# Patient Record
Sex: Female | Born: 1955 | Race: White | Hispanic: No | Marital: Married | Smoking: Former smoker
Health system: Southern US, Community
[De-identification: ages and names within clinical notes are randomized; demographics above are authoritative.]

## PROBLEM LIST (undated history)

## (undated) DIAGNOSIS — R51 Headache: Secondary | ICD-10-CM

## (undated) DIAGNOSIS — E785 Hyperlipidemia, unspecified: Secondary | ICD-10-CM

## (undated) DIAGNOSIS — Z8669 Personal history of other diseases of the nervous system and sense organs: Secondary | ICD-10-CM

## (undated) DIAGNOSIS — C801 Malignant (primary) neoplasm, unspecified: Secondary | ICD-10-CM

## (undated) DIAGNOSIS — G5600 Carpal tunnel syndrome, unspecified upper limb: Secondary | ICD-10-CM

## (undated) DIAGNOSIS — M1990 Unspecified osteoarthritis, unspecified site: Secondary | ICD-10-CM

## (undated) DIAGNOSIS — M2550 Pain in unspecified joint: Secondary | ICD-10-CM

## (undated) DIAGNOSIS — G4700 Insomnia, unspecified: Secondary | ICD-10-CM

## (undated) DIAGNOSIS — M2540 Effusion, unspecified joint: Secondary | ICD-10-CM

## (undated) DIAGNOSIS — J302 Other seasonal allergic rhinitis: Secondary | ICD-10-CM

## (undated) DIAGNOSIS — Z9889 Other specified postprocedural states: Secondary | ICD-10-CM

## (undated) HISTORY — PX: COLONOSCOPY: SHX174

## (undated) HISTORY — PX: BREAST EXCISIONAL BIOPSY: SUR124

## (undated) HISTORY — PX: OTHER SURGICAL HISTORY: SHX169

---
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
## 1976-07-03 DIAGNOSIS — C801 Malignant (primary) neoplasm, unspecified: Secondary | ICD-10-CM

## 1976-07-03 HISTORY — PX: ABDOMINAL HYSTERECTOMY: SHX81

## 1976-07-03 HISTORY — DX: Malignant (primary) neoplasm, unspecified: C80.1

## 1989-07-03 HISTORY — PX: OTHER SURGICAL HISTORY: SHX169

## 1997-07-03 HISTORY — PX: OTHER SURGICAL HISTORY: SHX169

## 1998-05-24 ENCOUNTER — Other Ambulatory Visit: Admission: RE | Admit: 1998-05-24 | Discharge: 1998-05-24 | Payer: Self-pay | Admitting: Obstetrics and Gynecology

## 1999-06-13 ENCOUNTER — Encounter: Admission: RE | Admit: 1999-06-13 | Discharge: 1999-06-13 | Payer: Self-pay | Admitting: *Deleted

## 1999-06-13 ENCOUNTER — Encounter: Payer: Self-pay | Admitting: *Deleted

## 1999-06-22 ENCOUNTER — Other Ambulatory Visit: Admission: RE | Admit: 1999-06-22 | Discharge: 1999-06-22 | Payer: Self-pay | Admitting: *Deleted

## 2000-06-18 ENCOUNTER — Encounter: Admission: RE | Admit: 2000-06-18 | Discharge: 2000-06-18 | Payer: Self-pay | Admitting: *Deleted

## 2000-06-18 ENCOUNTER — Encounter: Payer: Self-pay | Admitting: *Deleted

## 2000-07-27 ENCOUNTER — Other Ambulatory Visit: Admission: RE | Admit: 2000-07-27 | Discharge: 2000-07-27 | Payer: Self-pay | Admitting: *Deleted

## 2001-06-20 ENCOUNTER — Encounter: Payer: Self-pay | Admitting: *Deleted

## 2001-06-20 ENCOUNTER — Encounter: Admission: RE | Admit: 2001-06-20 | Discharge: 2001-06-20 | Payer: Self-pay | Admitting: *Deleted

## 2001-08-05 ENCOUNTER — Other Ambulatory Visit: Admission: RE | Admit: 2001-08-05 | Discharge: 2001-08-05 | Payer: Self-pay | Admitting: Obstetrics and Gynecology

## 2002-03-25 ENCOUNTER — Encounter: Admission: RE | Admit: 2002-03-25 | Discharge: 2002-03-25 | Payer: Self-pay | Admitting: *Deleted

## 2002-03-25 ENCOUNTER — Encounter: Payer: Self-pay | Admitting: *Deleted

## 2002-08-11 ENCOUNTER — Other Ambulatory Visit: Admission: RE | Admit: 2002-08-11 | Discharge: 2002-08-11 | Payer: Self-pay | Admitting: Obstetrics and Gynecology

## 2003-03-30 ENCOUNTER — Encounter: Admission: RE | Admit: 2003-03-30 | Discharge: 2003-03-30 | Payer: Self-pay | Admitting: *Deleted

## 2003-03-30 ENCOUNTER — Encounter: Payer: Self-pay | Admitting: *Deleted

## 2003-08-17 ENCOUNTER — Other Ambulatory Visit: Admission: RE | Admit: 2003-08-17 | Discharge: 2003-08-17 | Payer: Self-pay | Admitting: Obstetrics and Gynecology

## 2004-01-07 ENCOUNTER — Ambulatory Visit (HOSPITAL_COMMUNITY): Admission: RE | Admit: 2004-01-07 | Discharge: 2004-01-07 | Payer: Self-pay | Admitting: Family Medicine

## 2004-03-30 ENCOUNTER — Encounter: Admission: RE | Admit: 2004-03-30 | Discharge: 2004-03-30 | Payer: Self-pay | Admitting: *Deleted

## 2005-04-21 ENCOUNTER — Encounter: Admission: RE | Admit: 2005-04-21 | Discharge: 2005-04-21 | Payer: Self-pay | Admitting: Obstetrics and Gynecology

## 2005-05-12 ENCOUNTER — Ambulatory Visit (HOSPITAL_COMMUNITY): Admission: RE | Admit: 2005-05-12 | Discharge: 2005-05-12 | Payer: Self-pay | Admitting: Family Medicine

## 2005-09-07 ENCOUNTER — Ambulatory Visit (HOSPITAL_COMMUNITY): Admission: RE | Admit: 2005-09-07 | Discharge: 2005-09-07 | Payer: Self-pay | Admitting: Family Medicine

## 2005-09-11 ENCOUNTER — Other Ambulatory Visit: Admission: RE | Admit: 2005-09-11 | Discharge: 2005-09-11 | Payer: Self-pay | Admitting: Obstetrics and Gynecology

## 2006-04-24 ENCOUNTER — Encounter: Admission: RE | Admit: 2006-04-24 | Discharge: 2006-04-24 | Payer: Self-pay | Admitting: Obstetrics and Gynecology

## 2006-06-29 ENCOUNTER — Encounter: Admission: RE | Admit: 2006-06-29 | Discharge: 2006-06-29 | Payer: Self-pay | Admitting: Orthopedic Surgery

## 2006-07-03 HISTORY — PX: OTHER SURGICAL HISTORY: SHX169

## 2006-07-03 HISTORY — PX: APPENDECTOMY: SHX54

## 2006-11-05 ENCOUNTER — Encounter: Payer: Self-pay | Admitting: Obstetrics and Gynecology

## 2006-11-05 ENCOUNTER — Observation Stay (HOSPITAL_COMMUNITY): Admission: EM | Admit: 2006-11-05 | Discharge: 2006-11-06 | Payer: Self-pay | Admitting: Emergency Medicine

## 2006-11-05 ENCOUNTER — Encounter (INDEPENDENT_AMBULATORY_CARE_PROVIDER_SITE_OTHER): Payer: Self-pay | Admitting: Specialist

## 2007-04-26 ENCOUNTER — Encounter: Admission: RE | Admit: 2007-04-26 | Discharge: 2007-04-26 | Payer: Self-pay | Admitting: Obstetrics and Gynecology

## 2007-07-04 HISTORY — PX: BREAST SURGERY: SHX581

## 2007-07-04 HISTORY — PX: BREAST EXCISIONAL BIOPSY: SUR124

## 2007-11-04 ENCOUNTER — Encounter: Admission: RE | Admit: 2007-11-04 | Discharge: 2007-11-04 | Payer: Self-pay | Admitting: Otolaryngology

## 2008-04-28 ENCOUNTER — Encounter: Admission: RE | Admit: 2008-04-28 | Discharge: 2008-04-28 | Payer: Self-pay | Admitting: Obstetrics and Gynecology

## 2009-04-29 ENCOUNTER — Encounter: Admission: RE | Admit: 2009-04-29 | Discharge: 2009-04-29 | Payer: Self-pay | Admitting: Obstetrics and Gynecology

## 2009-08-06 ENCOUNTER — Ambulatory Visit (HOSPITAL_COMMUNITY): Admission: RE | Admit: 2009-08-06 | Discharge: 2009-08-06 | Payer: Self-pay | Admitting: Family Medicine

## 2010-05-04 ENCOUNTER — Encounter: Admission: RE | Admit: 2010-05-04 | Discharge: 2010-05-04 | Payer: Self-pay | Admitting: Obstetrics and Gynecology

## 2010-07-03 HISTORY — PX: OTHER SURGICAL HISTORY: SHX169

## 2010-11-01 ENCOUNTER — Ambulatory Visit (HOSPITAL_COMMUNITY)
Admission: RE | Admit: 2010-11-01 | Discharge: 2010-11-01 | Disposition: A | Discharge status: Home / Self Care | Payer: Medicare PPO | Source: Ambulatory Visit | Attending: Orthopedic Surgery | Admitting: Orthopedic Surgery

## 2010-11-01 DIAGNOSIS — M25669 Stiffness of unspecified knee, not elsewhere classified: Secondary | ICD-10-CM | POA: Insufficient documentation

## 2010-11-01 DIAGNOSIS — M6281 Muscle weakness (generalized): Secondary | ICD-10-CM | POA: Insufficient documentation

## 2010-11-01 DIAGNOSIS — R262 Difficulty in walking, not elsewhere classified: Secondary | ICD-10-CM | POA: Insufficient documentation

## 2010-11-01 DIAGNOSIS — M25569 Pain in unspecified knee: Secondary | ICD-10-CM | POA: Insufficient documentation

## 2010-11-01 DIAGNOSIS — M25559 Pain in unspecified hip: Secondary | ICD-10-CM | POA: Insufficient documentation

## 2010-11-01 DIAGNOSIS — IMO0001 Reserved for inherently not codable concepts without codable children: Secondary | ICD-10-CM | POA: Insufficient documentation

## 2010-11-02 ENCOUNTER — Ambulatory Visit (HOSPITAL_COMMUNITY): Payer: Self-pay | Admitting: Physical Therapy

## 2010-11-04 ENCOUNTER — Ambulatory Visit (HOSPITAL_COMMUNITY)
Admission: RE | Admit: 2010-11-04 | Discharge: 2010-11-04 | Disposition: A | Discharge status: Home / Self Care | Payer: Medicare PPO | Source: Ambulatory Visit | Attending: Family Medicine | Admitting: Family Medicine

## 2010-11-08 ENCOUNTER — Ambulatory Visit (HOSPITAL_COMMUNITY)
Admission: RE | Admit: 2010-11-08 | Discharge: 2010-11-08 | Disposition: A | Discharge status: Home / Self Care | Payer: Medicare PPO | Source: Ambulatory Visit | Attending: Family Medicine | Admitting: Family Medicine

## 2010-11-08 ENCOUNTER — Ambulatory Visit (HOSPITAL_COMMUNITY): Payer: Medicare PPO | Admitting: Physical Therapy

## 2010-11-09 ENCOUNTER — Ambulatory Visit (HOSPITAL_COMMUNITY): Payer: Medicare PPO | Admitting: Physical Therapy

## 2010-11-10 ENCOUNTER — Ambulatory Visit (HOSPITAL_COMMUNITY)
Admission: RE | Admit: 2010-11-10 | Discharge: 2010-11-10 | Disposition: A | Discharge status: Home / Self Care | Payer: Medicare PPO | Source: Ambulatory Visit | Attending: Orthopedic Surgery | Admitting: Orthopedic Surgery

## 2010-11-11 ENCOUNTER — Ambulatory Visit (HOSPITAL_COMMUNITY)
Admission: RE | Admit: 2010-11-11 | Discharge: 2010-11-11 | Disposition: A | Discharge status: Home / Self Care | Payer: Medicare PPO | Source: Ambulatory Visit | Attending: Orthopedic Surgery | Admitting: Orthopedic Surgery

## 2010-11-14 ENCOUNTER — Ambulatory Visit (HOSPITAL_COMMUNITY)
Admission: RE | Admit: 2010-11-14 | Discharge: 2010-11-14 | Disposition: A | Discharge status: Home / Self Care | Payer: Medicare PPO | Source: Ambulatory Visit | Attending: Family Medicine | Admitting: Family Medicine

## 2010-11-14 ENCOUNTER — Ambulatory Visit (HOSPITAL_COMMUNITY): Payer: Medicare PPO | Admitting: Physical Therapy

## 2010-11-16 ENCOUNTER — Ambulatory Visit (HOSPITAL_COMMUNITY): Payer: Medicare PPO | Admitting: Physical Therapy

## 2010-11-18 ENCOUNTER — Ambulatory Visit (HOSPITAL_COMMUNITY): Payer: Medicare PPO

## 2010-11-18 ENCOUNTER — Ambulatory Visit (HOSPITAL_COMMUNITY)
Admission: RE | Admit: 2010-11-18 | Discharge: 2010-11-18 | Disposition: A | Discharge status: Home / Self Care | Payer: Medicare PPO | Source: Ambulatory Visit | Attending: Family Medicine | Admitting: Family Medicine

## 2010-11-18 NOTE — Op Note (Signed)
NAMEWILLETTA, [name]              ACCOUNT NO.:  [id number]   MEDICAL RECORD NO.:  [id number]          PATIENT TYPE:  INP   LOCATION:  1341                         FACILITY:  [facility]   PHYSICIAN:  Sandria Bales. Ezzard Standing, M.D.  DATE OF BIRTH:  05-14-1956   DATE OF PROCEDURE:  11/05/2006  DATE OF DISCHARGE:                               OPERATIVE REPORT   PREOPERATIVE DIAGNOSIS:  Questionable appendicitis.   POSTOPERATIVE DIAGNOSIS:  Normal appendix with appendix scarred to right  ovary, the cecum adhered along right lateral abdominal wall, no obvious  abscess or inflammation.   PROCEDURE PERFORMED:  Laparoscopic appendectomy and enterolysis of  adhesions.   SURGEON:  Ovidio Kin, MD.   FIRST ASSISTANT:  None.   ANESTHESIA:  General endotracheal.   ESTIMATED BLOOD LOSS:  Minimal.   INDICATIONS FOR PROCEDURE:  The patient is a 55 year old white female  patient of Dr. Dennie Bible, who developed right lower quadrant  abdominal pain lasting about 3 days. She has had no real GI symptoms.  She had a CT scan today at [facility] and has what looks like a  focal inflammation with air in a pocket in the right lateral pelvic  brim. It is hard to tell on CT whether it is appendicitis or some other  pathology.   I discussed with the patient the options of observation versus surgery.  I think the patient would be best served by undergoing a laparoscopic  examination to make sure there is no obvious abscess or infection. I  discussed with her indication, potential complications. The potential  complications include, but are not limited to: bleeding, infection,  bowel injury and the possibility of open surgery.   PROCEDURE IN DETAIL:  The patient placed in the supine position. Her  left arm was tucked. She had a Foley catheter in place. She was given a  gram of cefoxitin at the initiation of the procedure. Her abdomen was  painted with Betadine solution and sterilely draped.   An  infraumbilical incision was made, with sharp dissection carried down  to the abdominal cavity. A 0-degree, 10-mm laparoscope was inserted  through a 12-mm Hassan trocar and the [facility] trocar secured with 0  Vicryl sutures. I made the 5-mm trocar on the right upper quadrant and a  10-mm trocar in the left lower quadrant and I then accessed the  abdominal cavity.   Her right and left lobes of the liver were unremarkable. Her gallbladder  had some scarring to it. It was otherwise unremarkable. He stomach was  unremarkable. In advancing the scope to the pelvis, she had her distal  right colon and cecum stuck along the right lateral pelvic wall with old  adhesions. There was no infection, inflammation or evidence of acute  inflammation. I then identified her appendix and traced it down to the  tip of the appendix to right on top of the right ovarian complex which  appeared atrophic. I took this off with a harmonic scalpel, then took  the mesentery of the appendix down to the base.   I then divided the appendix using a vascular  load of the Endo GIA 45-mm  Ethicon stapler. I placed the appendix in an Endo catch bag and  delivered it through the umbilicus. I thought the appendix was grossly  normal appearing except for the adhesions.   I then turned my attention to around the right ovary. I was able to  mobilize this complex and flip it back and forth. There was no evidence  of any infection, inflammation, or abscess. I was then able to mobilize  the small bowel, which actually was stuck down towards the vaginal cuff,  I took these adhesions down, was able to mobilize the small bowel up. I  could examine the whole right pelvic sidewall where this questionable  air was seen on the CT scan. I could see no evidence of anything that  looked like an abscess or a perforation around the small bowel, back at  least 4 or 5 feet, trying to make sure there was no evidence of a  Meckel's of the small  bowel. There was no evidence of any Crohn. The  small bowel had a gross normal appearance externally.   I saw no inflammatory cause of her pain. Again, I saw no abscess, no  infection or abnormality seen on the CT scan, I thought I had done a  thorough job of looking both at the cecum, the appendix which was now  gone, the terminal ileum and the right ovary.   I then irrigated with about 500 mL of saline. I removed my trocars in  turn. There was no bleeding at the trocar site. The umbilical port was  closed with a 0 Vicryl suture. The skin site was closed with 5-0  Monocryl suture, painted with tincture of benzoin and Steri-Strips.  Sponge and needle count were correct at the end of the case.   The patient tolerated the procedure well, was transported to the  recovery room in good condition.   Sponge and needle counts were correct at the end of the case.      Sandria Bales. Ezzard Standing, M.D.  Electronically Signed     DHN/MEDQ  D:  11/05/2006  T:  11/06/2006  Job:  [id number]   cc:   Marcelino Duster L. Vincente Poli, M.D.  Fax: [phone]   Patrica Duel, M.D.  Fax: [phone]

## 2010-11-18 NOTE — H&P (Signed)
[name], [name]              ACCOUNT NO.:  [id number]   MEDICAL RECORD NO.:  [id number]           PATIENT TYPE:   LOCATION:                               FACILITY:  [facility]   PHYSICIAN:  Sandria Bales. Ezzard Standing, M.D.  DATE OF BIRTH:  [birth year]   DATE OF ADMISSION:  11/05/2006  DATE OF DISCHARGE:                              HISTORY & PHYSICAL   HISTORY OF PRESENT ILLNESS:  This is a 55 year old white female who is a  patient of Dr. Elpidio Eric in [place].  She sees Dr. Marcelle Overlie in [place] from a GYN standpoint.  She developed right lower  quadrant pain on Friday, Nov 02, 2006.  She kind of tolerated her pain  through the weekend.  She had no nausea, no vomiting.  It hurts a little  bit more standing up than laying down flat.  She has had no fever.   She denies any history of peptic ulcer disease, liver disease,  gallbladder disease, pancreatic disease, or colon disease, and she had a  negative colonoscopy by Dr. Doy Mince in January of 2007.   PAST MEDICAL HISTORY:   ALLERGIES:  She has no allergies.   CURRENT MEDICATIONS:  1. Lyrica 75 mg b.i.d.  2. Trazodone 25 mg q.h.s.  3. Topamax 50 mg b.i.d. for migraine headaches.  4. Simvastatin 25 mg q.h.s. for cholesterol.  5. Enablex 15 mg daily for incontinence of urine.  6. On at least one note, she is on Macrobid.   PAST SURGICAL HISTORY:  1. She had a hysterectomy for cervical cancer in her 21s.  She does      have one child.  She did not get radiation.  She did not have her      ovaries removed.  2. She has had six knee operations on her right knee by Dr. Chaney Malling,      the most recent, I think in 1998.  She does have a cane she walks      with.  3. She has had a benign left breast biopsy by an unknown surgeon in      town in 1998.  4. She had a bladder tack by Dr. Alexis Frock in 2000.  5. On August 22, 2006 she had an operation for right hand/scaphoid      by Karlyne Greenspan of [facility].   REVIEW OF SYSTEMS:  NEUROLOGIC:  She has had migraine headaches for a  couple years, but the Topamax seems to do a really good job of  controlling those symptoms and she does not see a neurologist on any  regular basis.  PULMONARY:  She quit smoking some 15 years ago.  She has no lung  disease.  CARDIAC:  She has no heart disease, chest pain, or hypertension.  GASTROINTESTINAL:  See history of present illness.  UROLOGIC:  She has this bladder tack and still has some trouble with  urinary incontinence, it sounds like.   SOCIAL HISTORY:  She does not work.  Her husband is with her in the  emergency room.   PHYSICAL EXAMINATION:  VITAL SIGNS:  Her temperature is 97.1, blood  pressure 110/64, pulse 72, respirations 18.  GENERAL:  She is a well-nourished white female, alert and cooperative  with physical exam.  HEENT:  Unremarkable.  NECK:  Supple without masses or thyromegaly.  LUNGS:  Clear to auscultation with symmetric breath sounds.  HEART:  Heart has a regular rate and rhythm without murmur or rub.  BREASTS:  I did not examine her breasts.  ABDOMEN:  Soft.  She has active bowel sounds.  She has a well-healed  Pfannenstiel scar from prior hysterectomy.  She has no organomegaly.  No  hernia.  She is tender in her right lower quadrant,  in McBurney's area.  She has no guarding or rebound or peritoneal signs.  RECTAL:  I did not do a rectal exam  EXTREMITIES:  Has good strength all 4 extremities.  NEUROLOGIC:  Grossly intact.   Dr. Vincente Poli did talk to me on the phone about her impressions and  concerns about possible appendix and she was originally seen at [facility],  where she sent her over to [facility].   DIAGNOSES:  1. Right lower quadrant abdominal pain, possible appendicitis.  I      spoke to her and her husband about the options for continued      observation, possibly antibiotics, versus surgery.  I told them      that I thought there was a 60-70% chance this is  appendiciitis, but      there may be as much as a third of a chance that this may be not      appendicitis, but some other diagnosis, such as small bowel      perforation versus an ovarian problem.  The patient's sister died      of lung cancer.  She is extremely concerned about malignancy but I      do not see any evidence of malignancy on the CT scan or my physical      exam.   I told them that the options would be a laparoscope or possibly an open  surgery, probably would be 5-10%.  It depends on how much scar tissue  and adhesions she has from her prior operation.  I would take her  appendix out anyway.   She also understands she may get some bowel resected if I find some  other peculiar injury.   1. Recent hand surgery of her right hand.  2. Migraine headaches, well-controlled on Topamax.  3. Hypercholesterolemia.  4. Urinary incontinence.  5. Chronic right knee problems, followed by Dr. Dewaine Conger.      Sandria Bales. Ezzard Standing, M.D.  Electronically Signed     DHN/MEDQ  D:  11/05/2006  T:  11/05/2006  Job:  [id number]   cc:   Patrica Duel, M.D.  Fax: [phone]   Stann Mainland. Vincente Poli, M.D.  Fax: [phone]   Shirley Friar, MD  Fax: [phone]   Lenard Galloway. Chaney Malling, M.D.  Fax: [phone]

## 2010-11-21 ENCOUNTER — Ambulatory Visit (HOSPITAL_COMMUNITY)
Admission: RE | Admit: 2010-11-21 | Discharge: 2010-11-21 | Disposition: A | Discharge status: Home / Self Care | Payer: Medicare PPO | Source: Ambulatory Visit | Attending: Family Medicine | Admitting: Family Medicine

## 2010-11-23 ENCOUNTER — Ambulatory Visit (HOSPITAL_COMMUNITY)
Admission: RE | Admit: 2010-11-23 | Discharge: 2010-11-23 | Disposition: A | Discharge status: Home / Self Care | Payer: Medicare PPO | Source: Ambulatory Visit | Attending: Family Medicine | Admitting: Family Medicine

## 2010-11-25 ENCOUNTER — Ambulatory Visit (HOSPITAL_COMMUNITY)
Admission: RE | Admit: 2010-11-25 | Discharge: 2010-11-25 | Disposition: A | Discharge status: Home / Self Care | Payer: Medicare PPO | Source: Ambulatory Visit | Attending: Family Medicine | Admitting: Family Medicine

## 2010-11-25 DIAGNOSIS — M25569 Pain in unspecified knee: Secondary | ICD-10-CM | POA: Insufficient documentation

## 2010-11-25 DIAGNOSIS — R262 Difficulty in walking, not elsewhere classified: Secondary | ICD-10-CM | POA: Insufficient documentation

## 2010-11-25 DIAGNOSIS — M25559 Pain in unspecified hip: Secondary | ICD-10-CM | POA: Insufficient documentation

## 2010-11-25 DIAGNOSIS — IMO0001 Reserved for inherently not codable concepts without codable children: Secondary | ICD-10-CM | POA: Insufficient documentation

## 2010-11-25 DIAGNOSIS — M6281 Muscle weakness (generalized): Secondary | ICD-10-CM | POA: Insufficient documentation

## 2010-11-25 DIAGNOSIS — M25669 Stiffness of unspecified knee, not elsewhere classified: Secondary | ICD-10-CM | POA: Insufficient documentation

## 2010-11-29 ENCOUNTER — Ambulatory Visit (HOSPITAL_COMMUNITY)
Admission: RE | Admit: 2010-11-29 | Discharge: 2010-11-29 | Disposition: A | Discharge status: Home / Self Care | Payer: Medicare PPO | Source: Ambulatory Visit | Attending: Family Medicine | Admitting: Family Medicine

## 2010-12-01 ENCOUNTER — Ambulatory Visit (HOSPITAL_COMMUNITY): Payer: Medicare PPO | Admitting: Physical Therapy

## 2010-12-02 ENCOUNTER — Ambulatory Visit (HOSPITAL_COMMUNITY)
Admission: RE | Admit: 2010-12-02 | Discharge: 2010-12-02 | Disposition: A | Discharge status: Home / Self Care | Payer: Medicare PPO | Source: Ambulatory Visit | Attending: Family Medicine | Admitting: Family Medicine

## 2010-12-02 DIAGNOSIS — M6281 Muscle weakness (generalized): Secondary | ICD-10-CM | POA: Insufficient documentation

## 2010-12-02 DIAGNOSIS — M25569 Pain in unspecified knee: Secondary | ICD-10-CM | POA: Insufficient documentation

## 2010-12-02 DIAGNOSIS — M25559 Pain in unspecified hip: Secondary | ICD-10-CM | POA: Insufficient documentation

## 2010-12-02 DIAGNOSIS — R262 Difficulty in walking, not elsewhere classified: Secondary | ICD-10-CM | POA: Insufficient documentation

## 2010-12-02 DIAGNOSIS — M25669 Stiffness of unspecified knee, not elsewhere classified: Secondary | ICD-10-CM | POA: Insufficient documentation

## 2010-12-02 DIAGNOSIS — IMO0001 Reserved for inherently not codable concepts without codable children: Secondary | ICD-10-CM | POA: Insufficient documentation

## 2010-12-05 ENCOUNTER — Ambulatory Visit (HOSPITAL_COMMUNITY)
Admission: RE | Admit: 2010-12-05 | Discharge: 2010-12-05 | Disposition: A | Discharge status: Home / Self Care | Payer: Medicare PPO | Source: Ambulatory Visit | Attending: Family Medicine | Admitting: Family Medicine

## 2010-12-07 ENCOUNTER — Ambulatory Visit (HOSPITAL_COMMUNITY)
Admission: RE | Admit: 2010-12-07 | Discharge: 2010-12-07 | Disposition: A | Discharge status: Home / Self Care | Payer: Medicare PPO | Source: Ambulatory Visit | Attending: Family Medicine | Admitting: Family Medicine

## 2010-12-09 ENCOUNTER — Ambulatory Visit (HOSPITAL_COMMUNITY): Payer: Medicare PPO | Admitting: Physical Therapy

## 2010-12-13 ENCOUNTER — Inpatient Hospital Stay (HOSPITAL_COMMUNITY)
Admission: RE | Admit: 2010-12-13 | Discharge: 2010-12-13 | Disposition: A | Discharge status: Home / Self Care | Payer: Medicare PPO | Source: Ambulatory Visit | Attending: *Deleted | Admitting: *Deleted

## 2010-12-15 ENCOUNTER — Ambulatory Visit (HOSPITAL_COMMUNITY)
Admission: RE | Admit: 2010-12-15 | Discharge: 2010-12-15 | Disposition: A | Discharge status: Home / Self Care | Payer: Medicare PPO | Source: Ambulatory Visit | Attending: Family Medicine | Admitting: Family Medicine

## 2010-12-19 ENCOUNTER — Ambulatory Visit (HOSPITAL_COMMUNITY): Payer: Medicare PPO | Admitting: Physical Therapy

## 2010-12-20 ENCOUNTER — Ambulatory Visit (HOSPITAL_COMMUNITY)
Admission: RE | Admit: 2010-12-20 | Discharge: 2010-12-20 | Disposition: A | Discharge status: Home / Self Care | Payer: Medicare PPO | Source: Ambulatory Visit | Attending: Family Medicine | Admitting: Family Medicine

## 2010-12-21 ENCOUNTER — Ambulatory Visit (HOSPITAL_COMMUNITY): Payer: Medicare PPO | Admitting: Physical Therapy

## 2011-04-13 ENCOUNTER — Other Ambulatory Visit: Payer: Self-pay | Admitting: Obstetrics and Gynecology

## 2011-04-13 DIAGNOSIS — Z1231 Encounter for screening mammogram for malignant neoplasm of breast: Secondary | ICD-10-CM

## 2011-04-26 DIAGNOSIS — M659 Synovitis and tenosynovitis, unspecified: Secondary | ICD-10-CM | POA: Insufficient documentation

## 2011-05-11 ENCOUNTER — Ambulatory Visit
Admission: RE | Admit: 2011-05-11 | Discharge: 2011-05-11 | Disposition: A | Discharge status: Home / Self Care | Payer: Medicare PPO | Source: Ambulatory Visit | Attending: Obstetrics and Gynecology | Admitting: Obstetrics and Gynecology

## 2011-05-11 DIAGNOSIS — Z1231 Encounter for screening mammogram for malignant neoplasm of breast: Secondary | ICD-10-CM

## 2011-05-18 ENCOUNTER — Other Ambulatory Visit: Payer: Self-pay | Admitting: Obstetrics and Gynecology

## 2011-05-18 DIAGNOSIS — R928 Other abnormal and inconclusive findings on diagnostic imaging of breast: Secondary | ICD-10-CM

## 2011-05-24 ENCOUNTER — Ambulatory Visit
Admission: RE | Admit: 2011-05-24 | Discharge: 2011-05-24 | Disposition: A | Discharge status: Home / Self Care | Payer: Medicare PPO | Source: Ambulatory Visit | Attending: Obstetrics and Gynecology | Admitting: Obstetrics and Gynecology

## 2011-05-24 DIAGNOSIS — R928 Other abnormal and inconclusive findings on diagnostic imaging of breast: Secondary | ICD-10-CM

## 2011-06-01 ENCOUNTER — Other Ambulatory Visit: Payer: Medicare PPO

## 2011-11-06 DIAGNOSIS — G894 Chronic pain syndrome: Secondary | ICD-10-CM | POA: Insufficient documentation

## 2012-03-11 ENCOUNTER — Other Ambulatory Visit (HOSPITAL_COMMUNITY): Payer: Self-pay | Admitting: Obstetrics and Gynecology

## 2012-03-11 ENCOUNTER — Other Ambulatory Visit: Payer: Self-pay | Admitting: Obstetrics and Gynecology

## 2012-03-11 ENCOUNTER — Ambulatory Visit (HOSPITAL_COMMUNITY)
Admission: RE | Admit: 2012-03-11 | Discharge: 2012-03-11 | Disposition: A | Discharge status: Home / Self Care | Payer: Medicare Other | Source: Ambulatory Visit | Attending: Obstetrics and Gynecology | Admitting: Obstetrics and Gynecology

## 2012-03-11 DIAGNOSIS — Z87891 Personal history of nicotine dependence: Secondary | ICD-10-CM

## 2012-03-11 DIAGNOSIS — E7800 Pure hypercholesterolemia, unspecified: Secondary | ICD-10-CM | POA: Insufficient documentation

## 2012-03-11 DIAGNOSIS — Z801 Family history of malignant neoplasm of trachea, bronchus and lung: Secondary | ICD-10-CM | POA: Insufficient documentation

## 2012-03-11 DIAGNOSIS — Z0000 Encounter for general adult medical examination without abnormal findings: Secondary | ICD-10-CM | POA: Insufficient documentation

## 2012-04-15 ENCOUNTER — Other Ambulatory Visit: Payer: Self-pay | Admitting: Obstetrics and Gynecology

## 2012-04-15 DIAGNOSIS — Z1231 Encounter for screening mammogram for malignant neoplasm of breast: Secondary | ICD-10-CM

## 2012-06-03 ENCOUNTER — Ambulatory Visit
Admission: RE | Admit: 2012-06-03 | Discharge: 2012-06-03 | Disposition: A | Discharge status: Home / Self Care | Payer: Medicare Other | Source: Ambulatory Visit | Attending: Obstetrics and Gynecology | Admitting: Obstetrics and Gynecology

## 2012-06-03 DIAGNOSIS — Z1231 Encounter for screening mammogram for malignant neoplasm of breast: Secondary | ICD-10-CM

## 2012-06-04 ENCOUNTER — Encounter (HOSPITAL_COMMUNITY): Payer: Self-pay

## 2012-06-06 ENCOUNTER — Encounter (HOSPITAL_COMMUNITY)
Admission: RE | Admit: 2012-06-06 | Discharge: 2012-06-06 | Disposition: A | Discharge status: Home / Self Care | Payer: Medicare Other | Source: Ambulatory Visit | Attending: Orthopedic Surgery | Admitting: Orthopedic Surgery

## 2012-06-06 ENCOUNTER — Encounter (HOSPITAL_COMMUNITY): Payer: Self-pay

## 2012-06-06 HISTORY — DX: Insomnia, unspecified: G47.00

## 2012-06-06 HISTORY — DX: Other seasonal allergic rhinitis: J30.2

## 2012-06-06 HISTORY — DX: Effusion, unspecified joint: M25.40

## 2012-06-06 HISTORY — DX: Hyperlipidemia, unspecified: E78.5

## 2012-06-06 HISTORY — DX: Pain in unspecified joint: M25.50

## 2012-06-06 HISTORY — DX: Unspecified osteoarthritis, unspecified site: M19.90

## 2012-06-06 HISTORY — DX: Personal history of other diseases of the nervous system and sense organs: Z86.69

## 2012-06-06 LAB — TYPE AND SCREEN
ABO/RH(D): O POS
Antibody Screen: NEGATIVE

## 2012-06-06 LAB — COMPREHENSIVE METABOLIC PANEL
ALT: 11 U/L (ref 0–35)
AST: 17 U/L (ref 0–37)
Albumin: 4.1 g/dL (ref 3.5–5.2)
Calcium: 9.6 mg/dL (ref 8.4–10.5)
Sodium: 140 mEq/L (ref 135–145)
Total Protein: 7.4 g/dL (ref 6.0–8.3)

## 2012-06-06 LAB — URINE MICROSCOPIC-ADD ON

## 2012-06-06 LAB — ABO/RH: ABO/RH(D): O POS

## 2012-06-06 LAB — CBC
MCH: 28.7 pg (ref 26.0–34.0)
MCHC: 32.7 g/dL (ref 30.0–36.0)
Platelets: 336 10*3/uL (ref 150–400)

## 2012-06-06 LAB — SURGICAL PCR SCREEN
MRSA, PCR: NEGATIVE
Staphylococcus aureus: POSITIVE — AB

## 2012-06-06 LAB — URINALYSIS, ROUTINE W REFLEX MICROSCOPIC
Hgb urine dipstick: NEGATIVE
Protein, ur: NEGATIVE mg/dL
Urobilinogen, UA: 1 mg/dL (ref 0.0–1.0)

## 2012-06-06 NOTE — H&P (Signed)
  [facility] [address]   [address] , [place] [address] [phone] A Division of [facility]  Loreta Ave, M.D.   Robert A. Thurston Hole, M.D.   Burnell Blanks, M.D.   Eulas Post, M.D.   Lunette Stands, M.D Buford Dresser, M.D.  Charlsie Quest, M.D.   Estell Harpin, M.D.   Melina Fiddler, M.D. Genene Churn. Barry Dienes, PA-C            Kirstin A. 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 Symptoms are unchanged and she wants to proceed with total knee replacement as scheduled. Over the past 2 years she's been using Vicodin for pain and has concerns about pain control post-op. Current medications: Toprimate, Simvastatin, Trazodone, Hydrocodone. No known drug allergies. Past medical/surgical history: right knee arthroscopy hysterectomy bladder surgery left breast cyst excision right wrist surgery appendectomy endometriosis left knee arthroscopy hypercholesterolemia. Family history is positive for hypertension diabetes stroke and cancer. Social history: she's married quit smoking in 1998 denies alcohol use. Review of systems: is otherwise negative.   EXAMINATION: Height 5'6" weight 160 pounds. Temp 98 respirations 18, blood pressure 131/90 pulse is 75. Alert and oriented x3 in no acute distress. No increase in respiratory effort. Gait is antalgic. Alert and oriented x3 in no acute distress. Height 6' weight 250 pounds. Head is normal cephalic atraumatic. PERRLA and EOMI. Neck unremarkable. Lungs CTA bilaterally. No wheezes noted. Heart regular rate and rhythm No murmurs. Abdomen round non-distended. NABS x4. Soft non-tender. Left knee good range of motion small effusion joint line tenderness ligaments stable calf non-tender neurovascularly intact.  Skin warm and dry.  No increase in respiratory effort.   X-RAYS: MRI left knee shows AVN.  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  DISPOSITION: We'll proceed with left total knee replacement as scheduled. Discussed risks benefits and possible complications in detail. All questions answered.  Loreta Ave, M.D.  Electronically verified by Loreta Ave, M.D. DFM(JMO):kh D 06-03-12 T 06-03-12

## 2012-06-06 NOTE — Research (Signed)
Pt doesn't have a cardiologist  Denies ever having echo/heart cath/stress test  Dr.McGough is Medical MD  CXR in epic from 03/11/12 Denies ekg within last yr

## 2012-06-06 NOTE — Pre-Procedure Instructions (Signed)
20 SARAH BAEZ  06/06/2012   Your procedure is scheduled on:  Wed, Dec 11 @ 10:30 AM  Report to [facility] at 7:30 AM.  Call this number if you have problems the morning of surgery: [phone]   Remember:   Do not eat food:After Midnight.    Take these medicines the morning of surgery with A SIP OF WATER: Pain Pill(if needed) and Mucinex(Guaifenesin)   Do not wear jewelry, make-up or nail polish.  Do not wear lotions, powders, or perfumes. You may wear deodorant.  Do not shave 48 hours prior to surgery.  Do not bring valuables to the hospital.  Contacts, dentures or bridgework may not be worn into surgery.  Leave suitcase in the car. After surgery it may be brought to your room.  For patients admitted to the hospital, checkout time is 11:00 AM the day of discharge.   Patients discharged the day of surgery will not be allowed to drive home.    Special Instructions: Shower using CHG 2 nights before surgery and the night before surgery.  If you shower the day of surgery use CHG.  Use special wash - you have one bottle of CHG for all showers.  You should use approximately 1/3 of the bottle for each shower.   Please read over the following fact sheets that you were given: Pain Booklet, Coughing and Deep Breathing, Blood Transfusion Information, MRSA Information and Surgical Site Infection Prevention

## 2012-06-07 LAB — URINE CULTURE: Colony Count: 30000

## 2012-06-11 MED ORDER — CEFAZOLIN SODIUM-DEXTROSE 2-3 GM-% IV SOLR
2.0000 g | INTRAVENOUS | Status: AC
  Administered 2012-06-12: 2 g via INTRAVENOUS
  Filled 2012-06-11: qty 50

## 2012-06-12 ENCOUNTER — Inpatient Hospital Stay (HOSPITAL_COMMUNITY)
Admission: RE | Admit: 2012-06-12 | Discharge: 2012-06-14 | DRG: 470 | Disposition: A | Discharge status: Home Health | Payer: Medicare Other | Source: Ambulatory Visit | Attending: Orthopedic Surgery | Admitting: Orthopedic Surgery

## 2012-06-12 ENCOUNTER — Inpatient Hospital Stay (HOSPITAL_COMMUNITY): Payer: Medicare Other | Admitting: Anesthesiology

## 2012-06-12 ENCOUNTER — Inpatient Hospital Stay (HOSPITAL_COMMUNITY): Payer: Medicare Other

## 2012-06-12 ENCOUNTER — Encounter (HOSPITAL_COMMUNITY): Payer: Self-pay | Admitting: *Deleted

## 2012-06-12 ENCOUNTER — Encounter (HOSPITAL_COMMUNITY)
Admission: RE | Disposition: A | Discharge status: Home Health | Payer: Self-pay | Source: Ambulatory Visit | Attending: Orthopedic Surgery

## 2012-06-12 ENCOUNTER — Encounter (HOSPITAL_COMMUNITY): Payer: Self-pay | Admitting: General Practice

## 2012-06-12 ENCOUNTER — Encounter (HOSPITAL_COMMUNITY): Payer: Self-pay | Admitting: Anesthesiology

## 2012-06-12 DIAGNOSIS — Z01812 Encounter for preprocedural laboratory examination: Secondary | ICD-10-CM

## 2012-06-12 DIAGNOSIS — E7800 Pure hypercholesterolemia, unspecified: Secondary | ICD-10-CM | POA: Diagnosis present

## 2012-06-12 DIAGNOSIS — M1710 Unilateral primary osteoarthritis, unspecified knee: Principal | ICD-10-CM | POA: Diagnosis present

## 2012-06-12 DIAGNOSIS — Z87891 Personal history of nicotine dependence: Secondary | ICD-10-CM

## 2012-06-12 DIAGNOSIS — Z01810 Encounter for preprocedural cardiovascular examination: Secondary | ICD-10-CM

## 2012-06-12 DIAGNOSIS — M87059 Idiopathic aseptic necrosis of unspecified femur: Secondary | ICD-10-CM | POA: Diagnosis present

## 2012-06-12 DIAGNOSIS — Z79899 Other long term (current) drug therapy: Secondary | ICD-10-CM

## 2012-06-12 DIAGNOSIS — Z96659 Presence of unspecified artificial knee joint: Secondary | ICD-10-CM

## 2012-06-12 HISTORY — PX: TOTAL KNEE ARTHROPLASTY: SHX125

## 2012-06-12 HISTORY — DX: Headache: R51

## 2012-06-12 SURGERY — ARTHROPLASTY, KNEE, TOTAL
Anesthesia: General | Site: Knee | Laterality: Left | Wound class: Clean

## 2012-06-12 MED ORDER — METOCLOPRAMIDE HCL 10 MG PO TABS: 5.0000 mg | ORAL_TABLET | Freq: Three times a day (TID) | ORAL | Status: DC | PRN

## 2012-06-12 MED ORDER — SENNOSIDES-DOCUSATE SODIUM 8.6-50 MG PO TABS: 1.0000 | ORAL_TABLET | Freq: Every evening | ORAL | Status: DC | PRN

## 2012-06-12 MED ORDER — MORPHINE SULFATE 4 MG/ML IJ SOLN: INTRAMUSCULAR | Status: DC | PRN

## 2012-06-12 MED ORDER — HYDROMORPHONE HCL PF 1 MG/ML IJ SOLN
1.0000 mg | INTRAMUSCULAR | Status: DC | PRN
  Administered 2012-06-12 – 2012-06-13 (×7): 1 mg via INTRAVENOUS
  Filled 2012-06-12 (×7): qty 1

## 2012-06-12 MED ORDER — MIDAZOLAM HCL 2 MG/2ML IJ SOLN
4.0000 mg | Freq: Once | INTRAMUSCULAR | Status: AC
  Administered 2012-06-12: 4 mg via INTRAVENOUS

## 2012-06-12 MED ORDER — DOCUSATE SODIUM 100 MG PO CAPS
100.0000 mg | ORAL_CAPSULE | Freq: Two times a day (BID) | ORAL | Status: DC
  Filled 2012-06-12 (×4): qty 1

## 2012-06-12 MED ORDER — OXYCODONE HCL 5 MG PO TABS
5.0000 mg | ORAL_TABLET | Freq: Once | ORAL | Status: AC | PRN
  Administered 2012-06-12: 5 mg via ORAL

## 2012-06-12 MED ORDER — METOCLOPRAMIDE HCL 5 MG/ML IJ SOLN: 10.0000 mg | Freq: Once | INTRAMUSCULAR | Status: DC | PRN

## 2012-06-12 MED ORDER — METHOCARBAMOL 500 MG PO TABS
500.0000 mg | ORAL_TABLET | Freq: Four times a day (QID) | ORAL | Status: DC | PRN
  Administered 2012-06-12 – 2012-06-14 (×7): 500 mg via ORAL
  Filled 2012-06-12 (×8): qty 1

## 2012-06-12 MED ORDER — FENTANYL CITRATE 0.05 MG/ML IJ SOLN
100.0000 ug | Freq: Once | INTRAMUSCULAR | Status: AC
  Administered 2012-06-12: 100 ug via INTRAVENOUS

## 2012-06-12 MED ORDER — MORPHINE SULFATE 4 MG/ML IJ SOLN
INTRAMUSCULAR | Status: AC
  Filled 2012-06-12: qty 1

## 2012-06-12 MED ORDER — HYDROMORPHONE HCL PF 1 MG/ML IJ SOLN: 0.2500 mg | INTRAMUSCULAR | Status: DC | PRN

## 2012-06-12 MED ORDER — ARTIFICIAL TEARS OP OINT
TOPICAL_OINTMENT | OPHTHALMIC | Status: DC | PRN
  Administered 2012-06-12: 1 via OPHTHALMIC

## 2012-06-12 MED ORDER — PHENOL 1.4 % MT LIQD: 1.0000 | OROMUCOSAL | Status: DC | PRN

## 2012-06-12 MED ORDER — DEXAMETHASONE SODIUM PHOSPHATE 4 MG/ML IJ SOLN
INTRAMUSCULAR | Status: DC | PRN
  Administered 2012-06-12: 8 mg via INTRAVENOUS

## 2012-06-12 MED ORDER — SUMATRIPTAN SUCCINATE 100 MG PO TABS
100.0000 mg | ORAL_TABLET | ORAL | Status: DC | PRN
  Filled 2012-06-12: qty 1

## 2012-06-12 MED ORDER — OXYCODONE HCL 5 MG PO TABS
ORAL_TABLET | ORAL | Status: AC
  Filled 2012-06-12: qty 1

## 2012-06-12 MED ORDER — ROCURONIUM BROMIDE 100 MG/10ML IV SOLN
INTRAVENOUS | Status: DC | PRN
  Administered 2012-06-12: 40 mg via INTRAVENOUS

## 2012-06-12 MED ORDER — HYDROMORPHONE HCL PF 1 MG/ML IJ SOLN
INTRAMUSCULAR | Status: AC
  Filled 2012-06-12: qty 1

## 2012-06-12 MED ORDER — BUPIVACAINE HCL (PF) 0.25 % IJ SOLN: INTRAMUSCULAR | Status: DC | PRN

## 2012-06-12 MED ORDER — TOPIRAMATE 25 MG PO TABS
50.0000 mg | ORAL_TABLET | Freq: Two times a day (BID) | ORAL | Status: DC
  Filled 2012-06-12 (×5): qty 2

## 2012-06-12 MED ORDER — ACETAMINOPHEN 650 MG RE SUPP: 650.0000 mg | Freq: Four times a day (QID) | RECTAL | Status: DC | PRN

## 2012-06-12 MED ORDER — WARFARIN - PHARMACIST DOSING INPATIENT
Freq: Every day | Status: DC
  Administered 2012-06-12 – 2012-06-13 (×2)

## 2012-06-12 MED ORDER — LACTATED RINGERS IV SOLN
INTRAVENOUS | Status: DC
  Administered 2012-06-12: 10:00:00 via INTRAVENOUS

## 2012-06-12 MED ORDER — ONDANSETRON HCL 4 MG/2ML IJ SOLN
4.0000 mg | Freq: Four times a day (QID) | INTRAMUSCULAR | Status: DC | PRN
  Administered 2012-06-12: 4 mg via INTRAVENOUS
  Filled 2012-06-12 (×2): qty 2

## 2012-06-12 MED ORDER — ONDANSETRON HCL 4 MG/2ML IJ SOLN
INTRAMUSCULAR | Status: DC | PRN
  Administered 2012-06-12: 4 mg via INTRAVENOUS

## 2012-06-12 MED ORDER — FENTANYL CITRATE 0.05 MG/ML IJ SOLN
INTRAMUSCULAR | Status: AC
  Filled 2012-06-12: qty 4

## 2012-06-12 MED ORDER — OXYCODONE HCL 5 MG/5ML PO SOLN: 5.0000 mg | Freq: Once | ORAL | Status: AC | PRN

## 2012-06-12 MED ORDER — POTASSIUM CHLORIDE IN NACL 20-0.9 MEQ/L-% IV SOLN
INTRAVENOUS | Status: DC
  Filled 2012-06-12 (×6): qty 1000

## 2012-06-12 MED ORDER — BUPIVACAINE HCL (PF) 0.25 % IJ SOLN
INTRAMUSCULAR | Status: AC
  Filled 2012-06-12: qty 30

## 2012-06-12 MED ORDER — LIDOCAINE HCL (CARDIAC) 20 MG/ML IV SOLN
INTRAVENOUS | Status: DC | PRN
  Administered 2012-06-12: 100 mg via INTRAVENOUS

## 2012-06-12 MED ORDER — TOPIRAMATE 25 MG PO TABS: 50.0000 mg | ORAL_TABLET | Freq: Two times a day (BID) | ORAL | Status: DC

## 2012-06-12 MED ORDER — PROPOFOL 10 MG/ML IV BOLUS
INTRAVENOUS | Status: DC | PRN
  Administered 2012-06-12: 200 mg via INTRAVENOUS

## 2012-06-12 MED ORDER — HYDROCODONE-ACETAMINOPHEN 10-325 MG PO TABS
1.0000 | ORAL_TABLET | ORAL | Status: DC | PRN
  Administered 2012-06-12 – 2012-06-13 (×5): 2 via ORAL
  Filled 2012-06-12 (×6): qty 2

## 2012-06-12 MED ORDER — BISACODYL 10 MG RE SUPP: 10.0000 mg | Freq: Every day | RECTAL | Status: DC | PRN

## 2012-06-12 MED ORDER — METHOCARBAMOL 100 MG/ML IJ SOLN
500.0000 mg | Freq: Four times a day (QID) | INTRAVENOUS | Status: DC | PRN
  Administered 2012-06-12: 500 mg via INTRAVENOUS
  Filled 2012-06-12 (×2): qty 5

## 2012-06-12 MED ORDER — SIMVASTATIN 20 MG PO TABS
20.0000 mg | ORAL_TABLET | Freq: Every evening | ORAL | Status: DC
  Filled 2012-06-12 (×3): qty 1

## 2012-06-12 MED ORDER — SODIUM CHLORIDE 0.9 % IR SOLN
Status: DC | PRN
  Administered 2012-06-12: 3000 mL

## 2012-06-12 MED ORDER — BUPIVACAINE-EPINEPHRINE PF 0.5-1:200000 % IJ SOLN
INTRAMUSCULAR | Status: DC | PRN
  Administered 2012-06-12: 30 mL

## 2012-06-12 MED ORDER — MIDAZOLAM HCL 2 MG/2ML IJ SOLN
INTRAMUSCULAR | Status: AC
  Filled 2012-06-12: qty 4

## 2012-06-12 MED ORDER — HYDROMORPHONE HCL PF 1 MG/ML IJ SOLN
INTRAMUSCULAR | Status: AC
  Administered 2012-06-12: 1 mg via INTRAVENOUS
  Filled 2012-06-12: qty 1

## 2012-06-12 MED ORDER — TRAZODONE HCL 100 MG PO TABS
100.0000 mg | ORAL_TABLET | Freq: Every day | ORAL | Status: DC
  Filled 2012-06-12 (×3): qty 1

## 2012-06-12 MED ORDER — DIAZEPAM 5 MG PO TABS: 5.0000 mg | ORAL_TABLET | Freq: Two times a day (BID) | ORAL | Status: DC | PRN

## 2012-06-12 MED ORDER — ALUM & MAG HYDROXIDE-SIMETH 200-200-20 MG/5ML PO SUSP: 30.0000 mL | ORAL | Status: DC | PRN

## 2012-06-12 MED ORDER — ENOXAPARIN SODIUM 30 MG/0.3ML ~~LOC~~ SOLN
30.0000 mg | Freq: Two times a day (BID) | SUBCUTANEOUS | Status: DC
  Administered 2012-06-13 – 2012-06-14 (×3): 30 mg via SUBCUTANEOUS
  Filled 2012-06-12 (×5): qty 0.3

## 2012-06-12 MED ORDER — WARFARIN SODIUM 5 MG PO TABS
5.0000 mg | ORAL_TABLET | Freq: Once | ORAL | Status: AC
  Administered 2012-06-12: 5 mg via ORAL
  Filled 2012-06-12: qty 1

## 2012-06-12 MED ORDER — LIDOCAINE HCL 4 % MT SOLN
OROMUCOSAL | Status: DC | PRN
  Administered 2012-06-12: 4 mL via TOPICAL

## 2012-06-12 MED ORDER — NEOSTIGMINE METHYLSULFATE 1 MG/ML IJ SOLN
INTRAMUSCULAR | Status: DC | PRN
  Administered 2012-06-12: 3 mg via INTRAVENOUS

## 2012-06-12 MED ORDER — ONDANSETRON HCL 4 MG PO TABS
4.0000 mg | ORAL_TABLET | Freq: Four times a day (QID) | ORAL | Status: DC | PRN
  Administered 2012-06-13: 4 mg via ORAL
  Filled 2012-06-12: qty 1

## 2012-06-12 MED ORDER — COUMADIN BOOK
Freq: Once | Status: AC
  Administered 2012-06-12: 15:00:00
  Filled 2012-06-12: qty 1

## 2012-06-12 MED ORDER — GUAIFENESIN ER 600 MG PO TB12
600.0000 mg | ORAL_TABLET | Freq: Two times a day (BID) | ORAL | Status: DC
  Filled 2012-06-12 (×5): qty 1

## 2012-06-12 MED ORDER — WARFARIN VIDEO: Freq: Once | Status: DC

## 2012-06-12 MED ORDER — HYDROMORPHONE HCL PF 1 MG/ML IJ SOLN
INTRAMUSCULAR | Status: AC
  Administered 2012-06-12: 0.5 mg via INTRAVENOUS
  Filled 2012-06-12: qty 1

## 2012-06-12 MED ORDER — LACTATED RINGERS IV SOLN
INTRAVENOUS | Status: DC | PRN
  Administered 2012-06-12 (×3): via INTRAVENOUS

## 2012-06-12 MED ORDER — METOCLOPRAMIDE HCL 5 MG/ML IJ SOLN: 5.0000 mg | Freq: Three times a day (TID) | INTRAMUSCULAR | Status: DC | PRN

## 2012-06-12 MED ORDER — HYDROMORPHONE HCL PF 1 MG/ML IJ SOLN
0.2500 mg | INTRAMUSCULAR | Status: DC | PRN
  Administered 2012-06-12 (×6): 0.5 mg via INTRAVENOUS

## 2012-06-12 MED ORDER — BUPIVACAINE LIPOSOME 1.3 % IJ SUSP
INTRAMUSCULAR | Status: DC | PRN
  Administered 2012-06-12: 35 mL

## 2012-06-12 MED ORDER — FENTANYL CITRATE 0.05 MG/ML IJ SOLN
INTRAMUSCULAR | Status: DC | PRN
  Administered 2012-06-12 (×3): 50 ug via INTRAVENOUS
  Administered 2012-06-12: 150 ug via INTRAVENOUS
  Administered 2012-06-12: 50 ug via INTRAVENOUS

## 2012-06-12 MED ORDER — ACETAMINOPHEN 325 MG PO TABS: 650.0000 mg | ORAL_TABLET | Freq: Four times a day (QID) | ORAL | Status: DC | PRN

## 2012-06-12 MED ORDER — GLYCOPYRROLATE 0.2 MG/ML IJ SOLN
INTRAMUSCULAR | Status: DC | PRN
  Administered 2012-06-12: 0.4 mg via INTRAVENOUS

## 2012-06-12 MED ORDER — MENTHOL 3 MG MT LOZG: 1.0000 | LOZENGE | OROMUCOSAL | Status: DC | PRN

## 2012-06-12 SURGICAL SUPPLY — 56 items
BANDAGE ESMARK 6X9 LF (GAUZE/BANDAGES/DRESSINGS) ×1 IMPLANT
BLADE SAG 18X100X1.27 (BLADE) ×4 IMPLANT
BNDG CMPR 9X6 STRL LF SNTH (GAUZE/BANDAGES/DRESSINGS) ×1
BNDG ESMARK 6X9 LF (GAUZE/BANDAGES/DRESSINGS) ×2
BOOTCOVER CLEANROOM LRG (PROTECTIVE WEAR) ×4 IMPLANT
BOWL SMART MIX CTS (DISPOSABLE) ×2 IMPLANT
CEMENT BONE SIMPLEX SPEEDSET (Cement) ×4 IMPLANT
CLOTH BEACON ORANGE TIMEOUT ST (SAFETY) ×2 IMPLANT
COVER BACK TABLE 24X17X13 BIG (DRAPES) ×2 IMPLANT
COVER SURGICAL LIGHT HANDLE (MISCELLANEOUS) ×2 IMPLANT
CUFF TOURNIQUET SINGLE 34IN LL (TOURNIQUET CUFF) ×2 IMPLANT
DRAPE EXTREMITY T 121X128X90 (DRAPE) ×2 IMPLANT
DRAPE PROXIMA HALF (DRAPES) ×2 IMPLANT
DRAPE U-SHAPE 47X51 STRL (DRAPES) ×2 IMPLANT
DRSG PAD ABDOMINAL 8X10 ST (GAUZE/BANDAGES/DRESSINGS) ×2 IMPLANT
DURAPREP 26ML APPLICATOR (WOUND CARE) ×2 IMPLANT
ELECT CAUTERY BLADE 6.4 (BLADE) ×2 IMPLANT
ELECT REM PT RETURN 9FT ADLT (ELECTROSURGICAL) ×2
ELECTRODE REM PT RTRN 9FT ADLT (ELECTROSURGICAL) ×1 IMPLANT
EVACUATOR 1/8 PVC DRAIN (DRAIN) ×2 IMPLANT
FACESHIELD LNG OPTICON STERILE (SAFETY) ×2 IMPLANT
GAUZE XEROFORM 5X9 LF (GAUZE/BANDAGES/DRESSINGS) ×2 IMPLANT
GLOVE BIOGEL PI IND STRL 8 (GLOVE) ×1 IMPLANT
GLOVE BIOGEL PI INDICATOR 8 (GLOVE) ×1
GLOVE ORTHO TXT STRL SZ7.5 (GLOVE) ×2 IMPLANT
GOWN PREVENTION PLUS XLARGE (GOWN DISPOSABLE) ×4 IMPLANT
GOWN STRL NON-REIN LRG LVL3 (GOWN DISPOSABLE) ×4 IMPLANT
GOWN STRL REIN 2XL XLG LVL4 (GOWN DISPOSABLE) ×2 IMPLANT
HANDPIECE INTERPULSE COAX TIP (DISPOSABLE) ×2
IMMOBILIZER KNEE 22 UNIV (SOFTGOODS) ×2 IMPLANT
IMMOBILIZER KNEE 24 THIGH 36 (MISCELLANEOUS) IMPLANT
IMMOBILIZER KNEE 24 UNIV (MISCELLANEOUS)
KIT BASIN OR (CUSTOM PROCEDURE TRAY) ×2 IMPLANT
KIT ROOM TURNOVER OR (KITS) ×2 IMPLANT
MANIFOLD NEPTUNE II (INSTRUMENTS) ×2 IMPLANT
NS IRRIG 1000ML POUR BTL (IV SOLUTION) ×2 IMPLANT
PACK TOTAL JOINT (CUSTOM PROCEDURE TRAY) ×2 IMPLANT
PAD ARMBOARD 7.5X6 YLW CONV (MISCELLANEOUS) ×4 IMPLANT
PAD CAST 4YDX4 CTTN HI CHSV (CAST SUPPLIES) ×1 IMPLANT
PADDING CAST COTTON 4X4 STRL (CAST SUPPLIES) ×2
PADDING CAST COTTON 6X4 STRL (CAST SUPPLIES) ×2 IMPLANT
RUBBERBAND STERILE (MISCELLANEOUS) ×2 IMPLANT
SET HNDPC FAN SPRY TIP SCT (DISPOSABLE) ×1 IMPLANT
SPONGE GAUZE 4X4 12PLY (GAUZE/BANDAGES/DRESSINGS) ×2 IMPLANT
STAPLER VISISTAT 35W (STAPLE) ×2 IMPLANT
SUCTION FRAZIER TIP 10 FR DISP (SUCTIONS) ×2 IMPLANT
SUT VIC AB 1 CTX 36 (SUTURE) ×4
SUT VIC AB 1 CTX36XBRD ANBCTR (SUTURE) ×2 IMPLANT
SUT VIC AB 2-0 CT1 27 (SUTURE) ×4
SUT VIC AB 2-0 CT1 TAPERPNT 27 (SUTURE) ×2 IMPLANT
SYR 30ML LL (SYRINGE) ×2 IMPLANT
SYR 30ML SLIP (SYRINGE) ×2 IMPLANT
TOWEL OR 17X24 6PK STRL BLUE (TOWEL DISPOSABLE) ×2 IMPLANT
TOWEL OR 17X26 10 PK STRL BLUE (TOWEL DISPOSABLE) ×2 IMPLANT
TRAY FOLEY CATH 14FR (SET/KITS/TRAYS/PACK) ×2 IMPLANT
WATER STERILE IRR 1000ML POUR (IV SOLUTION) ×6 IMPLANT

## 2012-06-12 NOTE — Anesthesia Procedure Notes (Addendum)
Anesthesia Regional Block:  Femoral nerve block  Pre-Anesthetic Checklist: ,, timeout performed, Correct Patient, Correct Site, Correct Laterality, Correct Procedure,, site marked, risks and benefits discussed, Surgical consent,  Pre-op evaluation,  At surgeon's request and post-op pain management  Laterality: Left  Prep: chloraprep       Needles:  Injection technique: Single-shot  Needle Type: Echogenic Stimulator Needle     Needle Length: 9cm  Needle Gauge: 21    Additional Needles:  Procedures: nerve stimulator Femoral nerve block  Nerve Stimulator or Paresthesia:  Response: Quadriceps muscle contraction, 0.45 mA,   Additional Responses:   Narrative:  Start time: 06/12/2012 9:13 AM End time: 06/12/2012 9:29 AM Injection made incrementally with aspirations every 5 mL.  Performed by: Personally  Anesthesiologist: Dr Chaney Malling  Additional Notes: Functioning IV was confirmed and monitors were applied.  A 90mm 21ga Arrow echogenic stimulator needle was used. Sterile prep and drape,hand hygiene and sterile gloves were used.  Negative aspiration and negative test dose prior to incremental administration of local anesthetic. The patient tolerated the procedure well.    Femoral nerve block Procedure Name: Intubation Date/Time: 06/12/2012 9:58 AM Performed by: Romie Minus K Pre-anesthesia Checklist: Patient identified, Emergency Drugs available, Suction available, Patient being monitored and Timeout performed Patient Re-evaluated:Patient Re-evaluated prior to inductionOxygen Delivery Method: Circle system utilized Preoxygenation: Pre-oxygenation with 100% oxygen Intubation Type: IV induction Ventilation: Mask ventilation without difficulty Laryngoscope Size: Mac, 3, Miller and 2 Grade View: Grade I Tube type: Oral Tube size: 7.0 mm Number of attempts: 1 Airway Equipment and Method: Stylet and LTA kit utilized Placement Confirmation: ETT inserted through vocal cords  under direct vision,  positive ETCO2 and breath sounds checked- equal and bilateral Secured at: 21 cm Tube secured with: Tape Dental Injury: Teeth and Oropharynx as per pre-operative assessment  Comments: DL x 1 MAC 3 by Theodoro Grist, paramedic student. Place LTA. Unable to pass ETT. DL x 1 by [facility] CRNA. Grqade 1 view. Atraumatic oral intubation. +ETCO2. bbse.

## 2012-06-12 NOTE — Preoperative (Signed)
Beta Blockers   Reason not to administer Beta Blockers:Not Applicable 

## 2012-06-12 NOTE — Progress Notes (Signed)
P.A. Aware that pt is concerned about pain control. He has spoken with her in PACU, and we will continue to assess pain and notify MD of any problems

## 2012-06-12 NOTE — Anesthesia Postprocedure Evaluation (Signed)
Anesthesia Post Note  Patient: [name]  Procedure(s) Performed: Procedure(s) (LRB): TOTAL KNEE ARTHROPLASTY (Left)  Anesthesia type: general  Patient location: PACU  Post pain: Pain level controlled  Post assessment: Patient's Cardiovascular Status Stable  Last Vitals:  Filed Vitals:   06/12/12 1300  BP: 107/57  Pulse: 85  Temp:   Resp: 16    Post vital signs: Reviewed and stable  Level of consciousness: sedated  Complications: No apparent anesthesia complications

## 2012-06-12 NOTE — Progress Notes (Signed)
ANTICOAGULATION CONSULT NOTE - Initial Consult  Pharmacy Consult for Coumadin Indication: VTE prophylaxis  Allergies  Allergen Reactions  . Mupirocin     Bumps on tongue and thickness to tongue    Vital Signs: Temp: 97.4 F (36.3 C) (12/11 1353) Temp src: Oral (12/11 0755) BP: 113/65 mmHg (12/11 1353) Pulse Rate: 67  (12/11 1353)  Medical History: Past Medical History  Diagnosis Date  . Hyperlipidemia     takes Simvastatin nightly  . Seasonal allergies     in spring  . History of migraine     last one was on 06/02/12;takes Topamax bid and Imitrex prn  . Arthritis   . Joint pain   . Joint swelling   . Insomnia     takes Trazodone nightly    Assessment: 56yo female being started on coumadin for VTE prophylaxis s/p TKA. Pre-op Hgb 13.7 & baseline INR 0.9 on 06/06/12. Will also be started on prophylactic dosing on Lovenox tomorrow morning until INR > 1.8.   Goal of Therapy:  INR 2-3 Monitor platelets by anticoagulation protocol: Yes   Plan:  1) Coumadin 5mg  PO x 1  2) Follow-up INR daily  3) Education - book & video   Benjaman Pott, PharmD    06/12/2012   3:02 PM

## 2012-06-12 NOTE — Brief Op Note (Signed)
06/12/2012  1:25 PM  PATIENT:  [name]  56 y.o. female  PRE-OPERATIVE DIAGNOSIS:  osteoarthritis left knee  POST-OPERATIVE DIAGNOSIS:  osteoarthritis left knee  PROCEDURE:  Procedure(s) (LRB) with comments: TOTAL KNEE ARTHROPLASTY (Left)  SURGEON:  Surgeon(s) and Role:    * Loreta Ave, MD - Primary  PHYSICIAN ASSISTANT: Zonia Kief M    ANESTHESIA:   regional and general  EBL:  Total I/O In: 2000 [I.V.:2000] Out: 175 [Urine:100; Blood:75]   SPECIMEN:  No Specimen  DISPOSITION OF SPECIMEN:  N/A  COUNTS:  YES  TOURNIQUET:  * Missing tourniquet times found for documented tourniquets in log:  [id number] *  PATIENT DISPOSITION:  PACU - hemodynamically stable.

## 2012-06-12 NOTE — Evaluation (Signed)
Physical Therapy Evaluation Patient Details Name: [name] MRN: [id number] DOB: Aug 01, 1955 Today's Date: 06/12/2012 Time: [id number] PT Time Calculation (min): 32 min  PT Assessment / Plan / Recommendation Clinical Impression  56 y.o. 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  She presents today (op day) with decreased ROM, strength, balance, mobility and decreased ability to walk.  She will benefit from acute PT during her hospital stay and HHPT at f/u.    PT Assessment  Patient needs continued PT services    Follow Up Recommendations  Home health PT;Supervision - Intermittent    Does the patient have the potential to tolerate intense rehabilitation    NA  Barriers to Discharge None none    Equipment Recommendations  None recommended by PT    Recommendations for Other Services   none  Frequency 7X/week    Precautions / Restrictions Precautions Precautions: Knee Required Braces or Orthoses: Knee Immobilizer - Left Knee Immobilizer - Left: On except when in CPM Restrictions Weight Bearing Restrictions: Yes LLE Weight Bearing: Weight bearing as tolerated   Pertinent Vitals/Pain 5/10 pain left knee while in CPM prior to therapy, increased knee pain with gait, RN informed.        Mobility  Bed Mobility Bed Mobility: Supine to Sit;Sitting - Scoot to Edge of Bed Supine to Sit: 6: Modified independent (Device/Increase time);HOB elevated;With rails Sitting - Scoot to Edge of Bed: 6: Modified independent (Device/Increase time);With rail Details for Bed Mobility Assistance: heavy reliance on rails, but pt able to move left leg to EOB by herself.   Transfers Transfers: Sit to Stand;Stand to Sit Sit to Stand: 4: Min assist;From elevated surface;With upper extremity assist Stand to Sit: 4: Min assist;With upper extremity assist;To elevated surface;To chair/3-in-1 Details for Transfer Assistance: min assist to  support trunk during transfers, min verbal cues for hand placment  Ambulation/Gait Ambulation/Gait Assistance: 3: Mod assist Ambulation Distance (Feet): 10 Feet Assistive device: Rolling walker Ambulation/Gait Assistance Details: mod assist for the first few steps then min assist due to pt needing support when WB on left leg during gait.  Verbal cues for leg seqeuncing.   Gait Pattern: Step-to pattern;Antalgic       Exercises  encouraged ankle pumps for antiemb   PT Diagnosis: Difficulty walking;Abnormality of gait;Generalized weakness;Acute pain  PT Problem List: Decreased strength;Decreased range of motion;Decreased activity tolerance;Decreased balance;Decreased mobility;Decreased knowledge of use of DME;Decreased knowledge of precautions;Pain PT Treatment Interventions: DME instruction;Gait training;Stair training;Functional mobility training;Therapeutic activities;Therapeutic exercise;Balance training;Neuromuscular re-education;Patient/family education;Modalities   PT Goals Acute Rehab PT Goals PT Goal Formulation: With patient Time For Goal Achievement: 06/19/12 Potential to Achieve Goals: Good Pt will go Supine/Side to Sit: Independently;with HOB 0 degrees PT Goal: Supine/Side to Sit - Progress: Goal set today Pt will go Sit to Supine/Side: Independently;with HOB 0 degrees PT Goal: Sit to Supine/Side - Progress: Goal set today Pt will go Sit to Stand: with supervision;with upper extremity assist PT Goal: Sit to Stand - Progress: Goal set today Pt will go Stand to Sit: with supervision;with upper extremity assist PT Goal: Stand to Sit - Progress: Goal set today Pt will Transfer Bed to Chair/Chair to Bed: with supervision PT Transfer Goal: Bed to Chair/Chair to Bed - Progress: Goal set today Pt will Ambulate: >150 feet;with supervision;with rolling walker PT Goal: Ambulate - Progress: Goal set today Pt will Go Up / Down Stairs: 6-9 stairs;with min assist;with rail(s) PT Goal:  Up/Down  Stairs - Progress: Goal set today  Visit Information  Last PT Received On: 06/12/12 Assistance Needed: +1    Subjective Data  Subjective: Pt reports that her right knee was worse than her left.   Patient Stated Goal: to go home, manage pain well.   Prior Functioning  Home Living Lives With: Spouse Available Help at Discharge: Family;Available 24 hours/day Type of Home: House Home Access: Stairs to enter [employer] of Steps: 9 Entrance Stairs-Rails: Right;Left;Can reach both Home Layout: One level Bathroom Shower/Tub: Walk-in Contractor: Standard Bathroom Accessibility: Yes How Accessible: Accessible via walker Home Adaptive Equipment: Walker - rolling;Bedside commode/3-in-1;Grab bars in shower;Built-in shower seat;Hand-held shower hose;Straight cane Additional Comments: h/o R partial TKA Right leg (>10 years ago) Prior Function Level of Independence: Needs assistance Needs Assistance: Light Housekeeping Light Housekeeping: Other (comment) (has someone come in once a week) Able to Take Stairs?: Yes Driving: Yes Vocation: On disability Communication Communication: No difficulties Dominant Hand: Right    Cognition  Overall Cognitive Status: Appears within functional limits for tasks assessed/performed Arousal/Alertness: Awake/alert    Extremity/Trunk Assessment Right Lower Extremity Assessment RLE ROM/Strength/Tone: Deficits RLE ROM/Strength/Tone Deficits: decreased right knee flexion, strength seems WNL for tasks assessed Left Lower Extremity Assessment LLE ROM/Strength/Tone: Deficits LLE ROM/Strength/Tone Deficits: limited by pain and post anesthesia status.  Ankle 4/5, knee NT, hip 3-/5 flexion   Balance Static Sitting Balance Static Sitting - Balance Support: Bilateral upper extremity supported;Feet supported Static Sitting - Level of Assistance: 5: Stand by assistance  End of Session PT - End of Session Equipment Utilized  During Treatment: Left knee immobilizer Activity Tolerance: Patient limited by pain Patient left: in chair;with call bell/phone within reach CPM Left Knee CPM Left Knee: Off Left Knee Flexion (Degrees): 60  Left Knee Extension (Degrees): 0  Additional Comments: off at 16:30       Kadden Osterhout B. Deon Duer, PT, DPT [phone]   06/12/2012, 5:26 PM

## 2012-06-12 NOTE — Interval H&P Note (Signed)
History and Physical Interval Note:  06/12/2012 8:03 AM  [name]  has presented today for surgery, with the diagnosis of osteoarthritis left knee  The various methods of treatment have been discussed with the patient and family. After consideration of risks, benefits and other options for treatment, the patient has consented to  Procedure(s) (LRB) with comments: TOTAL KNEE ARTHROPLASTY (Left) as a surgical intervention .  The patient's history has been reviewed, patient examined, no change in status, stable for surgery.  I have reviewed the patient's chart and labs.  Questions were answered to the patient's satisfaction.     Auguste Tebbetts F

## 2012-06-12 NOTE — Transfer of Care (Signed)
Immediate Anesthesia Transfer of Care Note  Patient: [name]  Procedure(s) Performed: Procedure(s) (LRB) with comments: TOTAL KNEE ARTHROPLASTY (Left)  Patient Location: PACU  Anesthesia Type:General  Level of Consciousness: awake, alert  and oriented  Airway & Oxygen Therapy: Patient Spontanous Breathing and Patient connected to nasal cannula oxygen  Post-op Assessment: Report given to PACU RN and Post -op Vital signs reviewed and stable  Post vital signs: Reviewed and stable  Complications: No apparent anesthesia complications

## 2012-06-12 NOTE — Anesthesia Preprocedure Evaluation (Addendum)
Anesthesia Evaluation  Patient identified by MRN, date of birth, ID band Patient awake    Reviewed: Allergy & Precautions, H&P , NPO status , Patient's Chart, lab work & pertinent test results  History of Anesthesia Complications Negative for: history of anesthetic complications  Airway Mallampati: II TM Distance: >3 FB Neck ROM: Full    Dental  (+) Teeth Intact and Dental Advisory Given   Pulmonary neg pulmonary ROS,  breath sounds clear to auscultation        Cardiovascular negative cardio ROS  Rhythm:Regular Rate:Normal     Neuro/Psych  Headaches, Anxiety    GI/Hepatic negative GI ROS, Neg liver ROS,   Endo/Other  negative endocrine ROS  Renal/GU      Musculoskeletal   Abdominal   Peds  Hematology   Anesthesia Other Findings   Reproductive/Obstetrics                          Anesthesia Physical Anesthesia Plan  ASA: II  Anesthesia Plan: General   Post-op Pain Management:    Induction: Intravenous  Airway Management Planned: Oral ETT  Additional Equipment:   Intra-op Plan:   Post-operative Plan: Extubation in OR  Informed Consent: I have reviewed the patients History and Physical, chart, labs and discussed the procedure including the risks, benefits and alternatives for the proposed anesthesia with the patient or authorized representative who has indicated his/her understanding and acceptance.     Plan Discussed with: CRNA  Anesthesia Plan Comments:         Anesthesia Quick Evaluation

## 2012-06-12 NOTE — Progress Notes (Signed)
Orthopedic Tech Progress Note Patient Details:  [name] 1956/02/02 [id number]  CPM Left Knee CPM Left Knee: On Left Knee Flexion (Degrees): 60  Left Knee Extension (Degrees): 0  Additional Comments: TRAPEZE BAR   Shawnie Pons 06/12/2012, 1:30 PM

## 2012-06-13 LAB — CBC
HCT: 34 % — ABNORMAL LOW (ref 36.0–46.0)
MCH: 28.5 pg (ref 26.0–34.0)
MCHC: 32.1 g/dL (ref 30.0–36.0)
MCV: 88.8 fL (ref 78.0–100.0)
Platelets: 302 10*3/uL (ref 150–400)
RDW: 13.2 % (ref 11.5–15.5)
WBC: 8.8 10*3/uL (ref 4.0–10.5)

## 2012-06-13 LAB — BASIC METABOLIC PANEL
BUN: 12 mg/dL (ref 6–23)
Calcium: 8.9 mg/dL (ref 8.4–10.5)
Chloride: 104 mEq/L (ref 96–112)
Creatinine, Ser: 0.66 mg/dL (ref 0.50–1.10)
GFR calc Af Amer: >90 mL/min (ref >90)

## 2012-06-13 MED ORDER — WARFARIN SODIUM 7.5 MG PO TABS
7.5000 mg | ORAL_TABLET | Freq: Once | ORAL | Status: AC
  Administered 2012-06-13: 7.5 mg via ORAL
  Filled 2012-06-13: qty 1

## 2012-06-13 MED ORDER — OXYCODONE-ACETAMINOPHEN 5-325 MG PO TABS
1.0000 | ORAL_TABLET | Freq: Four times a day (QID) | ORAL | Status: DC | PRN
  Administered 2012-06-13 – 2012-06-14 (×4): 2 via ORAL
  Filled 2012-06-13 (×4): qty 2

## 2012-06-13 NOTE — Evaluation (Signed)
Occupational Therapy Evaluation Patient Details Name: [name] MRN: [id number] DOB: 1956-05-02 Today's Date: 06/13/2012 Time: [phone] (first attempt 1102-1108) OT Time Calculation (min): 34 min  OT Assessment / Plan / Recommendation Clinical Impression  56 yo female s/p Lt tka that does not required skilled OT acutely. Pt has assistance of husband at d/c. Pt progressing well and seen directly after pain medication given by RN [name]    OT Assessment  Patient does not need any further OT services    Follow Up Recommendations  No OT follow up    Barriers to Discharge      Equipment Recommendations  None recommended by OT    Recommendations for Other Services    Frequency       Precautions / Restrictions Precautions Precautions: Knee Required Braces or Orthoses: Knee Immobilizer - Left Knee Immobilizer - Left: On except when in CPM Restrictions LLE Weight Bearing: Weight bearing as tolerated   Pertinent Vitals/Pain Reports 10 out 10 pain during session Pt reports 5 out 10 pain sitting Pt reports goal is to have zero pain at all during this hospital stay    ADL  Eating/Feeding: Independent Where Assessed - Eating/Feeding: Chair Grooming: Wash/dry face;Wash/dry hands;Min guard Where Assessed - Grooming: Unsupported standing Lower Body Dressing:  (pt states "i have that I am good") Toilet Transfer: Radiographer, therapeutic Method: Sit to Barista: Raised toilet seat with arms (or 3-in-1 over toilet) Equipment Used: Rolling walker;Gait belt;Knee Immobilizer Transfers/Ambulation Related to ADLs: Pt ambulating at supervision level. Pt with good sequence with RW. ADL Comments: Pt reports no deficits with bed mobility (PTA [name] confirms), no deficits with LB dressing, and will have assistance of husband at d/c. Pt with all education completed. PT reports needing pain medication because her goal is to have zero pain at this time. Pt  educated that some pain is normal and expected. Pt states "no you don't understand when I stand I am a 10" OT verbalizes recognizing that pain is present but acutely directly after surgery that some pain below a 5 pain level is normal recovery level pain. And patient was encourage to ice for pain management and edema management. In addition the patient was encouraged to speak with MD regarding amount of pain medication. Pt educated that OT is not allowed to pass medications or prescribe we are only able to advocate for patients request. pt without signs of pain during evaluation but verbalized frequently    OT Diagnosis:    OT Problem List:   OT Treatment Interventions:     OT Goals    Visit Information  Last OT Received On: 06/13/12 Assistance Needed: +1 Reason Eval/Treat Not Completed: Pain limiting ability to participate    Subjective Data  Subjective: "[name] are going to get me to hurting"- pt response to therapy arrival Patient Stated Goal: to have zero pain at this time. Pt clearly stated several times "i have a high tolerance to the medication"   Prior Functioning     Home Living Lives With: Spouse Available Help at Discharge: Family;Available 24 hours/day Type of Home: House Home Access: Stairs to enter [employer] of Steps: 9 (15 stairs from the baseline) Entrance Stairs-Rails: Right;Left;Can reach both Home Layout: One level Bathroom Shower/Tub: Walk-in Contractor: Standard Bathroom Accessibility: Yes How Accessible: Accessible via walker Home Adaptive Equipment: Walker - rolling;Bedside commode/3-in-1;Grab bars in shower;Built-in shower seat;Hand-held shower hose;Straight cane Additional Comments: h/o R partial TKA Right leg (>10 years ago) Prior  Function Level of Independence: Needs assistance Needs Assistance: Light Housekeeping Light Housekeeping: Other (comment) (has someone come in once a week) Able to Take Stairs?: Yes Driving:  Yes Vocation: On disability Communication Communication: No difficulties Dominant Hand: Right         Vision/Perception     Cognition  Overall Cognitive Status: Appears within functional limits for tasks assessed/performed Arousal/Alertness: Awake/alert Orientation Level: Appears intact for tasks assessed Behavior During Session: [facility] for tasks performed Cognition - Other Comments: patient perseverates on pain meds and not wanting to have any pain at all    Extremity/Trunk Assessment       Mobility Bed Mobility Bed Mobility: Not assessed Transfers Sit to Stand: 5: Supervision;With upper extremity assist;From chair/3-in-1 Stand to Sit: 5: Supervision;With upper extremity assist;To chair/3-in-1                    End of Session OT - End of Session Activity Tolerance: Patient tolerated treatment well Patient left: in chair;with call bell/phone within reach;with family/visitor present Nurse Communication: Mobility status;Precautions  GO     [name] 06/13/2012, 2:23 PM Pager: [phone]

## 2012-06-13 NOTE — Progress Notes (Signed)
Subjective: Pain controlled.  C/o some anxiety. Patient wondering why we aren't using stronger pain medication since she has been taking norco 5/325 for longterm pain management.     Objective: Vital signs in last 24 hours: Temp:  [97.3 F (36.3 C)-97.9 F (36.6 C)] 97.6 F (36.4 C) (12/12 0616) Pulse Rate:  [67-98] 98  (12/12 0616) Resp:  [9-18] 18  (12/12 0756) BP: (92-132)/(43-91) 95/43 mmHg (12/12 0616) SpO2:  [99 %-100 %] 99 % (12/12 0616)  Intake/Output from previous day: 12/11 0701 - 12/12 0700 In: 2150 [I.V.:2000] Out: 2725 [Urine:2250; Drains:400; Blood:75] Intake/Output this shift: Total I/O In: 360 [P.O.:360] Out: -    Basename 06/13/12 0710  HGB 10.9*    Basename 06/13/12 0710  WBC 8.8  RBC 3.83*  HCT 34.0*  PLT 302    Basename 06/13/12 0710  NA 138  K 3.9  CL 104  CO2 26  BUN 12  CREATININE 0.66  GLUCOSE 103*  CALCIUM 8.9    Basename 06/13/12 0710  LABPT --  INR 0.99    Exam:  Patient appears very comfortable.  Alert. Some bleeding through dressing.  Calf nt, nvi.    Assessment/Plan: Advised patient that since pain is controlled with norco 10/325 that we do not need to increase this.  We don't increase narcotic for anxiety.  Has valium ordered.  D/c dilaudid.  Anticipate discharge home Friday or Saturday.   [name] M 06/13/2012, 9:54 AM

## 2012-06-13 NOTE — Progress Notes (Addendum)
ANTICOAGULATION CONSULT NOTE - Follow Up Consult  Pharmacy Consult for Coumadin Indication: VTE prophylaxis  Allergies  Allergen Reactions  . Mupirocin     Bumps on tongue and thickness to tongue    Vital Signs: Temp: 97.6 F (36.4 C) (12/12 0616) BP: 95/43 mmHg (12/12 0616) Pulse Rate: 98  (12/12 0616)  Medical History: Past Medical History  Diagnosis Date  . Hyperlipidemia     takes Simvastatin nightly  . Seasonal allergies     in spring  . History of migraine     last one was on 06/02/12;takes Topamax bid and Imitrex prn  . Arthritis   . Joint pain   . Joint swelling   . Insomnia     takes Trazodone nightly  . Headache     Assessment: 56yo female started on coumadin and Lovenox 30mg  q12 until INR > 1.8 for VTE prophylaxis s/p L TKA.  INR 0.9> 0.99 after 1 dose Coumadin 5mg .  CBC stable.  Will increase Coumadin dose.  Goal of Therapy:  INR 2-3 Monitor platelets by anticoagulation protocol: Yes   Plan:  1) Coumadin 7.5mg  PO x 1  2) Follow-up INR daily    Leota Sauers Pharm.D. CPP, BCPS Clinical Pharmacist [phone] 06/13/2012 10:03 AM

## 2012-06-13 NOTE — Progress Notes (Signed)
Physical Therapy Treatment Patient Details Name: [name] MRN: [id number] DOB: 1956-01-13 Today's Date: 06/13/2012 Time: 4098-1191 PT Time Calculation (min): 39 min  PT Assessment / Plan / Recommendation Comments on Treatment Session  Patient progressing well with mobility. She appaers anxious throughout and concerned with increasing pain. Will increase ambulation tolerance this afternoon and possibly attempt stair training    Follow Up Recommendations  Home health PT;Supervision - Intermittent     Does the patient have the potential to tolerate intense rehabilitation     Barriers to Discharge        Equipment Recommendations  None recommended by PT    Recommendations for Other Services    Frequency 7X/week   Plan Discharge plan remains appropriate;Frequency remains appropriate    Precautions / Restrictions Precautions Required Braces or Orthoses: Knee Immobilizer - Left Knee Immobilizer - Left: On except when in CPM Restrictions LLE Weight Bearing: Weight bearing as tolerated   Pertinent Vitals/Pain     Mobility  Bed Mobility Supine to Sit: 4: Min assist Sitting - Scoot to Edge of Bed: 5: Supervision Details for Bed Mobility Assistance: A with L LE, no rails needed this session Transfers Sit to Stand: 4: Min guard;With upper extremity assist;From chair/3-in-1;From bed Stand to Sit: To chair/3-in-1;4: Min guard;With upper extremity assist Details for Transfer Assistance: Cues for safe hand placement and patient initially with heavy forward lean with standing but able to correct with practice Ambulation/Gait Ambulation/Gait Assistance: 4: Min guard Ambulation Distance (Feet): 120 Feet Assistive device: Rolling walker Ambulation/Gait Assistance Details: Cues for RW management and posture.  Gait Pattern: Step-to pattern;Trunk flexed    Exercises Total Joint Exercises Quad Sets: AROM;Left;15 reps Short Arc QuadBarbaraann Myers;Left;10 reps Heel Slides: AAROM;Left;10  reps Hip ABduction/ADduction: AAROM;Left;10 reps Straight Leg Raises: AAROM;Left;10 reps   PT Diagnosis:    PT Problem List:   PT Treatment Interventions:     PT Goals Acute Rehab PT Goals PT Goal: Supine/Side to Sit - Progress: Progressing toward goal PT Goal: Sit to Stand - Progress: Progressing toward goal PT Goal: Stand to Sit - Progress: Progressing toward goal PT Transfer Goal: Bed to Chair/Chair to Bed - Progress: Progressing toward goal PT Goal: Ambulate - Progress: Progressing toward goal  Visit Information  Last PT Received On: 06/13/12 Assistance Needed: +1    Subjective Data      Cognition  Overall Cognitive Status: Appears within functional limits for tasks assessed/performed Arousal/Alertness: Awake/alert Orientation Level: Appears intact for tasks assessed Behavior During Session: [facility] for tasks performed    Balance     End of Session PT - End of Session Equipment Utilized During Treatment: Gait belt;Left knee immobilizer Activity Tolerance: Patient tolerated treatment well Patient left: in chair;with call bell/phone within reach Nurse Communication: Mobility status   GP     Fredrich Birks 06/13/2012, 9:19 AM  06/13/2012 Fredrich Birks PTA [phone] pager [phone] office

## 2012-06-13 NOTE — Progress Notes (Signed)
Physical Therapy Treatment Patient Details Name: [name] MRN: [id number] DOB: [birth year] Today's Date: 06/13/2012 Time: 7829-5621 PT Time Calculation (min): 34 min  PT Assessment / Plan / Recommendation Comments on Treatment Session  Patient progressing with ambulation and stairs. Continues to worry about pain meds and management throughout session    Follow Up Recommendations  Home health PT;Supervision - Intermittent     Does the patient have the potential to tolerate intense rehabilitation     Barriers to Discharge        Equipment Recommendations  None recommended by PT    Recommendations for Other Services    Frequency 7X/week   Plan Discharge plan remains appropriate;Frequency remains appropriate    Precautions / Restrictions Precautions Precautions: Knee Required Braces or Orthoses: Knee Immobilizer - Left Knee Immobilizer - Left: On except when in CPM Restrictions LLE Weight Bearing: Weight bearing as tolerated   Pertinent Vitals/Pain     Mobility  Bed Mobility Bed Mobility: Not assessed Transfers Sit to Stand: 5: Supervision;With upper extremity assist;From chair/3-in-1 Stand to Sit: 5: Supervision;With upper extremity assist;To chair/3-in-1 Ambulation/Gait Ambulation/Gait Assistance: 5: Supervision Ambulation Distance (Feet): 250 Feet Assistive device: Rolling walker Gait Pattern: Step-through pattern;Decreased stride length;Decreased step length - right;Decreased step length - left Stairs: Yes Stairs Assistance: 4: Min guard Stair Management Technique: Step to pattern;Backwards;Forwards;Two rails Number of Stairs: 4     Exercises     PT Diagnosis:    PT Problem List:   PT Treatment Interventions:     PT Goals Acute Rehab PT Goals PT Goal: Sit to Stand - Progress: Progressing toward goal PT Goal: Stand to Sit - Progress: Progressing toward goal PT Transfer Goal: Bed to Chair/Chair to Bed - Progress: Progressing toward goal PT Goal:  Ambulate - Progress: Progressing toward goal PT Goal: Up/Down Stairs - Progress: Progressing toward goal  Visit Information  Last PT Received On: 06/13/12 Assistance Needed: +1    Subjective Data      Cognition  Overall Cognitive Status: Appears within functional limits for tasks assessed/performed Arousal/Alertness: Awake/alert Orientation Level: Appears intact for tasks assessed Behavior During Session: [facility] for tasks performed Cognition - Other Comments: patient perseverates on pain meds and not wanting to have any pain at all    Balance     End of Session PT - End of Session Equipment Utilized During Treatment: Gait belt;Left knee immobilizer Activity Tolerance: Patient tolerated treatment well Patient left: in chair;with call bell/phone within reach Nurse Communication: Mobility status   GP     Fredrich Birks 06/13/2012, 2:07 PM 06/13/2012 Fredrich Birks PTA [phone] pager [phone] office

## 2012-06-13 NOTE — Op Note (Signed)
[name], [name]              ACCOUNT NO.:  [id number]  MEDICAL RECORD NO.:  [id number]  LOCATION:  [id number]                        FACILITY:  [facility]  PHYSICIAN:  Loreta Ave, M.D. DATE OF BIRTH:  [birth year]  DATE OF PROCEDURE:  06/12/2012 DATE OF DISCHARGE:                              OPERATIVE REPORT   PREOPERATIVE DIAGNOSES:  Tricompartmental degenerative arthritis, left knee.  Avascular necrosis, medial and lateral condyles.  Mild subchondral collapse.  POSTOPERATIVE DIAGNOSES:  Tricompartmental degenerative arthritis, left knee.  Avascular necrosis, medial and lateral condyles.  Mild subchondral collapse with relatively extensive changes throughout the lateral femoral condyle, distal and posterior aspect.  At least grade 3 changes all compartments.  PROCEDURE:  Left knee modified minimally invasive total knee replacement with Stryker triathlon prosthesis.  Soft tissue balancing.  Curettage of lateral femoral condyle of all dead abnormal bone.  Cancellous bone grafting from other cuts.  Cemented pegged posterior stabilized #4 femoral component.  Cemented #4 tibial component, 9 mm polyethylene insert.  Cemented resurfacing 32-mm patellar component.  SURGEON:  Loreta Ave, M.D.  ASSISTANT:  Genene Churn. Barry Dienes, [place], present throughout the entire case and necessary for timely completion of procedure.  ANESTHESIA:  General.  ESTIMATED BLOOD LOSS:  Minimal.  SPECIMENS:  None.  CULTURES:  None.  COMPLICATION:  None.  DRESSINGS:  Soft compressive knee immobilizer.  DRAINS:  Hemovac x1.  TOURNIQUET TIME:  45 minutes.  PROCEDURE IN DETAIL:  The patient was brought to the operating room, placed on the operating table in supine position.  After adequate anesthesia had been obtained, tourniquet was applied.  Prepped and draped in the usual sterile fashion.  Exsanguinated with elevation of Esmarch.  Tourniquet inflated to 350 mmHg.  Relatively good alignment with  full extension, flexion better than 100 degrees.  Stable ligaments. Anterior incision above the patella down to tibial tubercle.  Medial arthrotomy, vastus splitting.  Knee was exposed.  At least grade 3 change in all compartments.  Evidence of some early collapse of the lateral femoral condyle.  Remnants of menisci, cruciate ligaments, loose bodies were removed.  Distal femur exposed.  Intramedullary guide placed.  An 8-mm resection 5 degrees of valgus.  The degree of changes especially lateral side were relatively marked.  Using epicondylar axis, the femur was sized, cut, and fitted for a posterior stabilized PEG #4 component.  I curetted out all the dead bone in the lateral femoral condyle.  Most of the dead bone removed from the medial femoral condyle with the cuts.  This was secured back to good tissue.  Prior to placing the final component, it was packed with cancellous bone from the other cuttings.  Proximal tibia exposed.  Extramedullary guide.  A 3-degree posterior slope cut.  Knee was nicely balanced in flexion, extension. Debris cleared throughout.  Patella exposed.  Posterior 10 mm removed. Drilled, sized, and fitted for a 32 mm component.  Trials put in place. A #4 above and below, 9-mm insert and 32 patella.  With this construct, excellent alignment, nice biomechanical axis.  Nicely balanced in flexion and extension with good patellofemoral tracking.  Tibia was marked for rotation and reamed.  All trials removed.  Copious irrigation with a pulse irrigating device.  Cancellous bone packed in the defect of lateral femoral condyle, which was very contained.  Cement was prepared. Placed on all components.  Femoral component well seated.  The tibial component was cemented, 9 mm insert attached.  Patellar component cemented and held with a clamp.  Once the cement hardened, the knee was reexamined.  Again, nicely balanced in flexion, extension.  Good by mechanical axis, good  patellofemoral tracking, and good stability. Hemovac was placed, brought out through a separate stab wound. Arthrotomy closed with #1 Vicryl.  Skin and subcu tissue with Vicryl and staples.  Sterile compressive dressing applied.  Tourniquet deflated removed.  Knee immobilizer applied.  Anesthesia reversed.  Brought to recovery room.  Tolerated surgery well.  No complications.     Loreta Ave, M.D.     DFM/MEDQ  D:  06/12/2012  T:  06/13/2012  Job:  [id number]

## 2012-06-13 NOTE — Progress Notes (Signed)
OT Cancellation Note  Patient Details Name: [name] MRN: [id number] DOB: 09-23-1955   Cancelled Treatment:    Reason Eval/Treat Not Completed: Pain limiting ability to participate. Pt in tears reporting 10 out 10 pain and refusing OT evaluation at this time. Ot applied ICE to knee and plans to reattempt in the PM hours when pain medication available again. OT evaluation held at this time  [name] Pager: [phone]  06/13/2012, 11:10 AM

## 2012-06-14 ENCOUNTER — Encounter (HOSPITAL_COMMUNITY): Payer: Self-pay | Admitting: Orthopedic Surgery

## 2012-06-14 LAB — BASIC METABOLIC PANEL
CO2: 24 mEq/L (ref 19–32)
Calcium: 8.9 mg/dL (ref 8.4–10.5)
Creatinine, Ser: 0.65 mg/dL (ref 0.50–1.10)
GFR calc non Af Amer: >90 mL/min (ref >90)
Glucose, Bld: 96 mg/dL (ref 70–99)

## 2012-06-14 LAB — CBC
MCH: 28.5 pg (ref 26.0–34.0)
MCHC: 32.9 g/dL (ref 30.0–36.0)
MCV: 86.6 fL (ref 78.0–100.0)
Platelets: 284 10*3/uL (ref 150–400)
RBC: 3.65 MIL/uL — ABNORMAL LOW (ref 3.87–5.11)

## 2012-06-14 MED ORDER — METHOCARBAMOL 500 MG PO TABS: 500.0000 mg | ORAL_TABLET | Freq: Four times a day (QID) | ORAL | Status: DC | PRN

## 2012-06-14 MED ORDER — PROMETHAZINE HCL 12.5 MG PO TABS: 12.5000 mg | ORAL_TABLET | Freq: Four times a day (QID) | ORAL | Status: DC | PRN

## 2012-06-14 MED ORDER — OXYCODONE-ACETAMINOPHEN 5-325 MG PO TABS: 1.0000 | ORAL_TABLET | ORAL | Status: DC | PRN

## 2012-06-14 MED ORDER — ENOXAPARIN SODIUM 30 MG/0.3ML ~~LOC~~ SOLN: 30.0000 mg | Freq: Two times a day (BID) | SUBCUTANEOUS | Status: DC

## 2012-06-14 MED ORDER — WARFARIN SODIUM 7.5 MG PO TABS
7.5000 mg | ORAL_TABLET | Freq: Once | ORAL | Status: DC
  Filled 2012-06-14: qty 1

## 2012-06-14 MED ORDER — WARFARIN SODIUM 5 MG PO TABS: 5.0000 mg | ORAL_TABLET | Freq: Every day | ORAL | Status: DC

## 2012-06-14 NOTE — Progress Notes (Signed)
SubjectivE Doing well.  Pain controlled.  Wants to go home.   Objective: Vital signs in last 24 hours: Temp:  [98.9 F (37.2 C)-99 F (37.2 C)] 98.9 F (37.2 C) (12/13 0629) Pulse Rate:  [101-105] 101  (12/13 0629) Resp:  [18] 18  (12/13 0800) BP: (124-141)/(57-68) 124/57 mmHg (12/13 0629) SpO2:  [95 %-100 %] 95 % (12/13 0629) Weight:  [72.122 kg (159 lb)] 72.122 kg (159 lb) (12/13 1100)  Intake/Output from previous day: 12/12 0701 - 12/13 0700 In: 417 [P.O.:360; IV Piggyback:57] Out: 185 [Drains:185] Intake/Output this shift:     Basename 06/14/12 0425 06/13/12 0710  HGB 10.4* 10.9*    Basename 06/14/12 0425 06/13/12 0710  WBC 7.4 8.8  RBC 3.65* 3.83*  HCT 31.6* 34.0*  PLT 284 302    Basename 06/14/12 0425 06/13/12 0710  NA 140 138  K 3.4* 3.9  CL 105 104  CO2 24 26  BUN 6 12  CREATININE 0.65 0.66  GLUCOSE 96 103*  CALCIUM 8.9 8.9    Basename 06/14/12 0425 06/13/12 0710  LABPT -- --  INR 1.33 0.99    Exam:  Wound looks good. Staples intact.  No drainage or signs of infection.  Drain removed.  Calf nt, nvi.    Assessment/Plan: D/c home today.  Script for percocet given.  Advised she must stop norco and valium when she gets home and take percocet as prescribed.  F/u as scheduled.     Monzerrath Mcburney M 06/14/2012, 1:40 PM

## 2012-06-14 NOTE — Progress Notes (Signed)
CARE MANAGEMENT NOTE 06/14/2012  Patient:  KHLOE, HUNKELE   Account Number:  [id number]  Date Initiated:  06/14/2012  Documentation initiated by:  Vance Peper  Subjective/Objective Assessment:   56 yr old female s/p left total knee arthroplasty.     Action/Plan:   CM spoke with patient concerning home health and DME needs at discharge. Choice offered. Patient has rolling walker, 3in1, CPM has been delivered to her home.   Anticipated DC Date:  06/14/2012   Anticipated DC Plan:  HOME W HOME HEALTH SERVICES      DC Planning Services  CM consult      [facility] Choice  HOME HEALTH   Choice offered to / List presented to:  C-1 Patient        HH arranged  HH-1 RN  HH-2 PT      [facility] agency  [facility]   Status of service:  Completed, signed off Medicare Important Message given?   (If response is "NO", the following Medicare IM given date fields will be blank) Date Medicare IM given:   Date Additional Medicare IM given:    Discharge Disposition:  HOME W HOME HEALTH SERVICES  Per UR Regulation:    If discussed at Long Length of Stay Meetings, dates discussed:    Comments:

## 2012-06-14 NOTE — Progress Notes (Signed)
Physical Therapy Treatment Patient Details Name: [name] MRN: [id number] DOB: August 12, 1955 Today's Date: 06/14/2012 Time: 4098-1191 PT Time Calculation (min): 20 min  PT Assessment / Plan / Recommendation Comments on Treatment Session  Patient continues to worry about pain control throughout session. Will review HEP and ambulation next session prior to DC home.     Follow Up Recommendations  Home health PT;Supervision - Intermittent     Does the patient have the potential to tolerate intense rehabilitation     Barriers to Discharge        Equipment Recommendations  None recommended by PT    Recommendations for Other Services    Frequency 7X/week   Plan Discharge plan remains appropriate;Frequency remains appropriate    Precautions / Restrictions Precautions Precautions: Knee Required Braces or Orthoses: Knee Immobilizer - Left Knee Immobilizer - Left: On except when in CPM Restrictions Weight Bearing Restrictions: Yes LLE Weight Bearing: Weight bearing as tolerated   Pertinent Vitals/Pain     Mobility  Bed Mobility Supine to Sit: 6: Modified independent (Device/Increase time) Sitting - Scoot to Edge of Bed: 6: Modified independent (Device/Increase time) Transfers Sit to Stand: 6: Modified independent (Device/Increase time) Stand to Sit: 6: Modified independent (Device/Increase time) Ambulation/Gait Ambulation/Gait Assistance: 5: Supervision Ambulation Distance (Feet): 200 Feet Assistive device: Rolling walker Ambulation/Gait Assistance Details: Did try ambulation without KI and patient did very well with no buckling noted.  Gait Pattern: Step-through pattern;Decreased stride length Stairs Assistance: 4: Min guard Stair Management Technique: Two rails;Backwards;Step to pattern Number of Stairs: 4     Exercises Total Joint Exercises Quad Sets: AROM;Right;15 reps Short Arc QuadBarbaraann Boys;Right;10 reps Heel Slides: Right;10 reps;AROM Hip ABduction/ADduction:  AROM;Right;10 reps Straight Leg Raises: AROM;Right;10 reps   PT Diagnosis:    PT Problem List:   PT Treatment Interventions:     PT Goals Acute Rehab PT Goals PT Goal: Supine/Side to Sit - Progress: Met PT Goal: Sit to Supine/Side - Progress: Met PT Goal: Sit to Stand - Progress: Met PT Goal: Stand to Sit - Progress: Met PT Transfer Goal: Bed to Chair/Chair to Bed - Progress: Met PT Goal: Ambulate - Progress: Met PT Goal: Up/Down Stairs - Progress: Met  Visit Information  Last PT Received On: 06/14/12 Assistance Needed: +1    Subjective Data      Cognition  Overall Cognitive Status: Appears within functional limits for tasks assessed/performed Arousal/Alertness: Awake/alert Orientation Level: Appears intact for tasks assessed Behavior During Session: [facility] for tasks performed    Balance     End of Session PT - End of Session Equipment Utilized During Treatment: Gait belt;Left knee immobilizer Activity Tolerance: Patient tolerated treatment well;Patient limited by pain Patient left: in bed;with call bell/phone within reach   GP     Fredrich Birks 06/14/2012, 11:45 AM 06/14/2012 Fredrich Birks PTA [phone] pager [phone] office

## 2012-06-14 NOTE — Progress Notes (Signed)
ANTICOAGULATION CONSULT NOTE - Follow Up Consult  Pharmacy Consult for Coumadin Indication: VTE prophylaxis  Allergies  Allergen Reactions  . Mupirocin     Bumps on tongue and thickness to tongue    Vital Signs: Temp: 98.9 F (37.2 C) (12/13 0629) Temp src: Oral (12/13 0629) BP: 124/57 mmHg (12/13 0629) Pulse Rate: 101  (12/13 0629)  Medical History: Past Medical History  Diagnosis Date  . Hyperlipidemia     takes Simvastatin nightly  . Seasonal allergies     in spring  . History of migraine     last one was on 06/02/12;takes Topamax bid and Imitrex prn  . Arthritis   . Joint pain   . Joint swelling   . Insomnia     takes Trazodone nightly  . Headache     Assessment: 56yo female started on coumadin and Lovenox 30mg  q12 until INR > 1.8 for VTE prophylaxis s/p L TKA.  INR 1.33.  CBC stable.    Goal of Therapy:  INR 2-3 Monitor platelets by anticoagulation protocol: Yes   Plan:  1) Coumadin 7.5mg  PO x 1  2) Follow-up INR daily

## 2012-06-14 NOTE — Progress Notes (Signed)
Physical Therapy Treatment Patient Details Name: [name] MRN: [id number] DOB: July 05, 1955 Today's Date: 06/14/2012 Time: 0830-0902 PT Time Calculation (min): 32 min  PT Assessment / Plan / Recommendation Comments on Treatment Session  Patient continues to worry about pain control throughout session. Will review HEP and ambulation next session prior to DC home.     Follow Up Recommendations  Home health PT;Supervision - Intermittent     Does the patient have the potential to tolerate intense rehabilitation     Barriers to Discharge        Equipment Recommendations  None recommended by PT    Recommendations for Other Services    Frequency 7X/week   Plan Discharge plan remains appropriate;Frequency remains appropriate    Precautions / Restrictions Precautions Precautions: Knee Required Braces or Orthoses: Knee Immobilizer - Left Knee Immobilizer - Left: On except when in CPM   Pertinent Vitals/Pain     Mobility  Bed Mobility Supine to Sit: 6: Modified independent (Device/Increase time) Sitting - Scoot to Edge of Bed: 6: Modified independent (Device/Increase time) Transfers Sit to Stand: 6: Modified independent (Device/Increase time) Stand to Sit: 6: Modified independent (Device/Increase time) Ambulation/Gait Ambulation/Gait Assistance: 5: Supervision Ambulation Distance (Feet): 250 Feet Assistive device: Rolling walker Gait Pattern: Step-through pattern Stairs Assistance: 4: Min guard Stair Management Technique: Two rails;Backwards;Step to pattern Number of Stairs: 4     Exercises     PT Diagnosis:    PT Problem List:   PT Treatment Interventions:     PT Goals Acute Rehab PT Goals PT Goal: Supine/Side to Sit - Progress: Met PT Goal: Sit to Stand - Progress: Met PT Goal: Stand to Sit - Progress: Met PT Transfer Goal: Bed to Chair/Chair to Bed - Progress: Met PT Goal: Ambulate - Progress: Met PT Goal: Up/Down Stairs - Progress: Met  Visit  Information  Last PT Received On: 06/14/12 Assistance Needed: +1    Subjective Data      Cognition  Overall Cognitive Status: Appears within functional limits for tasks assessed/performed Arousal/Alertness: Awake/alert Orientation Level: Appears intact for tasks assessed Behavior During Session: [facility] for tasks performed    Balance     End of Session PT - End of Session Equipment Utilized During Treatment: Gait belt;Left knee immobilizer Activity Tolerance: Patient tolerated treatment well;Patient limited by pain Patient left: in chair;with call bell/phone within reach   GP     Fredrich Birks 06/14/2012, 10:32 AM  06/14/2012 Fredrich Birks PTA [phone] pager [phone] office

## 2012-07-01 NOTE — Discharge Summary (Signed)
  ABBREVIATED DISCHARGE SUMMARY      DATE OF HOSPITALIZATION:  12 Jun 2012  REASON FOR HOSPITALIZATION:  56 yo wf with hx of left avn/djd and pain.  Failed conservative treatment.      SIGNIFICANT FINDINGS: avn/ DJD  OPERATION:  Left total knee replacement  FINAL DIAGNOSIS:  Left total knee replacement  SECONDARY DIAGNOSIS: none  CONSULTANTS:  none  DISCHARGE CONDITION:  STABLE  DISCHARGED TO:  HOME

## 2012-07-02 ENCOUNTER — Ambulatory Visit (HOSPITAL_COMMUNITY)
Admission: RE | Admit: 2012-07-02 | Discharge: 2012-07-02 | Disposition: A | Discharge status: Home / Self Care | Payer: Medicare Other | Source: Ambulatory Visit | Attending: Orthopedic Surgery | Admitting: Orthopedic Surgery

## 2012-07-02 DIAGNOSIS — M25562 Pain in left knee: Secondary | ICD-10-CM | POA: Insufficient documentation

## 2012-07-02 DIAGNOSIS — Z96659 Presence of unspecified artificial knee joint: Secondary | ICD-10-CM | POA: Insufficient documentation

## 2012-07-02 DIAGNOSIS — M6281 Muscle weakness (generalized): Secondary | ICD-10-CM | POA: Insufficient documentation

## 2012-07-02 DIAGNOSIS — M25569 Pain in unspecified knee: Secondary | ICD-10-CM | POA: Insufficient documentation

## 2012-07-02 DIAGNOSIS — M25662 Stiffness of left knee, not elsewhere classified: Secondary | ICD-10-CM | POA: Insufficient documentation

## 2012-07-02 DIAGNOSIS — R262 Difficulty in walking, not elsewhere classified: Secondary | ICD-10-CM | POA: Insufficient documentation

## 2012-07-02 DIAGNOSIS — IMO0001 Reserved for inherently not codable concepts without codable children: Secondary | ICD-10-CM | POA: Insufficient documentation

## 2012-07-02 DIAGNOSIS — M25669 Stiffness of unspecified knee, not elsewhere classified: Secondary | ICD-10-CM | POA: Insufficient documentation

## 2012-07-02 NOTE — Evaluation (Addendum)
Physical Therapy Evaluation  Patient Details  Name: [name] MRN: [id number] Date of Birth: Feb 09, 1956  Today's Date: 07/02/2012 Time: 1320-1415 PT Time Calculation (min): 55 min Charges: 1 eval, 15' TE Visit#: 1  of 12   Re-eval: 08/01/12 Assessment Diagnosis: L TKR Surgical Date: 06/12/12 Next MD Visit: Dr. Eulah Pont - 07/05/12  Past Medical History:  Past Medical History  Diagnosis Date  . Hyperlipidemia     takes Simvastatin nightly  . Seasonal allergies     in spring  . History of migraine     last one was on 06/02/12;takes Topamax bid and Imitrex prn  . Arthritis   . Joint pain   . Joint swelling   . Insomnia     takes Trazodone nightly  . Headache    Past Surgical History:  Past Surgical History  Procedure Date  . Cyst removed from right knee  1972  . Left knee surgery 1973  . Abdominal hysterectomy 1978  . Right knee arthroscopy 90's     x 3  . Right knee partial replacement 1991  . Bladder tacked 1999  . Breast surgery 2009    cyst removed from left breast  . Right wrist sugery 2008  . Scar tissue removed 2008  . Appendectomy 2008  . Right wrist surgery   . Left knee surgery 2012  . Left knee surgery 2012  . Right carpal tunnel   . Colonoscopy   . Artroplasty   . Total knee arthroplasty 06/12/2012    LEFT KNEE  . Total knee arthroplasty 06/12/2012    Procedure: TOTAL KNEE ARTHROPLASTY;  Surgeon: Loreta Ave, MD;  [facility];  Service: Orthopedics;  Laterality: Left;    Subjective Symptoms/Limitations Symptoms: PMH: avascular necrosis.  Pertinent History: Pt is referred to PT s/p L TKR on 06/12/12.  She went to her physican yesterday because her knee was swollen and red.  She reports that her HHPT was ambulating with a SPC on the L side.  She reports pain to her L ankle from the surgery.  How long can you stand comfortably?: less than 5 minutes How long can you walk comfortably?: less than 5 minutes Patient Stated Goals: "I want to  be able to walk better and not have so much pain in my ankle." Pain Assessment Currently in Pain?: Yes Pain Score:   4 Pain Location: Knee Pain Orientation: Left Pain Type: Acute pain Multiple Pain Sites: Yes  Precautions/Restrictions  Precautions Precautions: Knee Restrictions LLE Weight Bearing: Weight bearing as tolerated  Cognition/Observation Observation/Other Assessments Observations: significant redness to her lateral knee.  Redness traced.  Assessment LLE AROM (degrees) Left Knee Extension: 0  Left Knee Flexion: 100  LLE PROM (degrees) Left Knee Extension: 0 Left Knee Flexion: 106 LLE Strength Left Hip Flexion: 4/5 Left Hip Extension: 4/5 Left Hip ABduction: 3+/5 Left Hip ADduction: 3+/5 Left Knee Flexion: 3+/5 Left Knee Extension: 3+/5 Left Ankle Dorsiflexion: 5/5 Left Ankle Plantar Flexion: 5/5 Left Ankle Inversion: 4/5 (pain) Left Ankle Eversion: 4/5 (pain ) Palpation Palpation: significant pain and tenderness to joint line.    Mobility/Balance  Ambulation/Gait Ambulation/Gait Assistance: 6: Modified independent (Device/Increase time) Assistive device: Straight cane Gait Pattern: Step-to pattern;Decreased stance time - left;Decreased hip/knee flexion - left;Trunk flexed Static Standing Balance Single Leg Stance - Right Leg: 10  Single Leg Stance - Left Leg: 2  Tandem Stance - Right Leg: 8  Tandem Stance - Left Leg: 2    Exercise/Treatments Stretches Quad Stretch: 3  reps;30 seconds Supine Bridges: 10 reps Straight Leg Raises: 10 reps Sidelying Hip ABduction: 10 reps Hip ADduction: 10 reps Prone  Hamstring Curl: 10 reps Hip Extension: 10 reps  Modalities Modalities: Cryotherapy Cryotherapy Number Minutes Cryotherapy: 10 Minutes Cryotherapy Location: Knee Type of Cryotherapy: Ice pack  Physical Therapy Assessment and Plan PT Assessment and Plan Clinical Impression Statement: Pt is a 56 year old female referred to PT s/p L TKR on  06/12/12 and presents today with SIGNIFICANT ERYTHEMA AND REDNESS w/o joint effusion.  Her L knee AROM: 0-100 and PROM: 0-106 degrees.  Erythema traced and pt was educated on proper HEP and gait with SPC.  Pt will benefit from skilled therapeutic intervention in order to improve on the following deficits: Abnormal gait;Decreased balance;Decreased mobility;Decreased strength;Decreased range of motion;Pain;Difficulty walking;Impaired perceived functional ability;Increased edema Rehab Potential: Good PT Frequency: Min 3X/week PT Duration: 6 weeks PT Treatment/Interventions: DME instruction;Gait training;Stair training;Functional mobility training;Therapeutic activities;Therapeutic exercise;Balance training;Neuromuscular re-education;Patient/family education;Manual techniques;Modalities PT Plan: Bike for warm up if necessary, TM for gait mechanics. Continue with gentle bed activities (4 way SLR, SAQ), add functional squats, heel and toe raises, standing knee flexion concentrate on proper gait mechanics.    Goals Home Exercise Program Pt will Perform Home Exercise Program: Independently PT Goal: Perform Home Exercise Program - Progress: Goal set today PT Short Term Goals Time to Complete Short Term Goals: 3 weeks PT Short Term Goal 1: Pt will report pain less than 3/10 for 75% of her day for improved QOL.  PT Short Term Goal 2: Pt will improve her L LE strength WNL.  PT Short Term Goal 3: Pt will ambulate independently with mild gait impairments. PT Short Term Goal 4: Pt will improve her L knee AROM: 0-110 degrees PT Short Term Goal 5: Pt will present with decreased redness to her lateral knee.  PT Long Term Goals Time to Complete Long Term Goals: Other (comment) (6 weeks) PT Long Term Goal 1: Pt will improve her LE strength to [facility] in order to ascend and descend stairs w/1 handrail w/reciprical pattern with pain no greater than 4/10.  PT Long Term Goal 2: Pt will improve her LE strength in order to  ambulate independently with mild gait impairments to decrease secondary injuries.  Long Term Goal 3: Pt will report pain to her L knee and ankle less than 3/10 for 75% of her day for improved QOL>  Long Term Goal 4: Pt will improve her L knee AROM to WNL in order to ambulate with apporpriate gait mechanics.  PT Long Term Goal 5: Pt will improve LE static standing balance x30 sec on R and L LE.   Problem List Patient Active Problem List  Diagnosis  . Muscle weakness (generalized)  . Total knee replacement status  . Difficulty in walking  . Knee stiffness  . Knee pain   PT Plan of Care PT Home Exercise Plan: see scanned report PT Patient Instructions: importance of HEP and discussed SPC in R hand to decrease pressure on L side.  Consulted and Agree with Plan of Care: Patient  GP THERAPY MOBILITY GOAL STATUS [id number] GP, CI THERAPY MOBILITY CURRENT STATUS [phone] GP, CK    Shannara Winbush, PT 07/02/2012, 2:52 PM  Physician Documentation Your signature is required to indicate approval of the treatment plan as stated above.  Please sign and either send electronically or make a copy of this report for your files and return this physician signed original.   Please mark one 1.__approve  of plan  2. ___approve of plan with the following conditions.   ______________________________                                                          _____________________ Physician Signature                                                                                                             Date

## 2012-07-05 ENCOUNTER — Ambulatory Visit (HOSPITAL_COMMUNITY)
Admission: RE | Admit: 2012-07-05 | Discharge: 2012-07-05 | Disposition: A | Discharge status: Home / Self Care | Payer: Medicare Other | Source: Ambulatory Visit | Attending: Family Medicine | Admitting: Family Medicine

## 2012-07-05 DIAGNOSIS — M25669 Stiffness of unspecified knee, not elsewhere classified: Secondary | ICD-10-CM | POA: Insufficient documentation

## 2012-07-05 DIAGNOSIS — IMO0001 Reserved for inherently not codable concepts without codable children: Secondary | ICD-10-CM | POA: Insufficient documentation

## 2012-07-05 DIAGNOSIS — R262 Difficulty in walking, not elsewhere classified: Secondary | ICD-10-CM | POA: Insufficient documentation

## 2012-07-05 DIAGNOSIS — M6281 Muscle weakness (generalized): Secondary | ICD-10-CM | POA: Insufficient documentation

## 2012-07-05 DIAGNOSIS — M25569 Pain in unspecified knee: Secondary | ICD-10-CM | POA: Insufficient documentation

## 2012-07-05 DIAGNOSIS — Z96659 Presence of unspecified artificial knee joint: Secondary | ICD-10-CM | POA: Insufficient documentation

## 2012-07-05 NOTE — Progress Notes (Signed)
Physical Therapy Treatment Patient Details  Name: [name] MRN: [id number] Date of Birth: [birth year]  Today's Date: 07/05/2012 Time: 1350-1446 PT Time Calculation (min): 56 min  Visit#: 2  of 12   Re-eval: 08/01/12  Charge: Gait 15', therex 30', ice 10'  Authorization: Medicare  Authorization Time Period:    Authorization Visit#: 2  of 10    Subjective: Symptoms/Limitations Symptoms: Pt stated compliance with HEP, brought in referral form to increase OPPT to 3x a week. Pain Assessment Currently in Pain?: Yes Pain Score:   5 Pain Location: Knee Pain Orientation: Left  Objective:   Exercise/Treatments Stretches Active Hamstring Stretch: 3 reps;30 seconds Quad Stretch: 3 reps;30 seconds Aerobic Stationary Bike: Rocking x 10 min, able to make 5 full revolutions backwards 15' total on bike; seat 13 Standing Heel Raises: 15 reps;Limitations Heel Raises Limitations: toe raises 15 reps Knee Flexion: 15 reps Functional Squat: Limitations Functional Squat Limitations: attempted but pt requested to hold due to R LE pain Gait Training: Gait training for equal stride length, heel to toe and equal stance phase x 15'  Supine Short Arc Quad Sets: Limitations Short Arc Quad Sets Limitations: next session Heel Slides: Limitations Heel Slides Limitations: next session Bridges: Limitations Bridges Limitations: resume next session Straight Leg Raises: Limitations Straight Leg Raises Limitations: resume next session Prone  Hamstring Curl: 15 reps Hip Extension: 10 reps   Modalities Modalities: Cryotherapy Cryotherapy Number Minutes Cryotherapy: 10 Minutes Cryotherapy Location: Knee Type of Cryotherapy: Ice pack  Physical Therapy Assessment and Plan PT Assessment and Plan Clinical Impression Statement: Began POC for L knee ROM, strength and to improve gait mechanics.  Pt with difficulty initially on reciprocal bike due to impaired ROM, able to make 5 full revolution  going reverse.  Good sequencing noted with SPC gait training, pt did require cueing to equalize stride length, stance phases and increase knee flexion.  Pt able to demonstrate appropriate technique with all therex following cues.  Pt did state pain increase R knee with functional squats today. PT Plan: Continue with gait training, begin TM to improve gait mechanics and begin 4 way SLR and SAQ.    Goals    Problem List Patient Active Problem List  Diagnosis  . Muscle weakness (generalized)  . Total knee replacement status  . Difficulty in walking  . Knee stiffness  . Knee pain    PT - End of Session Activity Tolerance: Patient tolerated treatment well General Behavior During Session: [facility] for tasks performed Cognition: [facility] for tasks performed  GP    Juel Burrow 07/05/2012, 3:35 PM

## 2012-07-08 ENCOUNTER — Ambulatory Visit (HOSPITAL_COMMUNITY)
Admission: RE | Admit: 2012-07-08 | Discharge: 2012-07-08 | Disposition: A | Discharge status: Home / Self Care | Payer: Medicare Other | Source: Ambulatory Visit | Attending: Orthopedic Surgery | Admitting: Orthopedic Surgery

## 2012-07-08 NOTE — Progress Notes (Signed)
Physical Therapy Treatment Patient Details  Name: [name] MRN: [id number] Date of Birth: 01-18-1956  Today's Date: 07/08/2012 Time: 8413-2440 PT Time Calculation (min): 54 min Charges: 29' TE, 15' Manual, 1 ice Visit#: 3  of 12   Re-eval: 08/01/12 Assessment Diagnosis: L TKR Surgical Date: 06/12/12 Next MD Visit: Dr. Eulah Pont - 07/25/12  Authorization: Medicare  Authorization Time Period:    Authorization Visit#: 3  of 10    Subjective: Symptoms/Limitations Symptoms: She reports that the MD is sure it is a subdural hematoma and is not concerned about his redness. Pt reports that she was very sore the following day after last visit.  Pain Assessment Currently in Pain?: Yes Pain Score:   4 Pain Location: Knee Pain Orientation: Left  Precautions/Restrictions     Exercise/Treatments Aerobic Stationary Bike: x10 backwards; 6' forward for ROM Standing Heel Raises: 15 reps Heel Raises Limitations: 0 HHA, toe raises 15 reps Knee Flexion: Left;10 reps;Limitations Knee Flexion Limitations: 2# Functional Squat Limitations: L knee only w/partial squat x15 Rocker Board: 2 minutes;Limitations Rocker Board Limitations: R<>L Gait Training: Tandem Gait 1 RT, Retro Gait 2 RT, Side stepping 2 RT  Modalities Modalities: Cryotherapy Manual Therapy Manual Therapy: Joint mobilization Joint Mobilization: Grade I-III AP to increase knee flexion in supine w/STM to knee afterwards.  Cryotherapy Number Minutes Cryotherapy: 10 Minutes Cryotherapy Location: Knee  Physical Therapy Assessment and Plan PT Assessment and Plan Clinical Impression Statement: Pt continues to improve overall LE ROM.  Continues have antalgic gait and requires max cueing for appropriate posture.  PT Plan: Continue with strengthening and ROM to decrease pain.  Continue to address pain to BLE.     Goals    Problem List Patient Active Problem List  Diagnosis  . Muscle weakness (generalized)  . Total knee  replacement status  . Difficulty in walking  . Knee stiffness  . Knee pain    PT - End of Session Activity Tolerance: Patient tolerated treatment well General Behavior During Session: [facility] for tasks performed Cognition: [facility] for tasks performed  GP    Azizi Bally 07/08/2012, 4:49 PM

## 2012-07-10 ENCOUNTER — Ambulatory Visit (HOSPITAL_COMMUNITY)
Admission: RE | Admit: 2012-07-10 | Discharge: 2012-07-10 | Disposition: A | Discharge status: Home / Self Care | Payer: Medicare Other | Source: Ambulatory Visit | Attending: Orthopedic Surgery | Admitting: Orthopedic Surgery

## 2012-07-10 NOTE — Progress Notes (Addendum)
Physical Therapy Treatment Patient Details  Name: [name] MRN: [id number] Date of Birth: May 09, 1956  Today's Date: 07/10/2012 Time: 4132-4401 PT Time Calculation (min): 54 min Charges: 32' TE, 12' Manual, 1 ice Visit#: 4 of 12   Re-eval: 08/01/12    Authorization: Medicare  Authorization Time Period:    Authorization Visit#: 5  of 10    Subjective: Symptoms/Limitations Symptoms: She reports that she is still a little sore after last visit.   Exercise/Treatments Stretches Knee: Self-Stretch to increase Flexion: 3 reps;30 seconds Gastroc Stretch: 3 reps;30 seconds (slant board) Soleus Stretch: 3 reps;30 seconds Aerobic Stationary Bike: x10 backwards; 8' forward for ROM Standing Knee Flexion: Left;15 reps Knee Flexion Limitations: 2# Rocker Board: 2 minutes Rocker Board Limitations: R<>L  w/HHA Other Standing Knee Exercises: Heel and toe walking 1 RT each Seated Heel Slides: Left;10 reps (10 sec holds)  Modalities Modalities: Cryotherapy Manual Therapy Manual Therapy: Myofascial release Myofascial Release: SEATED: to anterior L knee to decrease fascial restrictions x12 minutes Cryotherapy Number Minutes Cryotherapy: 10 Minutes Cryotherapy Location: Knee  Physical Therapy Assessment and Plan PT Assessment and Plan Clinical Impression Statement: Pt continues to improve overall functional ROM, however continues to require max cueing for proper knee flexion during swing phase.  PT Plan: Continue with strengthening and ROM to decrease pain.  Continue to address pain to BLE.     Goals    Problem List Patient Active Problem List  Diagnosis  . Muscle weakness (generalized)  . Total knee replacement status  . Difficulty in walking  . Knee stiffness  . Knee pain    PT - End of Session Activity Tolerance: Patient tolerated treatment well General Behavior During Session: [facility] for tasks performed Cognition: [facility] for tasks performed  GP    Glennys Schorsch 07/10/2012, 2:00 PM

## 2012-07-12 ENCOUNTER — Ambulatory Visit (HOSPITAL_COMMUNITY)
Admission: RE | Admit: 2012-07-12 | Discharge: 2012-07-12 | Disposition: A | Discharge status: Home / Self Care | Payer: Medicare Other | Source: Ambulatory Visit | Attending: Orthopedic Surgery | Admitting: Orthopedic Surgery

## 2012-07-12 ENCOUNTER — Ambulatory Visit (HOSPITAL_COMMUNITY): Payer: Medicare Other | Admitting: Physical Therapy

## 2012-07-12 NOTE — Progress Notes (Signed)
Physical Therapy Treatment Patient Details  Name: [name] MRN: [id number] Date of Birth: 1955/11/02  Today's Date: 07/12/2012 Time: 4098-1191 PT Time Calculation (min): 46 min Charges: 30' Manual, 8' [place], 5' TE Visit#: 5  of 12   Re-eval: 08/01/12    Authorization: Medicare  Authorization Time Period:    Authorization Visit#: 5  of 10    Subjective: Symptoms/Limitations Symptoms: Pt reports her pain is an 8/10 for the past two days.  She did not think she should come in because she had so much pain.  Pain Assessment Currently in Pain?: Yes Pain Score:   8 Pain Location: Knee Pain Orientation: Left  Precautions/Restrictions     Exercise/Treatments  Stretches Active Hamstring Stretch: 3 reps;30 seconds Gastroc Stretch: 3 reps;30 seconds  Modalities Modalities: Ultrasound Manual Therapy Manual Therapy: Myofascial release Myofascial Release: L anterior and posterior knee to decrease fascial restrictions especially along fibular head w/STM after to decrease pain.  x30 minutes.  Ultrasound Ultrasound Location: L knee posterior over fibular head/distal hamstring inserition Ultrasound Parameters: Theramal: 8 min, 3 mhz (medium head), continuous 1.2 w/cm2  Ultrasound Goals: Pain  Physical Therapy Assessment and Plan PT Assessment and Plan Clinical Impression Statement: Pt with significant reduction in fascial restrictions and able to tolerate deeper pressure to fibular head after manual techniques and [place] today.  Continues to have greatest limitiations due to pain to B knees.  Discussed in detail about continuing with open chain activities to decrease pain to R knee and cont strengtheing L LE.  PT Plan: Continue to focus on improving L LE strength and avoid flaring up RLE.  Continue with open chain activities as much as possible and add weights.     Goals    Problem List Patient Active Problem List  Diagnosis  . Muscle weakness (generalized)  . Total knee  replacement status  . Difficulty in walking  . Knee stiffness  . Knee pain    PT - End of Session Activity Tolerance: Patient tolerated treatment well General Behavior During Session: [facility] for tasks performed Cognition: [facility] for tasks performed  GP    [name] 07/12/2012, 11:36 AM

## 2012-07-15 ENCOUNTER — Ambulatory Visit (HOSPITAL_COMMUNITY)
Admission: RE | Admit: 2012-07-15 | Discharge: 2012-07-15 | Disposition: A | Discharge status: Home / Self Care | Payer: Medicare Other | Source: Ambulatory Visit | Attending: Orthopedic Surgery | Admitting: Orthopedic Surgery

## 2012-07-15 NOTE — Progress Notes (Addendum)
Physical Therapy Treatment Patient Details  Name: [name] MRN: [id number] Date of Birth: October 08, 1955  Today's Date: 07/15/2012 Time: 1300-1345 PT Time Calculation (min): 45 min There ex 19; gt x 10 ; [place] x 8 Visit#: 6  of 12   Re-eval: 08/01/12   Authorization: Medicare  Authorization Time Period:    Authorization Visit#: 6  of 10    Subjective: Symptoms/Limitations Symptoms: Pt questions why she can not walk fast  Pain Assessment Currently in Pain?: Yes Pain Score:   2 (went up to a 6 after ambulation.) Pain Orientation: Left  Exercise/Treatments   Aerobic Tread Mill: work up to 2.0 mph x 6'  Supine Quad Sets: 15 reps Terminal Knee Extension: AAROM;10 reps  Prone  Knee flexion x 10 Contract/Relax to Increase Flexion: x5  Modalities Modalities: Ultrasound Ultrasound Ultrasound Location: L posterior over fibular head Ultrasound Parameters: 8 min, 3 mhz 1.2 w/cm2  Physical Therapy Assessment and Plan PT Assessment and Plan Clinical Impression Statement: Pt able to ambulate at increased cadence just needing encouragement.  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 once off TM;  Pt tenderness has decreased since Friday but still c/o posterior lateral pain..  Pt still has significant weakened terminal extension.  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      Problem List Patient Active Problem List  Diagnosis  . Muscle weakness (generalized)  . Total knee replacement status  . Difficulty in walking  . Knee stiffness  . Knee pain    PT - End of Session Activity Tolerance: Patient tolerated treatment well  GP    RUSSELL,CINDY 07/15/2012, 1:59 PM

## 2012-07-17 ENCOUNTER — Ambulatory Visit (HOSPITAL_COMMUNITY): Payer: Medicare Other | Admitting: Physical Therapy

## 2012-07-17 ENCOUNTER — Ambulatory Visit (HOSPITAL_COMMUNITY)
Admission: RE | Admit: 2012-07-17 | Discharge: 2012-07-17 | Disposition: A | Discharge status: Home / Self Care | Payer: Medicare Other | Source: Ambulatory Visit | Attending: Orthopedic Surgery | Admitting: Orthopedic Surgery

## 2012-07-17 NOTE — Progress Notes (Signed)
Physical Therapy Treatment Patient Details  Name: [name] MRN: [id number] Date of Birth: [birth year]  Today's Date: 07/17/2012 Time: 0935-1024 PT Time Calculation (min): 49 min  Visit#: 7  of 12   Re-eval: 08/01/12 Charges: Gait x 8' Manual x 20'   Authorization: Medicare  Authorization Visit#: 7  of 10    Subjective: Symptoms/Limitations Symptoms: Pt states that she would like to take it easy today because she has to drive to [facility] today. Pain Assessment Currently in Pain?: Yes Pain Score:   5 Pain Location: Knee Pain Orientation: Left   Exercise/Treatments Aerobic Tread Mill: 1.6-2.0 x 8'    Manual Therapy Manual Therapy: Myofascial release Myofascial Release: L anterior and posterior knee to decrease fascial restrictions especially along fibular head w/STM after to decrease pain. x20' Ultrasound Ultrasound Location: L posterior over fibular head Ultrasound Parameters: 8 min 3 mhz 1.2 w/cm2  Ultrasound Goals: Pain [place] completed by Nicholes Rough, PTT Physical Therapy Assessment and Plan PT Assessment and Plan Clinical Impression Statement: Pt requires vc's to improve heel toes patter and posture on TM. TX focus on decreasing pain as pt has to drive a long distance today. Posterior/lateral knee continues to present with tightness and adhesions. PT Plan: Continue to progress per PT POC.     Problem List Patient Active Problem List  Diagnosis  . Muscle weakness (generalized)  . Total knee replacement status  . Difficulty in walking  . Knee stiffness  . Knee pain    PT - End of Session Activity Tolerance: Patient tolerated treatment well General Behavior During Session: [facility] for tasks performed Cognition: [facility] for tasks performed  Seth Bake, PTA 07/17/2012, 5:36 PM

## 2012-07-19 ENCOUNTER — Ambulatory Visit (HOSPITAL_COMMUNITY)
Admission: RE | Admit: 2012-07-19 | Discharge: 2012-07-19 | Disposition: A | Discharge status: Home / Self Care | Payer: Medicare Other | Source: Ambulatory Visit | Attending: Orthopedic Surgery | Admitting: Orthopedic Surgery

## 2012-07-19 NOTE — Progress Notes (Signed)
Physical Therapy Treatment Patient Details  Name: [name] MRN: [id number] Date of Birth: [birth year]  Today's Date: 07/19/2012 Time: 4098-1191 PT Time Calculation (min): 45 min Charges: 30' Manual, 15' TE Visit#: 8  of 12   Re-eval: 08/01/12    Authorization: Medicare  Authorization Time Period:    Authorization Visit#: 8  of 10    Subjective: Symptoms/Limitations Symptoms: When will my pain get better?  I am sick of this pain. Pt reports that she became stiff after sitting in the car and in the chair on the way to [facility]. Pain Assessment Currently in Pain?: Yes Pain Score:   6 Pain Location: Knee Pain Orientation: Left  Exercise/Treatments Aerobic Tread Mill: 1.6-1.8 x 6' for gait training   Standing Terminal Knee Extension: Left;10 reps;Other (comment) (5 sec holds) Supine Terminal Knee Extension: 5 reps Sidelying Hip ADduction: 15 reps Prone Prone Hang: x3 minutes  Manual Therapy Manual Therapy: Myofascial release Joint Mobilization: Grade I-II AP abd PA to L fibular head to increase knee flexion in supine w/STM to knee afterwards Myofascial Release: L anterior and posterior knee to decrease fascial restrictions especially along fibular head w/STM after to decrease pain. x30'  Ultrasound Ultrasound Goals: Pain  Physical Therapy Assessment and Plan PT Assessment and Plan Clinical Impression Statement: Pt continues to improve overall ROM and has greater awarness of gait mechanics.  Has improved distal quad activation and performs supine TKE and ability to lift heel off of mat.  PT Plan: Continue to progress per PT POC.    Goals    Problem List Patient Active Problem List  Diagnosis  . Muscle weakness (generalized)  . Total knee replacement status  . Difficulty in walking  . Knee stiffness  . Knee pain    PT - End of Session Activity Tolerance: Patient tolerated treatment well General Behavior During Session: [facility] for tasks performed Cognition:  [facility] for tasks performed  GP    Daiana Vitiello 07/19/2012, 2:05 PM

## 2012-07-22 ENCOUNTER — Ambulatory Visit (HOSPITAL_COMMUNITY)
Admission: RE | Admit: 2012-07-22 | Discharge: 2012-07-22 | Disposition: A | Discharge status: Home / Self Care | Payer: Medicare Other | Source: Ambulatory Visit | Attending: Orthopedic Surgery | Admitting: Orthopedic Surgery

## 2012-07-22 NOTE — Progress Notes (Signed)
Physical Therapy Treatment Patient Details  Name: [name] MRN: [id number] Date of Birth: 11/13/1955  Today's Date: 07/22/2012 Time: 8295-6213 (US done by Nicholes Rough, PTT) PT Time Calculation (min): 62 min Visit#: 9  of 12   Re-eval: 08/01/12 Authorization: Medicare  Authorization Visit#: 9  of 10   Charges:  Gait 10', therex 12', manual 25', [place] 8' (n/c done by PTT)  Subjective: Symptoms/Limitations Symptoms: Pt. states she took a pain pill this morning and one about 45 minutes ago.  States most pain is lateral L knee, ranges 3-5/10 and hurts worse when up and moving about Pain Assessment Pain Score:   5 Pain Location: Knee Pain Orientation: Left   Exercise/Treatments Aerobic Tread Mill: 1.6-1.8 x 6' for gait training   Standing Heel Raises: 10 reps;Limitations Heel Raises Limitations: toeraises 10 reps Terminal Knee Extension: Left;10 reps;Other (comment) Supine Terminal Knee Extension: 10 reps;Limitations Terminal Knee Extension Limitations: with small roll Sidelying Hip ADduction: 15 reps   Modalities Modalities: Ultrasound Manual Therapy Manual Therapy: Other (comment) Myofascial Release: L anterior (in supine) and posterior knee (in prone) to decrease fascial restrictions especially along fibular head w/STM after to decrease pain. x30'  Cryotherapy Cryotherapy Location: Upper arm Ultrasound Ultrasound Location: L lateral /posterior knee at protrusion while in prone Ultrasound Parameters: 8 min 3 mhz 1.2 w/cm2  Ultrasound Goals: Pain  Physical Therapy Assessment and Plan PT Assessment and Plan Clinical Impression Statement: fourth [place] completed today.  Pt. with greater adhesions anterior knee than posterior/lateral today but able to loosen with MFR.  Pt. c/o some discomfort during MFR but overall improvement following.  Per PT, continued [place] to lateral posterior knee at protrusion for pain/spasm.  dicussed possibility of adding IFES in the future if pain  does not subside/ [place] doesnt help by 6th treatment.   PT Plan: Continue to progress per PT POC.  G Codes next visit.     Problem List Patient Active Problem List  Diagnosis  . Muscle weakness (generalized)  . Total knee replacement status  . Difficulty in walking  . Knee stiffness  . Knee pain    PT - End of Session Activity Tolerance: Patient tolerated treatment well General Behavior During Session: [facility] for tasks performed Cognition: [facility] for tasks performed   Lurena Nida, PTA/CLT 07/22/2012, 3:24 PM

## 2012-07-24 ENCOUNTER — Ambulatory Visit (HOSPITAL_COMMUNITY)
Admission: RE | Admit: 2012-07-24 | Discharge: 2012-07-24 | Disposition: A | Discharge status: Home / Self Care | Payer: Medicare Other | Source: Ambulatory Visit | Attending: Orthopedic Surgery | Admitting: Orthopedic Surgery

## 2012-07-24 NOTE — Evaluation (Signed)
Physical Therapy Re-Evaluation  Patient Details  Name: [name] MRN: [id number] Date of Birth: March 14, 1956  Today's Date: 07/24/2012 Time: 1310-1415 (Treatment completed by PTA (RS) except of end of session measurements and TM walking (completed by PT LM) PT Time Calculation (min): 65 min Charges: 1 ROM, 1 MMT, 10' manual, 8' [place] Visit#: 10  of 16   Re-eval: 08/23/12   Authorization: Medicare  Authorization Time Period:    Authorization Visit#: 10  of 20    Subjective Symptoms/Limitations Symptoms: Pt reports that she went to [employer] yesterday and has been very busy this morning with chores, which she typically does not do.  Pain Assessment Currently in Pain?: Yes Pain Score:   6 Pain Location: Knee Pain Orientation: Left  LLE AROM (degrees) Left Knee Extension: 0  (was 0) Left Knee Flexion: 109  (was 100)  LLE PROM (degrees) Left Knee Extension: 0 (was 0) Left Knee Flexion: 110 (was 106)  LLE Strength Left Hip Flexion: 5/5 (was 4/5) Left Hip Extension:  (4+/5 was 4/5) Left Hip ABduction:  (4+/5 was 3+/5) Left Hip ADduction: 5/5 (was 3+/5) Left Knee Flexion: 5/5 (was 3+/5) Left Knee Extension: 5/5 (was 3+/5) Left Ankle Dorsiflexion: 5/5  Mobility/Balance  Ambulation/Gait Ambulation/Gait Assistance: 7: Independent (was mod I w/SPC) Gait Pattern: Step-through pattern;Decreased hip/knee flexion - left;Decreased stance time - left;Trunk flexed Static Standing Balance Single Leg Stance - Left Leg: 24  (was 2 sec)   Exercise/Treatments Stretches ITB Stretch: 3 reps;30 seconds Aerobic  TM @ 1.84mph x5 minutes  Modalities Modalities: Ultrasound Manual Therapy Manual Therapy: Other (comment) Myofascial Release: L anterior (in supine) and posterior knee (in prone) to decrease fascial restrictions especially along fibular head w/STM after to decrease pain. Ultrasound Ultrasound Location: L lateral knee in sidelying Ultrasound Parameters: 8 min 3 mhz 1.2 w/cm2   Ultrasound Goals: Pain  Physical Therapy Assessment and Plan PT Assessment and Plan Clinical Impression Statement: Re-eval complete, G-code updated. Spasm behind fibular head has resolved. Pt continues to have pain in lateral knee and sensitivity to touch in ITB. Began supine ITB stretch with rope.  Ms. Mckellar has attended 10 OP PT visits s/p L TKR w/following findings: AROM: 0-105, strength WFL, improved balance, continues to have greatest difficulty w/gait abnormalities and increased pain.  Discussed with pt to talk with doctor about alternative medications to help decrease her nerve pain.  She is currently ambulating independently and is able to shop with moderate discomfort.  Pt will benefit from skilled therapeutic intervention in order to improve on the following deficits: Abnormal gait;Pain;Decreased range of motion;Decreased activity tolerance Rehab Potential: Good PT Frequency: Min 3X/week PT Duration:  (2 weeks) PT Treatment/Interventions: Gait training;Stair training;Functional mobility training;Therapeutic activities;Therapeutic exercise;Balance training;Neuromuscular re-education PT Plan: Continue to address pt pain, ROM and improve gait mechanics.  Pt is independent with HEP for strength.  Add high volt e-stim and ice next session for pain control.     Goals Home Exercise Program Pt will Perform Home Exercise Program: Independently PT Short Term Goals Time to Complete Short Term Goals: 3 weeks PT Short Term Goal 1: Pt will report pain less than 3/10 for 75% of her day for improved QOL.  PT Short Term Goal 1 - Progress: Not met PT Short Term Goal 2: Pt will improve her L LE strength WNL.  PT Short Term Goal 2 - Progress: Progressing toward goal PT Short Term Goal 3: Pt will ambulate independently with mild gait impairments. PT Short Term Goal 3 -  Progress: Progressing toward goal PT Short Term Goal 4: Pt will improve her L knee AROM: 0-110 degrees PT Short Term Goal 4 -  Progress: Progressing toward goal PT Short Term Goal 5: Pt will present with decreased redness to her lateral knee.  PT Short Term Goal 5 - Progress: Met PT Long Term Goals Time to Complete Long Term Goals: Other (comment) (6 weeks) PT Long Term Goal 1: Pt will improve her LE strength to [facility] in order to ascend and descend stairs w/1 handrail w/reciprical pattern with pain no greater than 4/10.  PT Long Term Goal 1 - Progress: Progressing toward goal PT Long Term Goal 2: Pt will improve her LE strength in order to ambulate independently with mild gait impairments to decrease secondary injuries.  PT Long Term Goal 2 - Progress: Progressing toward goal Long Term Goal 3: Pt will report pain to her L knee and ankle less than 3/10 for 75% of her day for improved QOL Long Term Goal 3 Progress: Not met Long Term Goal 4: Pt will improve her L knee AROM to WNL in order to ambulate with apporpriate gait mechanics.  Long Term Goal 4 Progress: Progressing toward goal PT Long Term Goal 5: Pt will improve LE static standing balance x30 sec on R and L LE.  Long Term Goal 5 Progress: Progressing toward goal  Problem List Patient Active Problem List  Diagnosis  . Muscle weakness (generalized)  . Total knee replacement status  . Difficulty in walking  . Knee stiffness  . Knee pain    PT - End of Session Activity Tolerance: Patient tolerated treatment well General Behavior During Session: [facility] for tasks performed Cognition: [facility] for tasks performed PT Plan of Care PT Patient Instructions: discussed importance of proper gait mechancis and f/u w/MD about pain medications.  Consulted and Agree with Plan of Care: Patient  GP Functional Assessment Tool Used: self report  Functional Limitation: Mobility: Walking and moving around Mobility: Walking and Moving Around Current Status [phone]): At least 20 percent but less than 40 percent impaired, limited or restricted Mobility: Walking and Moving Around Goal  Status [phone]): At least 1 percent but less than 20 percent impaired, limited or restricted  Luellen Howson, PT 07/24/2012, 3:00 PM  Physician Documentation Your signature is required to indicate approval of the treatment plan as stated above.  Please sign and either send electronically or make a copy of this report for your files and return this physician signed original.   Please mark one 1.__approve of plan  2. ___approve of plan with the following conditions.   ______________________________                                                          _____________________ Physician Signature  Date  

## 2012-07-26 ENCOUNTER — Ambulatory Visit (HOSPITAL_COMMUNITY)
Admission: RE | Admit: 2012-07-26 | Discharge: 2012-07-26 | Disposition: A | Discharge status: Home / Self Care | Payer: Medicare Other | Source: Ambulatory Visit | Attending: Orthopedic Surgery | Admitting: Orthopedic Surgery

## 2012-07-26 NOTE — Progress Notes (Signed)
Physical Therapy Treatment Patient Details  Name: [name] MRN: [id number] Date of Birth: [birth year]  Today's Date: 07/26/2012 Time: 1302-1400 PT Time Calculation (min): 58 min Charges: 32' TE, 15' Manual, 1 estim, 1 ice Visit#: 11  of 16   Re-eval: 08/23/12 Assessment Diagnosis: L TKR Surgical Date: 06/12/12 Next MD Visit: Dr. Eulah Pont - 07/30/12  Authorization: Medicare  Authorization Time Period:    Authorization Visit#: 11  of 20    Subjective: Symptoms/Limitations Symptoms: Pt reports that she was busy making a lot of food yesterday and was standing on my leg a lot.  I am a little sore today.  Pain Assessment Currently in Pain?: Yes Pain Score:   6 Pain Location: Knee Pain Orientation: Left  Exercise/Treatments Aerobic Tread Mill: gait trainer 0.50 cyc/sec x8 minutes for activity tolerance 0.50 cyc/sec x8 minutes for activity tolerance Machines for Strengthening Cybex Knee Extension: LLE only 1 Pl x5  Cybex Knee Flexion: LLE only 2 PL x5 w/5-10 sec hlds Supine Heel Slides: 15 reps;Limitations Heel Slides Limitations: 10 sec holds on wall Bridges: 10 reps;Limitations Bridges Limitations: on green ball; roll ups x5 w/A   Modalities Modalities: Electrical Stimulation Cryotherapy Number Minutes Cryotherapy: 10 Minutes Cryotherapy Location: Knee Type of Cryotherapy: Ice pack Pharmacologist Location: Hi Volt to L distal quadricep x10 minutes 90 volts Electrical Stimulation Goals: Edema  Physical Therapy Assessment and Plan PT Assessment and Plan Clinical Impression Statement: Pt able to gait 110 degrees of L knee flexion with wall heel slide w/AAROM.  She continues to be limited by pain to B LE.  Continued strengthening w/open chain activities to decrease R knee pain.  PT Duration:  (2 weeks) PT Plan: f/u on high volt.  Continue with improving gait mechanics and knee flexion    Goals    Problem List Patient Active Problem  List  Diagnosis  . Muscle weakness (generalized)  . Total knee replacement status  . Difficulty in walking  . Knee stiffness  . Knee pain    PT - End of Session Activity Tolerance: Patient tolerated treatment well General Behavior During Session: [facility] for tasks performed Cognition: [facility] for tasks performed  [name], PT 07/26/2012, 2:16 PM

## 2012-07-29 ENCOUNTER — Ambulatory Visit (HOSPITAL_COMMUNITY)
Admission: RE | Admit: 2012-07-29 | Discharge: 2012-07-29 | Disposition: A | Discharge status: Home / Self Care | Payer: Medicare Other | Source: Ambulatory Visit | Attending: Orthopedic Surgery | Admitting: Orthopedic Surgery

## 2012-07-29 NOTE — Evaluation (Signed)
Physical Therapy Evaluation  Patient Details  Name: [name] MRN: [id number] Date of Birth: Jul 10, 1955  Today's Date: 07/29/2012 Time: 1302-1338 PT Time Calculation (min): 36 min Charges: 1 ROM, 30' Self Care Visit#: 12  of 16   Re-eval: 08/23/12 Assessment Diagnosis: L TKR Surgical Date: 06/12/12 Next MD Visit: Dr. Eulah Pont - 07/30/12  Authorization: Medicare  Authorization Time Period:    Authorization Visit#: 12  of 20    Subjective Symptoms/Limitations Symptoms: Pt reports that she feels like she is doing pretty well and would like to consider D/C from PT as she is compliant with her HEP.  How long can you walk comfortably?: 2 hours   Cognition/Observation  Continues to have redness to lateral knee region.   Sensation/Coordination/Flexibility/Functional Tests  LEFS: 66/80 Assessment LLE AROM (degrees) Left Knee Extension: 0  Left Knee Flexion: 115   Exercise/Treatments Stretches  Self Stretch 3x60 sec on 8 in. stair  Physical Therapy Assessment and Plan PT Assessment and Plan Clinical Impression Statement: Pt is being D/C from PT due to pt request with the following findings: L knee AROM: 0-115; strength WNL, balance is improving and continues to have mild gait abnormalities which improves with cueing. Pt continues to be limitied by her R knee pain and is independent with her HEP.   PT Duration:  (2 weeks) PT Plan: D/C    Goals Home Exercise Program Pt will Perform Home Exercise Program: Independently PT Goal: Perform Home Exercise Program - Progress: Met PT Short Term Goals Time to Complete Short Term Goals: 3 weeks PT Short Term Goal 1: Pt will report pain less than 3/10 for 75% of her day for improved QOL.  PT Short Term Goal 1 - Progress: Met (2-3/10.  Taking one pain pill) PT Short Term Goal 2: Pt will improve her L LE strength WNL.  PT Short Term Goal 2 - Progress: Met PT Short Term Goal 3: Pt will ambulate independently with mild gait  impairments. PT Short Term Goal 3 - Progress: Progressing toward goal PT Short Term Goal 4: Pt will improve her L knee AROM: 0-110 degrees PT Short Term Goal 4 - Progress: Met (0-115) PT Short Term Goal 5: Pt will present with decreased redness to her lateral knee.  PT Short Term Goal 5 - Progress: Progressing toward goal PT Long Term Goals Time to Complete Long Term Goals: Other (comment) (6 weeks) PT Long Term Goal 1: Pt will improve her LE strength to [facility] in order to ascend and descend stairs w/1 handrail w/reciprical pattern with pain no greater than 4/10.  PT Long Term Goal 1 - Progress: Met PT Long Term Goal 2: Pt will improve her LE strength in order to ambulate independently with mild gait impairments to decrease secondary injuries.  PT Long Term Goal 2 - Progress: Progressing toward goal Long Term Goal 3: Pt will report pain to her L knee and ankle less than 3/10 for 75% of her day for improved QOL Long Term Goal 3 Progress: Met Long Term Goal 4: Pt will improve her L knee AROM to WNL in order to ambulate with apporpriate gait mechanics.  Long Term Goal 4 Progress: Met PT Long Term Goal 5: Pt will improve LE static standing balance x30 sec on R and L LE.  Long Term Goal 5 Progress: Progressing toward goal (24 sec)  Problem List Patient Active Problem List  Diagnosis  . Muscle weakness (generalized)  . Total knee replacement status  . Difficulty in walking  .  Knee stiffness  . Knee pain    PT - End of Session Activity Tolerance: Patient tolerated treatment well General Behavior During Session: [facility] for tasks performed Cognition: [facility] for tasks performed PT Plan of Care PT Patient Instructions: Discuseed in great detail about her progression of pain/stiffness and expectations over the next year.  encouraged appropriate gait mechanics and continuing with her HEP. Completed LEFS Consulted and Agree with Plan of Care: Patient  GP Functional Assessment Tool Used: self report;  LEFS   Functional Limitation: Mobility: Walking and moving around Mobility: Walking and Moving Around Goal Status [phone]): At least 1 percent but less than 20 percent impaired, limited or restricted Mobility: Walking and Moving Around Discharge Status [phone]): At least 1 percent but less than 20 percent impaired, limited or restricted  Kolleen Ochsner 07/29/2012, 1:44 PM  Physician Documentation Your signature is required to indicate approval of the treatment plan as stated above.  Please sign and either send electronically or make a copy of this report for your files and return this physician signed original.   Please mark one 1.__approve of plan  2. ___approve of plan with the following conditions.   ______________________________                                                          _____________________ Physician Signature                                                                                                             Date

## 2012-07-31 ENCOUNTER — Ambulatory Visit (HOSPITAL_COMMUNITY): Payer: Medicare Other | Admitting: Physical Therapy

## 2012-08-02 ENCOUNTER — Ambulatory Visit (HOSPITAL_COMMUNITY): Payer: Medicare Other | Admitting: Physical Therapy

## 2013-03-12 ENCOUNTER — Other Ambulatory Visit: Payer: Self-pay | Admitting: Obstetrics and Gynecology

## 2013-03-13 ENCOUNTER — Other Ambulatory Visit: Payer: Self-pay | Admitting: Orthopedic Surgery

## 2013-03-19 ENCOUNTER — Encounter (HOSPITAL_COMMUNITY): Payer: Self-pay | Admitting: Pharmacy Technician

## 2013-03-20 NOTE — Pre-Procedure Instructions (Signed)
ISEBELLA UPSHUR  03/20/2013   Your procedure is scheduled on:  Wednesday, Sept. 24th   Report to [facility] Stay [facility] at 10:30 AM.             Bonita Quin will come through Entrance "A")  Call this number if you have problems the morning of surgery: [phone]   Remember:   Do not eat food or drink liquids after midnight Tuesday.   Take these medicines the morning of surgery with A SIP OF WATER: Tramadol   Do not wear jewelry, make-up or nail polish.  Do not wear lotions, powders, or perfumes. You may NOT  wear deodorant.  Do not shave underarms & legs 48 hours prior to surgery.    Do not bring valuables to the hospital.  [facility] is not responsible for any belongings or valuables.  Contacts, dentures or bridgework may not be worn into surgery.   Leave suitcase in the car. After surgery it may be brought to your room.  For patients admitted to the hospital, checkout time is 11:00 AM the day of discharge.   Name and phone number of your driver:    Special Instructions: Shower using CHG 2 nights before surgery and the night before surgery.  If you shower the day of surgery use CHG.  Use special wash - you have one bottle of CHG for all showers.  You should use approximately 1/3 of the bottle for each shower.   Please read over the following fact sheets that you were given: Pain Booklet, Coughing and Deep Breathing, Blood Transfusion Information, MRSA Information and Surgical Site Infection Prevention

## 2013-03-21 ENCOUNTER — Ambulatory Visit (HOSPITAL_COMMUNITY)
Admission: RE | Admit: 2013-03-21 | Discharge: 2013-03-21 | Disposition: A | Discharge status: Home / Self Care | Payer: Medicare Other | Source: Ambulatory Visit | Attending: Orthopedic Surgery | Admitting: Orthopedic Surgery

## 2013-03-21 ENCOUNTER — Other Ambulatory Visit: Payer: Self-pay | Admitting: Obstetrics and Gynecology

## 2013-03-21 ENCOUNTER — Encounter (HOSPITAL_COMMUNITY)
Admission: RE | Admit: 2013-03-21 | Discharge: 2013-03-21 | Disposition: A | Discharge status: Home / Self Care | Payer: Medicare Other | Source: Ambulatory Visit | Attending: Orthopedic Surgery | Admitting: Orthopedic Surgery

## 2013-03-21 ENCOUNTER — Encounter (HOSPITAL_COMMUNITY): Payer: Self-pay

## 2013-03-21 DIAGNOSIS — Z01812 Encounter for preprocedural laboratory examination: Secondary | ICD-10-CM | POA: Insufficient documentation

## 2013-03-21 DIAGNOSIS — Z01818 Encounter for other preprocedural examination: Secondary | ICD-10-CM | POA: Insufficient documentation

## 2013-03-21 DIAGNOSIS — Z01810 Encounter for preprocedural cardiovascular examination: Secondary | ICD-10-CM | POA: Insufficient documentation

## 2013-03-21 LAB — CBC WITH DIFFERENTIAL/PLATELET
Basophils Absolute: 0 10*3/uL (ref 0.0–0.1)
Basophils Relative: 1 % (ref 0–1)
Eosinophils Relative: 1 % (ref 0–5)
Lymphocytes Relative: 25 % (ref 12–46)
MCHC: 34 g/dL (ref 30.0–36.0)
Monocytes Absolute: 0.5 10*3/uL (ref 0.1–1.0)
Neutro Abs: 3.9 10*3/uL (ref 1.7–7.7)
Platelets: 343 10*3/uL (ref 150–400)
RDW: 13.1 % (ref 11.5–15.5)
WBC: 6 10*3/uL (ref 4.0–10.5)

## 2013-03-21 LAB — COMPREHENSIVE METABOLIC PANEL
ALT: 7 U/L (ref 0–35)
AST: 16 U/L (ref 0–37)
Albumin: 4.3 g/dL (ref 3.5–5.2)
CO2: 23 mEq/L (ref 19–32)
Calcium: 9.7 mg/dL (ref 8.4–10.5)
Chloride: 101 mEq/L (ref 96–112)
Creatinine, Ser: 0.68 mg/dL (ref 0.50–1.10)
GFR calc non Af Amer: >90 mL/min (ref >90)
Sodium: 137 mEq/L (ref 135–145)

## 2013-03-21 LAB — URINALYSIS, ROUTINE W REFLEX MICROSCOPIC
Ketones, ur: 15 mg/dL — AB
Nitrite: NEGATIVE
Protein, ur: NEGATIVE mg/dL
Urobilinogen, UA: 0.2 mg/dL (ref 0.0–1.0)

## 2013-03-21 LAB — TYPE AND SCREEN: ABO/RH(D): O POS

## 2013-03-21 LAB — APTT: aPTT: 29 seconds (ref 24–37)

## 2013-03-21 LAB — URINE MICROSCOPIC-ADD ON

## 2013-03-21 LAB — PROTIME-INR: INR: 0.96 (ref 0.00–1.49)

## 2013-03-21 NOTE — Progress Notes (Signed)
Anesthesia PAT Evaluation:  Patient is a 57 year old female scheduled for left total knee revision on 03/26/13 by Dr. Luiz Blare.  Patient specifically requested to speak with an anesthesiologist during her PAT visit, so Dr. Noreene Larsson and I both met with her.  History includes former smoker, HLD, insomnia, arthritis, migraines, seasonal allergies, hysterectomy, appendectomy '08, left TKA by Dr. Eulah Pont on 06/12/12 (with femoral nerve block and GA/ETT). BMI 25.53. PCP is Dr. Karleen Hampshire in [place].  Anesthesia concerns:  She gets so worked up and anxious prior to surgery that she requests sedative type medication ASAP on arrival to [place] on the day of surgery.  This has been a huge issue in the past, even after taking Valium po. She had a previous breast surgery that was suppose to be done under MAC, but they were never able to get comfortable enough to do the procedure.  She says Dr. Greig Right PA told her that she was difficult to get under anesthesia for her left TKA, but anesthesia record don't really speak to this. She also had post-operative nausea for 3 weeks following her TKA in 06/2012, so requests anti-emetic medication.    Meds: simvastatin, Imitrex, Topamax, Ultram, trazadone. She has Valium 10 mg po that she plan to take on the morning of surgery.  EKG on 03/21/13 showed NSR.  CXR on 03/21/13 showed no active cardiopulmonary disease.  Preoperative labs noted.  UA showed moderate leukocytes, negative nitrites, small hgb. Patient stated that she had a PAP smear earlier today and has had some spotting. Urine culture is still pending.  Anticipate that she can proceed as planned.  Velna Ochs [facility] Short Stay Center/Anesthesiology Phone [phone] 03/21/2013 4:29 PM

## 2013-03-21 NOTE — Progress Notes (Addendum)
Patient denies any cardiac issues.                                                                                                                              Nasal Swab not performed as patient is allergic to Bactroban. Would like to speak with anesthesiologist concerning regarding heightened anxiety level prior to surgery and how to control this DOS.

## 2013-03-22 LAB — URINE CULTURE: Colony Count: 30000

## 2013-03-25 MED ORDER — POVIDONE-IODINE 7.5 % EX SOLN
Freq: Once | CUTANEOUS | Status: DC
  Filled 2013-03-25: qty 118

## 2013-03-25 MED ORDER — CEFAZOLIN SODIUM-DEXTROSE 2-3 GM-% IV SOLR
2.0000 g | INTRAVENOUS | Status: AC
  Administered 2013-03-26: 2 g via INTRAVENOUS
  Filled 2013-03-25: qty 50

## 2013-03-26 ENCOUNTER — Encounter (HOSPITAL_COMMUNITY)
Admission: RE | Disposition: A | Discharge status: Home Health | Payer: Self-pay | Source: Ambulatory Visit | Attending: Orthopedic Surgery

## 2013-03-26 ENCOUNTER — Inpatient Hospital Stay (HOSPITAL_COMMUNITY)
Admission: RE | Admit: 2013-03-26 | Discharge: 2013-03-27 | DRG: 468 | Disposition: A | Discharge status: Home Health | Payer: Medicare Other | Source: Ambulatory Visit | Attending: Orthopedic Surgery | Admitting: Orthopedic Surgery

## 2013-03-26 ENCOUNTER — Encounter (HOSPITAL_COMMUNITY): Payer: Self-pay | Admitting: Vascular Surgery

## 2013-03-26 ENCOUNTER — Encounter (HOSPITAL_COMMUNITY): Payer: Self-pay | Admitting: *Deleted

## 2013-03-26 ENCOUNTER — Inpatient Hospital Stay (HOSPITAL_COMMUNITY): Payer: Medicare Other | Admitting: Vascular Surgery

## 2013-03-26 DIAGNOSIS — Y831 Surgical operation with implant of artificial internal device as the cause of abnormal reaction of the patient, or of later complication, without mention of misadventure at the time of the procedure: Secondary | ICD-10-CM | POA: Diagnosis present

## 2013-03-26 DIAGNOSIS — Z96652 Presence of left artificial knee joint: Secondary | ICD-10-CM

## 2013-03-26 DIAGNOSIS — Z87891 Personal history of nicotine dependence: Secondary | ICD-10-CM

## 2013-03-26 DIAGNOSIS — T8489XA Other specified complication of internal orthopedic prosthetic devices, implants and grafts, initial encounter: Principal | ICD-10-CM | POA: Diagnosis present

## 2013-03-26 DIAGNOSIS — Z96659 Presence of unspecified artificial knee joint: Secondary | ICD-10-CM

## 2013-03-26 DIAGNOSIS — G4700 Insomnia, unspecified: Secondary | ICD-10-CM | POA: Diagnosis present

## 2013-03-26 DIAGNOSIS — T8484XA Pain due to internal orthopedic prosthetic devices, implants and grafts, initial encounter: Secondary | ICD-10-CM

## 2013-03-26 DIAGNOSIS — M25569 Pain in unspecified knee: Secondary | ICD-10-CM | POA: Diagnosis present

## 2013-03-26 DIAGNOSIS — Z79899 Other long term (current) drug therapy: Secondary | ICD-10-CM

## 2013-03-26 DIAGNOSIS — M25469 Effusion, unspecified knee: Secondary | ICD-10-CM | POA: Diagnosis present

## 2013-03-26 DIAGNOSIS — E785 Hyperlipidemia, unspecified: Secondary | ICD-10-CM | POA: Diagnosis present

## 2013-03-26 DIAGNOSIS — G43909 Migraine, unspecified, not intractable, without status migrainosus: Secondary | ICD-10-CM | POA: Diagnosis present

## 2013-03-26 HISTORY — PX: TOTAL KNEE REVISION: SHX996

## 2013-03-26 LAB — GRAM STAIN

## 2013-03-26 SURGERY — TOTAL KNEE REVISION
Anesthesia: Regional | Site: Knee | Laterality: Left | Wound class: Clean

## 2013-03-26 MED ORDER — PROMETHAZINE HCL 25 MG/ML IJ SOLN
INTRAMUSCULAR | Status: AC
  Filled 2013-03-26: qty 1

## 2013-03-26 MED ORDER — MEPERIDINE HCL 25 MG/ML IJ SOLN: 6.2500 mg | INTRAMUSCULAR | Status: DC | PRN

## 2013-03-26 MED ORDER — OXYCODONE-ACETAMINOPHEN 5-325 MG PO TABS
ORAL_TABLET | ORAL | Status: AC
  Filled 2013-03-26: qty 2

## 2013-03-26 MED ORDER — HYDROMORPHONE HCL PF 1 MG/ML IJ SOLN
0.2500 mg | INTRAMUSCULAR | Status: DC | PRN
  Administered 2013-03-26 (×4): 0.5 mg via INTRAVENOUS

## 2013-03-26 MED ORDER — HYDROMORPHONE HCL PF 1 MG/ML IJ SOLN
INTRAMUSCULAR | Status: AC
  Filled 2013-03-26: qty 1

## 2013-03-26 MED ORDER — CEFUROXIME SODIUM 1.5 G IJ SOLR
INTRAMUSCULAR | Status: AC
  Filled 2013-03-26: qty 1.5

## 2013-03-26 MED ORDER — METHOCARBAMOL 100 MG/ML IJ SOLN
500.0000 mg | Freq: Four times a day (QID) | INTRAVENOUS | Status: DC | PRN
  Filled 2013-03-26: qty 5

## 2013-03-26 MED ORDER — ONDANSETRON HCL 4 MG PO TABS: 4.0000 mg | ORAL_TABLET | Freq: Four times a day (QID) | ORAL | Status: DC | PRN

## 2013-03-26 MED ORDER — ALUM & MAG HYDROXIDE-SIMETH 200-200-20 MG/5ML PO SUSP: 30.0000 mL | ORAL | Status: DC | PRN

## 2013-03-26 MED ORDER — GLYCOPYRROLATE 0.2 MG/ML IJ SOLN
INTRAMUSCULAR | Status: DC | PRN
  Administered 2013-03-26: .7 mg via INTRAVENOUS

## 2013-03-26 MED ORDER — CEFAZOLIN SODIUM-DEXTROSE 2-3 GM-% IV SOLR
2.0000 g | Freq: Four times a day (QID) | INTRAVENOUS | Status: AC
  Administered 2013-03-26 – 2013-03-27 (×2): 2 g via INTRAVENOUS
  Filled 2013-03-26 (×2): qty 50

## 2013-03-26 MED ORDER — HYDROMORPHONE HCL PF 1 MG/ML IJ SOLN
INTRAMUSCULAR | Status: DC | PRN
  Administered 2013-03-26 (×2): 0.5 mg via INTRAVENOUS

## 2013-03-26 MED ORDER — FENTANYL CITRATE 0.05 MG/ML IJ SOLN
INTRAMUSCULAR | Status: DC | PRN
  Administered 2013-03-26: 50 ug via INTRAVENOUS
  Administered 2013-03-26: 100 ug via INTRAVENOUS
  Administered 2013-03-26: 50 ug via INTRAVENOUS
  Administered 2013-03-26: 100 ug via INTRAVENOUS
  Administered 2013-03-26 (×4): 50 ug via INTRAVENOUS

## 2013-03-26 MED ORDER — ASPIRIN EC 325 MG PO TBEC
325.0000 mg | DELAYED_RELEASE_TABLET | Freq: Two times a day (BID) | ORAL | Status: DC
  Administered 2013-03-27: 325 mg via ORAL
  Filled 2013-03-26 (×3): qty 1

## 2013-03-26 MED ORDER — SIMVASTATIN 20 MG PO TABS
20.0000 mg | ORAL_TABLET | Freq: Every day | ORAL | Status: DC
  Filled 2013-03-26 (×2): qty 1

## 2013-03-26 MED ORDER — CELECOXIB 200 MG PO CAPS
200.0000 mg | ORAL_CAPSULE | Freq: Two times a day (BID) | ORAL | Status: DC
  Administered 2013-03-26 – 2013-03-27 (×2): 200 mg via ORAL
  Filled 2013-03-26 (×3): qty 1

## 2013-03-26 MED ORDER — SODIUM CHLORIDE 0.9 % IR SOLN
Status: DC | PRN
  Administered 2013-03-26: 3000 mL

## 2013-03-26 MED ORDER — POLYETHYLENE GLYCOL 3350 17 G PO PACK: 17.0000 g | PACK | Freq: Every day | ORAL | Status: DC | PRN

## 2013-03-26 MED ORDER — LACTATED RINGERS IV SOLN
INTRAVENOUS | Status: DC | PRN
  Administered 2013-03-26 (×2): via INTRAVENOUS

## 2013-03-26 MED ORDER — MIDAZOLAM HCL 2 MG/2ML IJ SOLN
INTRAMUSCULAR | Status: AC
  Administered 2013-03-26: 2 mg
  Filled 2013-03-26: qty 2

## 2013-03-26 MED ORDER — FENTANYL CITRATE 0.05 MG/ML IJ SOLN
INTRAMUSCULAR | Status: AC
  Filled 2013-03-26: qty 2

## 2013-03-26 MED ORDER — METHOCARBAMOL 500 MG PO TABS
ORAL_TABLET | ORAL | Status: AC
  Filled 2013-03-26: qty 1

## 2013-03-26 MED ORDER — CEFUROXIME SODIUM 1.5 G IJ SOLR
INTRAMUSCULAR | Status: DC | PRN
  Administered 2013-03-26: 1.5 g

## 2013-03-26 MED ORDER — METHOCARBAMOL 500 MG PO TABS
500.0000 mg | ORAL_TABLET | Freq: Four times a day (QID) | ORAL | Status: DC | PRN
  Administered 2013-03-26 – 2013-03-27 (×2): 500 mg via ORAL
  Filled 2013-03-26: qty 1

## 2013-03-26 MED ORDER — TRAZODONE HCL 100 MG PO TABS
100.0000 mg | ORAL_TABLET | Freq: Every day | ORAL | Status: DC
  Filled 2013-03-26 (×2): qty 1

## 2013-03-26 MED ORDER — TOPIRAMATE 25 MG PO TABS
50.0000 mg | ORAL_TABLET | Freq: Two times a day (BID) | ORAL | Status: DC
  Filled 2013-03-26 (×3): qty 2

## 2013-03-26 MED ORDER — NEOSTIGMINE METHYLSULFATE 1 MG/ML IJ SOLN
INTRAMUSCULAR | Status: DC | PRN
  Administered 2013-03-26: 4 mg via INTRAVENOUS

## 2013-03-26 MED ORDER — BUPIVACAINE HCL (PF) 0.5 % IJ SOLN
INTRAMUSCULAR | Status: DC | PRN
  Administered 2013-03-26: 30 mL

## 2013-03-26 MED ORDER — DOCUSATE SODIUM 100 MG PO CAPS
100.0000 mg | ORAL_CAPSULE | Freq: Two times a day (BID) | ORAL | Status: DC
  Filled 2013-03-26 (×2): qty 1

## 2013-03-26 MED ORDER — SODIUM CHLORIDE 0.9 % IV SOLN
1000.0000 mg | INTRAVENOUS | Status: AC
  Administered 2013-03-26: 1000 mg via INTRAVENOUS
  Filled 2013-03-26: qty 10

## 2013-03-26 MED ORDER — OXYCODONE-ACETAMINOPHEN 5-325 MG PO TABS
1.0000 | ORAL_TABLET | ORAL | Status: DC | PRN
Start: 2013-03-26 — End: 2013-03-27
  Administered 2013-03-26 (×2): 2 via ORAL
  Administered 2013-03-27: 1 via ORAL
  Administered 2013-03-27 (×3): 2 via ORAL
  Filled 2013-03-26 (×5): qty 2

## 2013-03-26 MED ORDER — DEXAMETHASONE SODIUM PHOSPHATE 4 MG/ML IJ SOLN
INTRAMUSCULAR | Status: DC | PRN
  Administered 2013-03-26: 4 mg via INTRAVENOUS
  Administered 2013-03-26: 8 mg via INTRAVENOUS

## 2013-03-26 MED ORDER — BUPIVACAINE HCL (PF) 0.25 % IJ SOLN
INTRAMUSCULAR | Status: AC
  Filled 2013-03-26: qty 30

## 2013-03-26 MED ORDER — SODIUM CHLORIDE 0.9 % IR SOLN
Status: DC | PRN
  Administered 2013-03-26: 1000 mL

## 2013-03-26 MED ORDER — DEXAMETHASONE 6 MG PO TABS
10.0000 mg | ORAL_TABLET | Freq: Three times a day (TID) | ORAL | Status: DC
  Filled 2013-03-26 (×3): qty 1

## 2013-03-26 MED ORDER — ONDANSETRON HCL 4 MG/2ML IJ SOLN: 4.0000 mg | Freq: Four times a day (QID) | INTRAMUSCULAR | Status: DC | PRN

## 2013-03-26 MED ORDER — DEXAMETHASONE SODIUM PHOSPHATE 10 MG/ML IJ SOLN
10.0000 mg | Freq: Three times a day (TID) | INTRAMUSCULAR | Status: DC
  Administered 2013-03-26 – 2013-03-27 (×2): 10 mg via INTRAVENOUS
  Filled 2013-03-26 (×3): qty 1

## 2013-03-26 MED ORDER — SODIUM CHLORIDE 0.9 % IV SOLN
INTRAVENOUS | Status: DC
  Administered 2013-03-26 – 2013-03-27 (×2): via INTRAVENOUS

## 2013-03-26 MED ORDER — ZOLPIDEM TARTRATE 5 MG PO TABS: 5.0000 mg | ORAL_TABLET | Freq: Every evening | ORAL | Status: DC | PRN

## 2013-03-26 MED ORDER — PROMETHAZINE HCL 25 MG/ML IJ SOLN: 6.2500 mg | INTRAMUSCULAR | Status: DC | PRN

## 2013-03-26 MED ORDER — MIDAZOLAM HCL 5 MG/5ML IJ SOLN
INTRAMUSCULAR | Status: DC | PRN
  Administered 2013-03-26: 2 mg via INTRAVENOUS

## 2013-03-26 MED ORDER — MIDAZOLAM HCL 2 MG/2ML IJ SOLN
0.5000 mg | Freq: Once | INTRAMUSCULAR | Status: AC | PRN
  Administered 2013-03-26: 2 mg via INTRAVENOUS

## 2013-03-26 MED ORDER — LIDOCAINE HCL (CARDIAC) 20 MG/ML IV SOLN
INTRAVENOUS | Status: DC | PRN
  Administered 2013-03-26: 40 mg via INTRAVENOUS

## 2013-03-26 MED ORDER — MIDAZOLAM HCL 2 MG/2ML IJ SOLN
INTRAMUSCULAR | Status: AC
  Filled 2013-03-26: qty 2

## 2013-03-26 MED ORDER — PROPOFOL 10 MG/ML IV BOLUS
INTRAVENOUS | Status: DC | PRN
  Administered 2013-03-26: 200 mg via INTRAVENOUS

## 2013-03-26 MED ORDER — PROMETHAZINE HCL 25 MG/ML IJ SOLN: 12.5000 mg | Freq: Four times a day (QID) | INTRAMUSCULAR | Status: DC | PRN

## 2013-03-26 MED ORDER — KETOROLAC TROMETHAMINE 30 MG/ML IJ SOLN
INTRAMUSCULAR | Status: DC | PRN
  Administered 2013-03-26: 30 mg via INTRAVENOUS

## 2013-03-26 MED ORDER — DIPHENHYDRAMINE HCL 12.5 MG/5ML PO ELIX: 12.5000 mg | ORAL_SOLUTION | ORAL | Status: DC | PRN

## 2013-03-26 MED ORDER — LACTATED RINGERS IV SOLN
INTRAVENOUS | Status: DC
  Administered 2013-03-26: 11:00:00 via INTRAVENOUS

## 2013-03-26 MED ORDER — FENTANYL CITRATE 0.05 MG/ML IJ SOLN
INTRAMUSCULAR | Status: AC
  Administered 2013-03-26: 100 ug
  Filled 2013-03-26: qty 2

## 2013-03-26 MED ORDER — ROCURONIUM BROMIDE 100 MG/10ML IV SOLN
INTRAVENOUS | Status: DC | PRN
  Administered 2013-03-26: 40 mg via INTRAVENOUS

## 2013-03-26 MED ORDER — ONDANSETRON HCL 4 MG/2ML IJ SOLN
INTRAMUSCULAR | Status: DC | PRN
  Administered 2013-03-26: 4 mg via INTRAVENOUS

## 2013-03-26 MED ORDER — HYDROMORPHONE HCL PF 1 MG/ML IJ SOLN
1.0000 mg | INTRAMUSCULAR | Status: DC | PRN
  Administered 2013-03-26 – 2013-03-27 (×2): 1 mg via INTRAVENOUS
  Filled 2013-03-26 (×2): qty 1

## 2013-03-26 SURGICAL SUPPLY — 69 items
APL SKNCLS STERI-STRIP NONHPOA (GAUZE/BANDAGES/DRESSINGS) ×1
BANDAGE ESMARK 6X9 LF (GAUZE/BANDAGES/DRESSINGS) ×1 IMPLANT
BASEPLATE TIBIAL UNV SZ3 (Orthopedic Implant) ×1 IMPLANT
BENZOIN TINCTURE PRP APPL 2/3 (GAUZE/BANDAGES/DRESSINGS) ×2 IMPLANT
BLADE SAGITTAL 25.0X1.19X90 (BLADE) ×2 IMPLANT
BLADE SAW SGTL 13X75X1.27 (BLADE) ×2 IMPLANT
BNDG CMPR 9X6 STRL LF SNTH (GAUZE/BANDAGES/DRESSINGS) ×1
BNDG ESMARK 6X9 LF (GAUZE/BANDAGES/DRESSINGS) ×2
BOWL SMART MIX CTS (DISPOSABLE) ×2 IMPLANT
BSPLAT TIB 3 UNV CMNT TL STAB (Orthopedic Implant) ×1 IMPLANT
CEMENT HV SMART SET (Cement) ×1 IMPLANT
CLOTH BEACON ORANGE TIMEOUT ST (SAFETY) ×2 IMPLANT
CLSR STERI-STRIP ANTIMIC 1/2X4 (GAUZE/BANDAGES/DRESSINGS) ×1 IMPLANT
COVER BACK TABLE 24X17X13 BIG (DRAPES) IMPLANT
COVER SURGICAL LIGHT HANDLE (MISCELLANEOUS) ×2 IMPLANT
CUFF TOURNIQUET SINGLE 34IN LL (TOURNIQUET CUFF) ×1 IMPLANT
CUFF TOURNIQUET SINGLE 44IN (TOURNIQUET CUFF) IMPLANT
DRAPE EXTREMITY T 121X128X90 (DRAPE) ×2 IMPLANT
DRAPE U-SHAPE 47X51 STRL (DRAPES) ×2 IMPLANT
DRSG PAD ABDOMINAL 8X10 ST (GAUZE/BANDAGES/DRESSINGS) ×2 IMPLANT
DURAPREP 26ML APPLICATOR (WOUND CARE) ×3 IMPLANT
ELECT REM PT RETURN 9FT ADLT (ELECTROSURGICAL) ×2
ELECTRODE REM PT RTRN 9FT ADLT (ELECTROSURGICAL) ×1 IMPLANT
EVACUATOR 1/8 PVC DRAIN (DRAIN) ×2 IMPLANT
FACESHIELD LNG OPTICON STERILE (SAFETY) ×2 IMPLANT
GAUZE XEROFORM 5X9 LF (GAUZE/BANDAGES/DRESSINGS) ×2 IMPLANT
GLOVE BIOGEL PI IND STRL 8 (GLOVE) ×2 IMPLANT
GLOVE BIOGEL PI INDICATOR 8 (GLOVE) ×2
GLOVE ECLIPSE 7.5 STRL STRAW (GLOVE) ×4 IMPLANT
GOWN PREVENTION PLUS XLARGE (GOWN DISPOSABLE) ×2 IMPLANT
GOWN SRG XL XLNG 56XLVL 4 (GOWN DISPOSABLE) ×1 IMPLANT
GOWN STRL NON-REIN LRG LVL3 (GOWN DISPOSABLE) ×3 IMPLANT
GOWN STRL NON-REIN XL XLG LVL4 (GOWN DISPOSABLE) ×2
HANDPIECE INTERPULSE COAX TIP (DISPOSABLE) ×2
HOOD PEEL AWAY FACE SHEILD DIS (HOOD) ×5 IMPLANT
IMMOBILIZER KNEE 20 (SOFTGOODS)
IMMOBILIZER KNEE 20 THIGH 36 (SOFTGOODS) IMPLANT
IMMOBILIZER KNEE 22 UNIV (SOFTGOODS) ×1 IMPLANT
IMMOBILIZER KNEE 24 THIGH 36 (MISCELLANEOUS) IMPLANT
IMMOBILIZER KNEE 24 UNIV (MISCELLANEOUS)
INSERT TIBIAL BEARING KNEE (Knees) ×1 IMPLANT
KIT BASIN OR (CUSTOM PROCEDURE TRAY) ×2 IMPLANT
KIT ROOM TURNOVER OR (KITS) ×2 IMPLANT
MANIFOLD NEPTUNE II (INSTRUMENTS) ×2 IMPLANT
NDL HYPO 25GX1X1/2 BEV (NEEDLE) IMPLANT
NEEDLE HYPO 25GX1X1/2 BEV (NEEDLE) IMPLANT
NS IRRIG 1000ML POUR BTL (IV SOLUTION) ×2 IMPLANT
PACK TOTAL JOINT (CUSTOM PROCEDURE TRAY) ×2 IMPLANT
PAD ARMBOARD 7.5X6 YLW CONV (MISCELLANEOUS) ×4 IMPLANT
PAD CAST 4YDX4 CTTN HI CHSV (CAST SUPPLIES) ×1 IMPLANT
PADDING CAST COTTON 4X4 STRL (CAST SUPPLIES) ×2
PADDING CAST COTTON 6X4 STRL (CAST SUPPLIES) ×1 IMPLANT
SET HNDPC FAN SPRY TIP SCT (DISPOSABLE) ×1 IMPLANT
SPONGE GAUZE 4X4 12PLY (GAUZE/BANDAGES/DRESSINGS) ×2 IMPLANT
STAPLER VISISTAT 35W (STAPLE) IMPLANT
STEM CEMENTED TRIATHLON (Stem) ×1 IMPLANT
STRIP CLOSURE SKIN 1/2X4 (GAUZE/BANDAGES/DRESSINGS) ×2 IMPLANT
SUCTION FRAZIER TIP 10 FR DISP (SUCTIONS) ×2 IMPLANT
SUT MON AB 3-0 SH 27 (SUTURE) ×2
SUT MON AB 3-0 SH27 (SUTURE) IMPLANT
SUT VIC AB 0 CTB1 27 (SUTURE) ×4 IMPLANT
SUT VIC AB 1 CT1 27 (SUTURE) ×4
SUT VIC AB 1 CT1 27XBRD ANBCTR (SUTURE) ×2 IMPLANT
SUT VIC AB 2-0 CTB1 (SUTURE) ×5 IMPLANT
SYR CONTROL 10ML LL (SYRINGE) IMPLANT
TOWEL OR 17X24 6PK STRL BLUE (TOWEL DISPOSABLE) ×2 IMPLANT
TOWEL OR 17X26 10 PK STRL BLUE (TOWEL DISPOSABLE) ×2 IMPLANT
TUBE ANAEROBIC SPECIMEN COL (MISCELLANEOUS) ×2 IMPLANT
WATER STERILE IRR 1000ML POUR (IV SOLUTION) ×4 IMPLANT

## 2013-03-26 NOTE — Brief Op Note (Signed)
03/26/2013  2:13 PM  PATIENT:  [name]  57 y.o. female  PRE-OPERATIVE DIAGNOSIS:  painful left knee s/p total knee  POST-OPERATIVE DIAGNOSIS:  painful left knee s/p total knee  PROCEDURE:  Procedure(s): TOTAL KNEE REVISION- left (Left)  SURGEON:  Surgeon(s) and Role:    * Harvie Junior, MD - Primary  PHYSICIAN ASSISTANT:   ASSISTANTS: bethune   ANESTHESIA:   general  EBL:  Total I/O In: 1300 [I.V.:1300] Out: 50 [Urine:50]  BLOOD ADMINISTERED:none  DRAINS: none and (1) Hemovact drain(s) in the l knee with  Suction Open   LOCAL MEDICATIONS USED:  NONE  SPECIMEN:  No Specimen  DISPOSITION OF SPECIMEN:  N/A  COUNTS:  YES  TOURNIQUET:   Total Tourniquet Time Documented: Thigh (Left) - 76 minutes Total: Thigh (Left) - 76 minutes   DICTATION: .Other Dictation: Dictation Number [id number]  PLAN OF CARE: Admit to inpatient   PATIENT DISPOSITION:  PACU - hemodynamically stable.   Delay start of Pharmacological VTE agent (>24hrs) due to surgical blood loss or risk of bleeding: no

## 2013-03-26 NOTE — H&P (Signed)
TOTAL KNEE REVISION ADMISSION H&P  Patient is being admitted for left revision total knee arthroplasty.  Subjective:  Chief Complaint:left knee pain.  HPI: [name], 57 y.o. female, has a history of pain and functional disability in the left knee(s) due to failed previous arthroplasty and patient has failed non-surgical conservative treatments for greater than 12 weeks to include NSAID's and/or analgesics, corticosteriod injections and activity modification. The indications for the revision of the total knee arthroplasty are chronic pain with persistent effusion. Onset of symptoms was gradual starting 1 years ago with gradually worsening course since that time.  Prior procedures on the left knee(s) include arthroplasty.  Patient currently rates pain in the left knee(s) at 8 out of 10 with activity. There is night pain, worsening of pain with activity and weight bearing, pain that interferes with activities of daily living, crepitus and joint swelling.  Patient has evidence of well cemented prosthesis by imaging studies. This condition presents safety issues increasing the risk of falls. This patient has had failure of prolonged conservative care.  There is no current active infection.she understands well that sometimes total knees hurt for reasons that we cannot explain and she may have similar pain after this revision.  Patient Active Problem List   Diagnosis Date Noted  . Muscle weakness (generalized) 07/02/2012  . Total knee replacement status 07/02/2012  . Difficulty in walking 07/02/2012  . Knee stiffness 07/02/2012  . Knee pain 07/02/2012   Past Medical History  Diagnosis Date  . Hyperlipidemia     takes Simvastatin nightly  . Seasonal allergies     in spring  . History of migraine     last one was on 06/02/12;takes Topamax bid and Imitrex prn  . Arthritis   . Joint pain   . Joint swelling   . Insomnia     takes Trazodone nightly  . VHQIONGE(952.8)     Past Surgical  History  Procedure Laterality Date  . Cyst removed from right knee   1972  . Left knee surgery  1973  . Abdominal hysterectomy  1978  . Right knee arthroscopy  90's     x 3  . Right knee partial replacement  1991  . Bladder tacked  1999  . Breast surgery  2009    cyst removed from left breast  . Right wrist sugery  2008  . Scar tissue removed  2008  . Appendectomy  2008  . Right wrist surgery    . Left knee surgery  2012  . Left knee surgery  2012  . Right carpal tunnel    . Colonoscopy    . Artroplasty    . Total knee arthroplasty  06/12/2012    LEFT KNEE  . Total knee arthroplasty  06/12/2012    Procedure: TOTAL KNEE ARTHROPLASTY;  Surgeon: Loreta Ave, MD;  Location: [facility] OR;  Service: Orthopedics;  Laterality: Left;    Prescriptions prior to admission  Medication Sig Dispense Refill  . simvastatin (ZOCOR) 20 MG tablet Take 20 mg by mouth at bedtime.       . SUMAtriptan (IMITREX) 100 MG tablet Take 100 mg by mouth every 2 (two) hours as needed. For migraines      . topiramate (TOPAMAX) 50 MG tablet Take 50 mg by mouth 2 (two) times daily.      . traMADol (ULTRAM) 50 MG tablet Take 50 mg by mouth every 6 (six) hours as needed for pain. For pain      .  traZODone (DESYREL) 50 MG tablet Take 100 mg by mouth at bedtime.       Allergies  Allergen Reactions  . Mupirocin Other (See Comments)    Bumps on tongue and thickness to tongue     History  Substance Use Topics  . Smoking status: Former Smoker    Quit date: 06/12/2000  . Smokeless tobacco: Never Used     Comment: quit 10+yrs ago  . Alcohol Use: No    No family history on file.    ROS  ROS: I have reviewed the patient's review of systems thoroughly and there are no positive responses as relates to the HPI. Objective:  Physical Exam  Vital signs in last 24 hours: Temp:  [97.4 F (36.3 C)] 97.4 F (36.3 C) (09/24 1035) Pulse Rate:  [78] 78 (09/24 1035) Resp:  [20] 20 (09/24 1035) BP: (120)/(57) 120/57 mmHg  (09/24 1035) SpO2:  [100 %] 100 % (09/24 1035) Well-developed well-nourished patient in no acute distress. Alert and oriented x3 HEENT:within normal limits Cardiac: Regular rate and rhythm Pulmonary: Lungs clear to auscultation Abdomen: Soft and nontender.  Normal active bowel sounds  Musculoskeletal: l knee painful over lateral side// + 1+ effusion mild flexion instability Labs: Recent Results (from the past 2160 hour(s))  URINALYSIS, ROUTINE W REFLEX MICROSCOPIC     Status: Abnormal   Collection Time    03/21/13  2:55 PM      Result Value Range   Color, Urine YELLOW  YELLOW   APPearance CLEAR  CLEAR   Specific Gravity, Urine 1.018  1.005 - 1.030   pH 6.0  5.0 - 8.0   Glucose, UA NEGATIVE  NEGATIVE mg/dL   Hgb urine dipstick SMALL (*) NEGATIVE   Bilirubin Urine NEGATIVE  NEGATIVE   Ketones, ur 15 (*) NEGATIVE mg/dL   Protein, ur NEGATIVE  NEGATIVE mg/dL   Urobilinogen, UA 0.2  0.0 - 1.0 mg/dL   Nitrite NEGATIVE  NEGATIVE   Leukocytes, UA MODERATE (*) NEGATIVE  URINE MICROSCOPIC-ADD ON     Status: Abnormal   Collection Time    03/21/13  2:55 PM      Result Value Range   Squamous Epithelial / LPF RARE  RARE   WBC, UA 3-6  <3 WBC/hpf   RBC / HPF 3-6  <3 RBC/hpf   Bacteria, UA FEW (*) RARE   Urine-Other MUCOUS PRESENT    URINE CULTURE     Status: None   Collection Time    03/21/13  2:55 PM      Result Value Range   Specimen Description URINE, RANDOM     Special Requests NONE     Culture  Setup Time       Value: 03/21/2013 17:06     Performed at [facility] Count       Value: 30,000 COLONIES/ML     Performed at [facility]   Culture       Value: Multiple bacterial morphotypes present, none predominant. Suggest appropriate recollection if clinically indicated.     Performed at [facility]   Report Status 03/22/2013 FINAL    APTT     Status: None   Collection Time    03/21/13  2:56 PM      Result Value Range   aPTT 29  24 - 37  seconds  CBC WITH DIFFERENTIAL     Status: None   Collection Time    03/21/13  2:56 PM  Result Value Range   WBC 6.0  4.0 - 10.5 K/uL   RBC 5.01  3.87 - 5.11 MIL/uL   Hemoglobin 14.5  12.0 - 15.0 g/dL   HCT 40.9  81.1 - 91.4 %   MCV 85.0  78.0 - 100.0 fL   MCH 28.9  26.0 - 34.0 pg   MCHC 34.0  30.0 - 36.0 g/dL   RDW 78.2  95.6 - 21.3 %   Platelets 343  150 - 400 K/uL   Neutrophils Relative % 65  43 - 77 %   Neutro Abs 3.9  1.7 - 7.7 K/uL   Lymphocytes Relative 25  12 - 46 %   Lymphs Abs 1.5  0.7 - 4.0 K/uL   Monocytes Relative 8  3 - 12 %   Monocytes Absolute 0.5  0.1 - 1.0 K/uL   Eosinophils Relative 1  0 - 5 %   Eosinophils Absolute 0.1  0.0 - 0.7 K/uL   Basophils Relative 1  0 - 1 %   Basophils Absolute 0.0  0.0 - 0.1 K/uL  COMPREHENSIVE METABOLIC PANEL     Status: Abnormal   Collection Time    03/21/13  2:56 PM      Result Value Range   Sodium 137  135 - 145 mEq/L   Potassium 3.2 (*) 3.5 - 5.1 mEq/L   Chloride 101  96 - 112 mEq/L   CO2 23  19 - 32 mEq/L   Glucose, Bld 85  70 - 99 mg/dL   BUN 12  6 - 23 mg/dL   Creatinine, Ser 0.86  0.50 - 1.10 mg/dL   Calcium 9.7  8.4 - 57.8 mg/dL   Total Protein 7.4  6.0 - 8.3 g/dL   Albumin 4.3  3.5 - 5.2 g/dL   AST 16  0 - 37 U/L   ALT 7  0 - 35 U/L   Alkaline Phosphatase 75  39 - 117 U/L   Total Bilirubin 0.3  0.3 - 1.2 mg/dL   GFR calc non Af Amer >90  >90 mL/min   GFR calc Af Amer >90  >90 mL/min   Comment: (NOTE)     The eGFR has been calculated using the CKD EPI equation.     This calculation has not been validated in all clinical situations.     eGFR's persistently <90 mL/min signify possible Chronic Kidney     Disease.  PROTIME-INR     Status: None   Collection Time    03/21/13  2:56 PM      Result Value Range   Prothrombin Time 12.6  11.6 - 15.2 seconds   INR 0.96  0.00 - 1.49  TYPE AND SCREEN     Status: None   Collection Time    03/21/13  3:05 PM      Result Value Range   ABO/RH(D) O POS     Antibody  Screen NEG     Sample Expiration 04/04/2013     Estimated body mass index is 25.79 kg/(m^2) as calculated from the following:   Height as of 03/21/13: 5\' 6"  (1.676 m).   Weight as of 06/06/12: 72.439 kg (159 lb 11.2 oz).  Imaging Review Plain radiographs demonstrate mild degenerative joint disease of the left knee(s). The overall alignment is neutral.There is evidence of loosening of the none of the components. The bone quality appears to be fair for age and reported activity level.   Assessment/Plan:  End stage arthritis, left knee(s) with failed  previous arthroplasty.   The patient history, physical examination, clinical judgment of the provider and imaging studies are consistent with end stage degenerative joint disease of the left knee(s), previous total knee arthroplasty. Revision total knee arthroplasty is deemed medically necessary. The treatment options including medical management, injection therapy, arthroscopy and revision arthroplasty were discussed at length. The risks and benefits of revision total knee arthroplasty were presented and reviewed. The risks due to aseptic loosening, infection, stiffness, patella tracking problems, thromboembolic complications and other imponderables were discussed. The patient acknowledged the explanation, agreed to proceed with the plan and consent was signed. Patient is being admitted for inpatient treatment for surgery, pain control, PT, OT, prophylactic antibiotics, VTE prophylaxis, progressive ambulation and ADL's and discharge planning.The patient is planning to be discharged home with home health services

## 2013-03-26 NOTE — Anesthesia Postprocedure Evaluation (Signed)
Anesthesia Post Note  Patient: [name]  Procedure(s) Performed: Procedure(s) (LRB): TOTAL KNEE REVISION- left (Left)  Anesthesia type: GA  Patient location: PACU  Post pain: Pain level controlled  Post assessment: Post-op Vital signs reviewed  Last Vitals:  Filed Vitals:   03/26/13 1430  BP: 103/50  Pulse: 78  Temp: 36.5 C  Resp: 16    Post vital signs: Reviewed  Level of consciousness: sedated  Complications: No apparent anesthesia complications

## 2013-03-26 NOTE — Progress Notes (Signed)
Orthopedic Tech Progress Note Patient Details:  [name] 1955-10-25 [id number]  CPM Left Knee CPM Left Knee: On Left Knee Flexion (Degrees): 60 Left Knee Extension (Degrees): 0 Additional Comments: Trapeze bar   Cammer, Mickie Bail 03/26/2013, 3:29 PM

## 2013-03-26 NOTE — Transfer of Care (Signed)
Immediate Anesthesia Transfer of Care Note  Patient: [name]  Procedure(s) Performed: Procedure(s): TOTAL KNEE REVISION- left (Left)  Patient Location: PACU  Anesthesia Type:General and Regional  Level of Consciousness: patient cooperative and responds to stimulation  Airway & Oxygen Therapy: Patient Spontanous Breathing and Patient connected to nasal cannula oxygen  Post-op Assessment: Report given to PACU RN and Post -op Vital signs reviewed and stable  Post vital signs: Reviewed and stable  Complications: No apparent anesthesia complications

## 2013-03-26 NOTE — Anesthesia Procedure Notes (Signed)
Anesthesia Regional Block:  Supraclavicular block  Pre-Anesthetic Checklist: ,, timeout performed, Correct Patient, Correct Site, Correct Laterality, Correct Procedure, Correct Position, site marked, Risks and benefits discussed,  Surgical consent,  Pre-op evaluation,  At surgeon's request and post-op pain management  Laterality: Left  Prep: chloraprep       Needles:  Injection technique: Single-shot  Needle Type: Stimiplex          Additional Needles:  Procedures: ultrasound guided (picture in chart) Supraclavicular block Narrative:  Start time: 03/26/2013 11:11 AM End time: 03/26/2013 11:27 AM Injection made incrementally with aspirations every 5 mL.  Performed by: Personally   Additional Notes: Risks, benefits and alternative to block explained extensively.  Patient tolerated procedure well, without complications.  Supraclavicular block

## 2013-03-26 NOTE — Progress Notes (Signed)
Report given to jamie rn as caregiver 

## 2013-03-26 NOTE — Anesthesia Preprocedure Evaluation (Signed)
Anesthesia Evaluation  Patient identified by MRN, date of birth, ID band Patient awake    Reviewed: Allergy & Precautions, H&P , NPO status , Patient's Chart, lab work & pertinent test results  History of Anesthesia Complications (+) PONV  Airway       Dental   Pulmonary          Cardiovascular     Neuro/Psych  Headaches,    GI/Hepatic   Endo/Other    Renal/GU      Musculoskeletal   Abdominal   Peds  Hematology   Anesthesia Other Findings   Reproductive/Obstetrics                           Anesthesia Physical Anesthesia Plan  ASA: II  Anesthesia Plan: General and Regional   Post-op Pain Management: MAC Combined w/ Regional for Post-op pain   Induction: Intravenous  Airway Management Planned: Oral ETT  Additional Equipment:   Intra-op Plan:   Post-operative Plan: Extubation in OR  Informed Consent: I have reviewed the patients History and Physical, chart, labs and discussed the procedure including the risks, benefits and alternatives for the proposed anesthesia with the patient or authorized representative who has indicated his/her understanding and acceptance.   Dental advisory given  Plan Discussed with: Anesthesiologist and Surgeon  Anesthesia Plan Comments:         Anesthesia Quick Evaluation

## 2013-03-27 ENCOUNTER — Encounter (HOSPITAL_COMMUNITY): Payer: Self-pay | Admitting: Orthopedic Surgery

## 2013-03-27 DIAGNOSIS — T8484XA Pain due to internal orthopedic prosthetic devices, implants and grafts, initial encounter: Secondary | ICD-10-CM

## 2013-03-27 DIAGNOSIS — Z96652 Presence of left artificial knee joint: Secondary | ICD-10-CM

## 2013-03-27 LAB — CBC
HCT: 37.2 % (ref 36.0–46.0)
Hemoglobin: 12.2 g/dL (ref 12.0–15.0)
MCHC: 32.8 g/dL (ref 30.0–36.0)
Platelets: 296 10*3/uL (ref 150–400)
RBC: 4.38 MIL/uL (ref 3.87–5.11)
WBC: 11 10*3/uL — ABNORMAL HIGH (ref 4.0–10.5)

## 2013-03-27 LAB — BASIC METABOLIC PANEL
BUN: 15 mg/dL (ref 6–23)
CO2: 23 mEq/L (ref 19–32)
Chloride: 103 mEq/L (ref 96–112)
GFR calc non Af Amer: >90 mL/min (ref >90)
Glucose, Bld: 163 mg/dL — ABNORMAL HIGH (ref 70–99)
Potassium: 3.7 mEq/L (ref 3.5–5.1)
Sodium: 135 mEq/L (ref 135–145)

## 2013-03-27 MED ORDER — OXYCODONE-ACETAMINOPHEN 5-325 MG PO TABS: 1.0000 | ORAL_TABLET | Freq: Four times a day (QID) | ORAL | Status: DC | PRN

## 2013-03-27 MED ORDER — ASPIRIN EC 325 MG PO TBEC: 325.0000 mg | DELAYED_RELEASE_TABLET | Freq: Two times a day (BID) | ORAL | Status: DC

## 2013-03-27 MED ORDER — METHOCARBAMOL 750 MG PO TABS: 750.0000 mg | ORAL_TABLET | Freq: Three times a day (TID) | ORAL | Status: DC

## 2013-03-27 NOTE — Discharge Summary (Signed)
Patient ID: [name] MRN: [id number] DOB/AGE: [birth year] 57 y.o.  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 Hyperlipidemia     takes Simvastatin nightly  . Seasonal allergies     in spring  . History of migraine     last one was on 06/02/12;takes Topamax bid and Imitrex prn  . Arthritis   . Joint pain   . Joint swelling   . Insomnia     takes Trazodone nightly  . Headache(784.0)     Surgeries: Procedure(s): TOTAL KNEE REVISION- left on 03/26/2013    Discharged Condition: Improved  Hospital Course: [name] is an 57 y.o. female who was admitted 03/26/2013 for operative treatment ofPain due to total left knee replacement.Not improved with conservative management.  Plain radiographs showed no radiographic evidence of loosening of her prosthesis.  The tibial component did have some overhang on the lateral aspect.  Knee aspiration was negative for infection. Patient has severe unremitting pain that affects sleep, daily activities, and work/hobbies. After pre-op clearance the patient was taken to the operating room on 03/26/2013 and underwent  Procedure(s): TOTAL KNEE REVISION- left.    Patient was given perioperative antibiotics: Anti-infectives   Start     Dose/Rate Route Frequency Ordered Stop   03/26/13 2000  ceFAZolin (ANCEF) IVPB 2 g/50 mL premix     2 g 100 mL/hr over 30 Minutes Intravenous Every 6 hours 03/26/13 1956 03/27/13 0156   03/26/13 1328  cefUROXime (ZINACEF) injection  Status:  Discontinued       As needed 03/26/13 1328 03/26/13 1433   03/26/13 0600  ceFAZolin (ANCEF) IVPB 2 g/50 mL premix     2 g 100 mL/hr over 30 Minutes Intravenous On call to O.R. 03/25/13 1354 03/26/13 1219       Patient was given sequential compression devices, early ambulation, and chemoprophylaxis to prevent  DVT.  Patient benefited maximally from hospital stay and there were no complications.    Recent vital signs: Patient Vitals for the past 24 hrs:  BP Temp Temp src Pulse Resp SpO2  03/27/13 0700 135/68 mmHg 98.4 F (36.9 C) Oral 68 18 99 %  03/27/13 0212 121/71 mmHg 98.1 F (36.7 C) Oral 78 18 100 %  03/26/13 2014 102/53 mmHg 97.7 F (36.5 C) Oral 86 18 100 %  03/26/13 1930 92/54 mmHg 98.2 F (36.8 C) - 68 10 100 %  03/26/13 1830 90/55 mmHg - - 82 10 100 %  03/26/13 1630 115/52 mmHg - - 91 16 100 %  03/26/13 1615 102/55 mmHg - - 60 12 100 %  03/26/13 1600 108/75 mmHg - - 69 13 100 %  03/26/13 1545 107/72 mmHg - - 55 16 100 %  03/26/13 1530 112/66 mmHg - - 80 13 100 %  03/26/13 1515 104/56 mmHg - - 95 15 100 %  03/26/13 1500 106/54 mmHg - - 90 14 100 %  03/26/13 1445 105/51 mmHg - - 66 13 96 %  03/26/13 1430 103/50 mmHg 97.7 F (36.5 C) - 78 16 100 %     Recent laboratory studies:  Recent Labs  03/27/13 0910  WBC 11.0*  HGB 12.2  HCT 37.2  PLT 296  NA 135  K 3.7  CL 103  CO2 23  BUN 15  CREATININE 0.67  GLUCOSE 163*  CALCIUM 8.6     Discharge Medications:     Medication List         aspirin EC 325 MG tablet  Take 1 tablet (325 mg total) by mouth 2 (two) times daily after a meal.     methocarbamol 750 MG tablet  Commonly known as:  ROBAXIN-750  Take 1 tablet (750 mg total) by mouth 3 (three) times daily. Prn spasm.     oxyCODONE-acetaminophen 5-325 MG per tablet  Commonly known as:  PERCOCET/ROXICET  Take 1-2 tablets by mouth every 6 (six) hours as needed for pain.     simvastatin 20 MG tablet  Commonly known as:  ZOCOR  Take 20 mg by mouth at bedtime.     SUMAtriptan 100 MG tablet  Commonly known as:  IMITREX  Take 100 mg by mouth every 2 (two) hours as needed. For migraines     topiramate 50 MG tablet  Commonly known as:  TOPAMAX  Take 50 mg by mouth 2 (two) times daily.     traMADol 50 MG tablet  Commonly known as:  ULTRAM  Take 50 mg by  mouth every 6 (six) hours as needed for pain. For pain     traZODone 50 MG tablet  Commonly known as:  DESYREL  Take 100 mg by mouth at bedtime.        Diagnostic Studies: Dg Chest 2 View  03/21/2013   CLINICAL DATA:  Preop for knee replacement  EXAM: CHEST  2 VIEW  COMPARISON:  03/11/2012  FINDINGS: The heart size and mediastinal contours are within normal limits. Both lungs are clear. The visualized skeletal structures are stable.  IMPRESSION: No active cardiopulmonary disease.   Electronically Signed   By: Natasha Mead   On: 03/21/2013 15:21    Disposition: 06-Home-Health Care Svc      Discharge Orders   Future Orders Complete By Expires   Constipation Prevention  As directed    Comments:     Drink plenty of fluids.  Prune juice may be helpful.  You may use a stool softener, such as Colace (over the counter) 100 mg twice a day.  Use MiraLax (over the counter) for constipation as needed.   CPM  As directed    Comments:     Continuous passive motion machine (CPM):      Use the CPM from 0 degrees to 70 degrees for 6-8 hours per day.      You may increase by 10 degrees per day.  You may break it up into 2 or 3 sessions per day.      Use CPM for 1-2 weeks or until you are told to stop.   Diet general  As directed    Do not put a pillow under the knee. Place it under the heel.  As directed    Increase activity slowly as tolerated  As directed    TED hose  As directed    Comments:     Use stockings (TED hose) for 1-2 weeks on both leg(s).  You may remove them at night for sleeping.   Weight bearing as tolerated  As directed       Follow-up Information   Follow up with GRAVES,JOHN L, MD. Schedule an appointment as soon as possible for a visit in 2 weeks.   Specialty:  Orthopedic Surgery   Contact information:   [address] [place] [address] [phone]       Follow up with [facility].   Contact information:   [address] [place]  [address] [phone]        Signed: Matthew Folks 03/27/2013, 12:50 PM

## 2013-03-27 NOTE — Progress Notes (Signed)
UR COMPLETED  

## 2013-03-27 NOTE — Op Note (Signed)
NAMEBERDA, SHELVIN              ACCOUNT NO.:  [id number]  MEDICAL RECORD NO.:  [id number]  LOCATION:  5N04C                        FACILITY:  [facility]  PHYSICIAN:  Harvie Junior, M.D.   DATE OF BIRTH:  1956/01/20  DATE OF PROCEDURE:  03/26/2013 DATE OF DISCHARGE:                              OPERATIVE REPORT   PREOPERATIVE DIAGNOSIS:  Painful left total knee 9 months status post total knee replacement.  POSTOPERATIVE DIAGNOSIS:  Painful left total knee 9 months status post total knee replacement.  PROCEDURE:  Left total knee revision with removal of the tibial component and swapping out the polyethylene insert.  SURGEON:  Harvie Junior, M.D.  ASSISTANT:  Marshia Ly, P.A.  ANESTHESIA:  General.  BRIEF HISTORY:  Ms. Gates is a 57 year old female with a history of having had total knee replacement done about 9 months ago.  She came to me about 4 months into the postoperative process still having significant complaints of left knee pain.  We treated her conservatively for 5 months and after failure of conservative care, underwent an infection workup, which was negative.  We had a long discussion with her about treatment options, but felt that a synovectomy attempted revision might make sense.  Preoperative x-ray to me, there was some question about possible lateral overhang of the tibial and we felt that tibia revision would be appropriate and I thought she might have a little bit of laxity in the knee and we felt that removing some of this might be helpful as well.  She was brought to the operating room for evaluation and this procedure with the hope that we could eliminate some of her pain.  PROCEDURE:  The patient was taken to the operating room.  After adequate anesthesia was achieved with general anesthetic, the patient was placed supine on the operating table.  The left leg was examined under anesthesia.  She was noted to have some laxity in extension and  also greater degree of laxity in flexion.  After this, the left leg was prepped and draped in usual sterile fashion.  Following this, the leg was exsanguinated.  Blood pressure tourniquet inflated to 350 mmHg. Following this, an incision was made through the old incision in the subcutaneous tissue down the level of the extensor mechanism and a medial parapatellar arthrotomy was undertaken.  Once this was done, an extensive synovectomy was undertaken and the knee was exposed.  We took out the size 9 poly that the patient had in place, trialed up to an 11 at that point that easily fit in and no tendency towards flexion contracture at that point, stability felt a little better.  At that point, we dissected around the lateral side and it did appear as though there was a little bit of a lateral component that was overhanging and we felt that given that we had brought her to the operating room for this that a revision needs to be undertaken.  At this point, a saw was used to loosen the tibial component and then thin osteotomes all the way around.  Once this was done, an intramedullary guide was used, and we then cut the tibia with an intramedullary guide,  and this clean-up cut did look to be intact with tiny bit of flexion out of the cut and took about 3 mm of bone from the very front surface.  Once a clean-up cut was done, cement from the chamfers were taken out and once that was done, the attention was turned towards placing the new component.  We drilled and keeled for this 50 mm stem that we felt we would cement in place and once that was done, the trial was undertaken, went up to a size 19 poly, which gave excellent stability, went to a 22 and that was way too tight in flexion and extension.  The 19 was really the right fit and at this point we contemplated the idea of putting in a 10-mm buildup, but felt like if we did that then we could potentially be a little too tight as we were just  at 19, so we felt that we really needed to take that as the appropriate course of action.  This was placed.  The patient then had the leg copiously and thoroughly lavaged with pulsatile lavage irrigation and suctioned dry.  The final component was cemented in place size 3 with a 50 mm stem.  Once that was cemented in place, then all bone cement was allowed to harden.  Then attention was turned towards again re-trialing.  At that point, I re-trialed with a 19, felt perfect and a final 19 poly was opened and placed.  Excellent stability was achieved at this point and range of motion.  I looked at the femur, it was well fixed, looked at the patella, it was well fixed.  I did an extensive synovectomy.  Tourniquet was let down.  At this point, all bleeding was controlled with electrocautery.  The medium Hemovac drain was placed.  The medial parapatellar arthrotomy was closed with 1 Vicryl running, skin with 0 and 2-0 Vicryl and 3-0 Monocryl subcuticular. Benzoin and Steri-Strips were applied.  Sterile compressive dressing was applied.  The patient was taken to the recovery room and she was noted to be in a satisfactory condition.  Estimated blood loss for the procedure was minimal.     Harvie Junior, M.D.     Ranae Plumber  D:  03/26/2013  T:  03/27/2013  Job:  [id number]

## 2013-03-27 NOTE — Evaluation (Signed)
Physical Therapy Evaluation Patient Details Name: [name] MRN: [id number] DOB: January 18, 1956 Today's Date: 03/27/2013 Time: 0907-1000 PT Time Calculation (min): 53 min  PT Assessment / Plan / Recommendation History of Present Illness  Pt. underwent revision of L TKA due to pain /failure of TKA done 9 months ago  Clinical Impression  This patient underwent a left TKA revisoin  and presents to PT with anticipated post-op decrease in strength and ROM, decreased functional mobility and gait.  Pt. Will benefit from acute PT to address these and below issues.  Overall, pt is at a supervision level with mobility and has 105 degrees of knee flexion this am.  She appears to have some lingering effect from nerve block with numbness and quad weakness.  She desires to Dc home today and it appears that she is safe for DC at supervision level and will have 24 hour assist at home. She needs HHPT to continue along with her.  Will sign off with no goals established due to anticipated DC (She says Dr. Luiz Blare told her she could go if she did well with PT).    PT Assessment  All further PT needs can be met in the next venue of care    Follow Up Recommendations  Home health PT;Supervision/Assistance - 24 hour;Supervision for mobility/OOB    Does the patient have the potential to tolerate intense rehabilitation      Barriers to Discharge        Equipment Recommendations  None recommended by PT    Recommendations for Other Services     Frequency      Precautions / Restrictions Precautions Precautions: Knee Precaution Booklet Issued: Yes (comment) Precaution Comments: provided knee exercise sheet and precautions and reviewed Required Braces or Orthoses: Knee Immobilizer - Left Restrictions Weight Bearing Restrictions: Yes LLE Weight Bearing: Weight bearing as tolerated   Pertinent Vitals/Pain See vitals tab       Mobility  Bed Mobility Bed Mobility: Not assessed (pt. up in  recliner) Transfers Transfers: Sit to Stand;Stand to Sit Sit to Stand: 6: Modified independent (Device/Increase time);From chair/3-in-1;With upper extremity assist;With armrests Stand to Sit: 6: Modified independent (Device/Increase time);With upper extremity assist;With armrests;To chair/3-in-1 Details for Transfer Assistance: pt. at [facility] with transitions and demonstrates good and safe technique Ambulation/Gait Ambulation/Gait Assistance: 6: Modified independent (Device/Increase time) Ambulation Distance (Feet): 200 Feet Assistive device: Rolling walker Ambulation/Gait Assistance Details: Needs cues to stand erect, otherwise good technique and no overt LOB Gait Pattern: Step-through pattern;Trunk flexed Stairs: Yes Stairs Assistance: 5: Supervision Stairs Assistance Details (indicate cue type and reason): initial instruction for technique and sequence than pt. at supervision level Stair Management Technique: Two rails;Step to pattern;Forwards Number of Stairs: 5    Exercises Total Joint Exercises Ankle Circles/Pumps: AROM;Both;10 reps;Seated Quad Sets: AROM;Left;10 reps;Seated;Other (comment) (weak contraction) Short Arc Quad: PROM;Left;10 reps;Seated Heel Slides: AROM;10 reps;Seated Hip ABduction/ADduction: AROM;Left;10 reps;Seated (reclined in chair) Straight Leg Raises: Other (comment) (unable) Long Arc Quad: Other (comment) (unable) Goniometric ROM: 0-105   PT Diagnosis: Difficulty walking;Acute pain  PT Problem List: Decreased strength;Decreased range of motion;Decreased activity tolerance;Decreased mobility;Pain PT Treatment Interventions:       PT Goals(Current goals can be found in the care plan section)    Visit Information  Last PT Received On: 03/27/13 Assistance Needed: +1 History of Present Illness: Pt. 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 2013 Prior Function Level of Independence: Independent with assistive device(s) (used straight cane) Communication Communication: No difficulties    Cognition  Cognition Arousal/Alertness: Awake/alert Behavior During Therapy: WFL for tasks assessed/performed Overall Cognitive Status: Within Functional Limits for tasks assessed    Extremity/Trunk Assessment Upper Extremity Assessment Upper Extremity Assessment: Overall WFL for tasks assessed Lower Extremity Assessment Lower Extremity Assessment: LLE deficits/detail LLE Deficits / Details: good ankle pump; weak quad set; unable to SAQ or SLR LLE Sensation: decreased light touch (still feels numb top of thigh)   Balance Balance Balance Assessed: Yes Dynamic Standing Balance Dynamic Standing - Balance Support: No upper extremity supported (at sink washing hands) Dynamic Standing - Level of Assistance: 6: Modified independent (Device/Increase time)  End of Session PT - End of Session Equipment Utilized During Treatment: Gait belt;Left knee immobilizer Activity Tolerance: Patient tolerated treatment well Patient left: in chair;with call bell/phone within reach Nurse Communication: Mobility status;Patient requests pain meds;Precautions;Weight bearing status;Other (comment) (pt.'s progress and DC readiness) CPM Left Knee CPM Left Knee: Off  GP     [name] 03/27/2013, 10:17 AM [name] PT Acute Rehab Services [phone] [place] [phone]

## 2013-03-27 NOTE — Progress Notes (Signed)
Subjective: 1 Day Post-Op Procedure(s) (LRB): TOTAL KNEE REVISION- left (Left) Patient reports pain as 3 on 0-10 scale.   catheter out this morning Objective: Vital signs in last 24 hours: Temp:  [97.4 F (36.3 C)-98.4 F (36.9 C)] 98.4 F (36.9 C) (09/25 0700) Pulse Rate:  [55-101] 68 (09/25 0700) Resp:  [9-25] 18 (09/25 0700) BP: (90-137)/(48-86) 135/68 mmHg (09/25 0700) SpO2:  [96 %-100 %] 99 % (09/25 0700)  Intake/Output from previous day: 09/24 0701 - 09/25 0700 In: 2326.7 [I.V.:2326.7] Out: 1270 [Urine:1050; Drains:220] Intake/Output this shift:     Recent Labs  03/27/13 0910  HGB 12.2    Recent Labs  03/27/13 0910  WBC 11.0*  RBC 4.38  HCT 37.2  PLT 296    Recent Labs  03/27/13 0910  NA 135  K 3.7  CL 103  CO2 23  BUN 15  CREATININE 0.67  GLUCOSE 163*  CALCIUM 8.6   No results found for this basename: LABPT, INR,  in the last 72 hours  Left knee exam: Dressing clean and dry.  Hemovac is intact.  Calf soft and nontender.  Range of motion 0 to 90 of flexion.  Able to do a straight leg raise.  NV intact distally  Assessment/Plan: 1 Day Post-Op Procedure(s) (LRB): TOTAL KNEE REVISION- left (Left) Plan: Dressing changed by nursing.  Drain pulled. If she does well with physical therapy.  She can be discharged home this afternoon or wait until tomorrow. If she passes PT she can be discharged later today. Prescriptions written.  She will need home health physical therapy. Enteric-coated aspirin 325 mg twice daily x1 month for DVT prophylaxis.   Jolina Symonds G 03/27/2013, 10:27 AM

## 2013-03-29 LAB — BODY FLUID CULTURE: Culture: NO GROWTH

## 2013-03-31 LAB — ANAEROBIC CULTURE

## 2013-04-15 ENCOUNTER — Ambulatory Visit (HOSPITAL_COMMUNITY)
Admission: RE | Admit: 2013-04-15 | Discharge: 2013-04-15 | Disposition: A | Discharge status: Home / Self Care | Payer: Medicare Other | Source: Ambulatory Visit | Attending: Orthopedic Surgery | Admitting: Orthopedic Surgery

## 2013-04-15 DIAGNOSIS — M25569 Pain in unspecified knee: Secondary | ICD-10-CM | POA: Insufficient documentation

## 2013-04-15 DIAGNOSIS — M25669 Stiffness of unspecified knee, not elsewhere classified: Secondary | ICD-10-CM | POA: Insufficient documentation

## 2013-04-15 DIAGNOSIS — M6281 Muscle weakness (generalized): Secondary | ICD-10-CM | POA: Insufficient documentation

## 2013-04-15 DIAGNOSIS — Z96652 Presence of left artificial knee joint: Secondary | ICD-10-CM

## 2013-04-15 DIAGNOSIS — IMO0001 Reserved for inherently not codable concepts without codable children: Secondary | ICD-10-CM | POA: Insufficient documentation

## 2013-04-15 DIAGNOSIS — R262 Difficulty in walking, not elsewhere classified: Secondary | ICD-10-CM | POA: Insufficient documentation

## 2013-04-15 NOTE — Evaluation (Addendum)
Physical Therapy Evaluation  Patient Details  Name: [name] MRN: [id number] Date of Birth: 09-09-1955  Today's Date: 04/15/2013 Time: 1105-1150 PT Time Calculation (min): 45 min Charges: 1 evaluation              Visit#: 1 of 8  Re-eval: 05/15/13 Assessment Diagnosis: Lt TK revision  Surgical Date: 03/26/13 Next MD Visit: Dr. Luiz Blare: Oct 23 Prior Therapy: HHPT 3 weeks  Authorization: BCBS Medicare    Authorization Time Period:    Authorization Visit#: 1 of 10   Past Medical History:  Past Medical History  Diagnosis Date  . Hyperlipidemia     takes Simvastatin nightly  . Seasonal allergies     in spring  . History of migraine     last one was on 06/02/12;takes Topamax bid and Imitrex prn  . Arthritis   . Joint pain   . Joint swelling   . Insomnia     takes Trazodone nightly  . [id number](478.2)    Past Surgical History:  Past Surgical History  Procedure Laterality Date  . Cyst removed from right knee   1972  . Left knee surgery  1973  . Abdominal hysterectomy  1978  . Right knee arthroscopy  90's     x 3  . Right knee partial replacement  1991  . Bladder tacked  1999  . Breast surgery  2009    cyst removed from left breast  . Right wrist sugery  2008  . Scar tissue removed  2008  . Appendectomy  2008  . Right wrist surgery    . Left knee surgery  2012  . Left knee surgery  2012  . Right carpal tunnel    . Colonoscopy    . Artroplasty    . Total knee arthroplasty  06/12/2012    LEFT KNEE  . Total knee arthroplasty  06/12/2012    Procedure: TOTAL KNEE ARTHROPLASTY;  Surgeon: Loreta Ave, MD;  Location: [facility] OR;  Service: Orthopedics;  Laterality: Left;  . Total knee revision Left 03/26/2013    Procedure: TOTAL KNEE REVISION- left;  Surgeon: Harvie Junior, MD;  Location: [facility] OR;  Service: Orthopedics;  Laterality: Left;    Subjective Symptoms/Limitations Symptoms: Pt is a 57 year old female referred back to PT for Lt total knee revision completed  3 weeks ago.  She has been wearing a hinged brace since May to help alleviate the pain.  She reports that the MD states she had about a cup of dense adherent scar tissue removed from her knee.  According to her note in EP[facility] she initally had a 9 mm poly which was to small and improved with a 19mm poly which provides greater stability to her knee.  At this time she her c/co is difficulty sleeping, pain to her knee, difficulty walking long distances and overall feeling of weakness to her Lt leg.  States she is attempting reciprocal pattern with her stairs at home.  Limitations: Walking;Standing;Sitting How long can you sit comfortably?: 15 minutes How long can you stand comfortably?: 15 minutes  How long can you walk comfortably?: 10 minutes Patient Stated Goals: be able to walk better and bend his knee better.  Pain Assessment Currently in Pain?: Yes Pain Score: 3  Pain Location: Knee Pain Orientation: Left Pain Type: Surgical pain Pain Frequency: Intermittent Pain Relieving Factors: ice and pain medication  Precautions/Restrictions  Precautions Precautions: Knee Precaution Booklet Issued: Yes (comment) Restrictions Weight Bearing Restrictions: Yes LLE Weight  Bearing: Weight bearing as tolerated  Prior Functioning  Home Living Family/patient expects to be discharged to:: Private residence Home Access: Stairs to enter [employer] of Steps: 9 Entrance Stairs-Rails: Right;Left;Can reach both Home Layout: One level Home Equipment: Environmental consultant - 2 wheels;Bedside commode;Grab bars - tub/shower;Hand held shower head  Sensation/Coordination/Flexibility/Functional Tests Functional Tests Functional Tests: FOTO: Status: 46%, limiatiaon: 54%  Assessment LLE AROM (degrees) Left Knee Flexion: 5 Left Ankle Dorsiflexion: 110 LLE PROM (degrees) Left Knee Extension: 0 Left Knee Flexion: 112 LLE Strength Left Hip Flexion: 4/5 Left Hip Extension: 4/5 Left Hip ABduction: 4/5 Left Hip  ADduction: 4/5 Left Knee Flexion: 4/5 Left Knee Extension: 4/5  Mobility/Balance  Ambulation/Gait Ambulation/Gait Assistance: 6: Modified independent (Device/Increase time) Assistive device: None Gait Pattern: Decreased hip/knee flexion - left;Decreased weight shift to left;Decreased stance time - left;Decreased trunk rotation   Exercise/Treatments Supine Quad Sets: Left;10 reps Short Arc Quad Sets: Left;10 reps Heel Slides: Left;10 reps Straight Leg Raises: Right;10 reps Sidelying Hip ABduction: Right;10 reps Hip ADduction: Right;10 reps Prone  Hamstring Curl: 5 reps    Physical Therapy Assessment and Plan PT Assessment and Plan Clinical Impression Statement: Pt is a 57 year old female, who was a former pt of this clinic after her inital Lt TKR, and is referred to PT s/p Lt TKR revision with impairments listed below.  At this time pt is ambulating independently, has improved knee mobility with decreased pain, is completing her HEP from HHPT and what she could rememeber from outpatient therapy.  Overall she reports greater satisfaction from the knee revision with less pain.  Pt will benefit from skilled therapeutic intervention in order to improve on the following deficits: Abnormal gait;Decreased activity tolerance;Decreased coordination;Pain;Impaired flexibility;Decreased strength;Increased muscle spasms;Impaired perceived functional ability;Increased fascial restricitons;Decreased mobility Rehab Potential: Good PT Frequency: Min 2X/week PT Duration: 6 weeks PT Treatment/Interventions: Gait training;Stair training;Functional mobility training;Therapeutic activities;Therapeutic exercise;Balance training;Neuromuscular re-education;Patient/family education;Modalities;Manual techniques PT Plan: Conitnue with squats, heel/toe raises, rocker board, SLS and progress mat activities    Goals Home Exercise Program Pt/caregiver will Perform Home Exercise Program: Independently PT Goal:  Perform Home Exercise Program - Progress: Goal set today PT Short Term Goals Time to Complete Short Term Goals: 3 weeks PT Short Term Goal 1: Pt will report pain less than 3/10 during the night time for greater ease with sleeping.  PT Short Term Goal 2: Pt will improve her Lt knee AROM 0-115 degrees to return to PLOF knee mobility.  PT Short Term Goal 3: Pt will improve her Lt distal quadrcieps and popliteus coordinated movements in order to perform a LAQ at end range to progress towards approprirate gait mechanics.  PT Long Term Goals Time to Complete Long Term Goals:  (6 weeks) PT Long Term Goal 1: Pt will improve knee AROM to [facility] in order to ambulate with appropriate gait mechanics to decrease risk of secondary injury.  PT Long Term Goal 2: Pt will improve her Lt knee strength to [facility] in order to ascend and descend 9 stairs with alternating pattern with pain no greater than a 2/10 in order to safely enter her home.  Long Term Goal 3: Pt will improve her activity tolerance to [facility] in order to ambulate x30 minutes independently with minimal gait impairments in order to go to the grocery store with greater ease.  Long Term Goal 4: Pt will improve her FOTO to statusL greater than 63% and limitation less than 37% for improved percieved functional ability.  Problem List Patient Active Problem  List   Diagnosis Date Noted  . Status post revision of total replacement of left knee 04/15/2013  . Pain due to total left knee replacement 03/27/2013  . Muscle weakness (generalized) 07/02/2012  . Total knee replacement status 07/02/2012  . Difficulty in walking 07/02/2012  . Stiffness of left knee 07/02/2012  . Pain in left knee 07/02/2012    PT Plan of Care PT Home Exercise Plan: given PT Patient Instructions: importance of HEP and adding weights and decreasing reps to strengthen quadricep Consulted and Agree with Plan of Care: Patient  GP Functional Assessment Tool Used: FOTO: Limitation:  54% Functional Limitation: Mobility: Walking and moving around Mobility: Walking and Moving Around Current Status (Z6109): At least 40 percent but less than 60 percent impaired, limited or restricted  Khrystyna Schwalm, MPT, ATC 04/15/2013, 12:23 PM  Physician Documentation Your signature is required to indicate approval of the treatment plan as stated above.  Please sign and either send electronically or make a copy of this report for your files and return this physician signed original.   Please mark one 1.__approve of plan  2. ___approve of plan with the following conditions.   ______________________________                                                          _____________________ Physician Signature                                                                                                             Date

## 2013-04-17 ENCOUNTER — Ambulatory Visit (HOSPITAL_COMMUNITY)
Admission: RE | Admit: 2013-04-17 | Discharge: 2013-04-17 | Disposition: A | Discharge status: Home / Self Care | Payer: Medicare Other | Source: Ambulatory Visit | Attending: Family Medicine | Admitting: Family Medicine

## 2013-04-17 DIAGNOSIS — Z96652 Presence of left artificial knee joint: Secondary | ICD-10-CM

## 2013-04-17 DIAGNOSIS — M25662 Stiffness of left knee, not elsewhere classified: Secondary | ICD-10-CM

## 2013-04-17 DIAGNOSIS — M25562 Pain in left knee: Secondary | ICD-10-CM

## 2013-04-17 DIAGNOSIS — M6281 Muscle weakness (generalized): Secondary | ICD-10-CM

## 2013-04-17 NOTE — Progress Notes (Signed)
Physical Therapy Treatment Patient Details  Name: [name] MRN: [id number] Date of Birth: 1955-08-11  Today's Date: 04/17/2013 Time: 0800-0843 PT Time Calculation (min): 43 min Charges: TE: 61' Visit#: 2 of 8  Re-eval: 05/15/13    Authorization: BCBS Medicare  Authorization Time Period:    Authorization Visit#: 1 of 10   Subjective: Symptoms/Limitations Symptoms: "I've been doing my exercises at home.Marland KitchenMarland KitchenMarland KitchenI tried to use a purse with a can for weight in it, but the purse was a little long."  Pt continues to have difficulty with sleep.  Pain Assessment Currently in Pain?: Yes Pain Score: 3  Pain Location: Knee  Precautions/Restrictions     Exercise/Treatments Aerobic Stationary Bike: NuStep: Hills 2 resistance 2 x10 minutes for strengthening Standing Heel Raises: 15 reps;Limitations Heel Raises Limitations: Toe raise 15 reps Knee Flexion: Left;15 reps Side Lunges: Left;10 reps Lateral Step Up: Left;Step Height: 4" Forward Step Up: Left;10 reps;Step Height: 4" Supine Quad Sets: Left;10 reps;Limitations Quad Sets Limitations: w/PT facilitation Short Arc Quad Sets: Left;10 reps;Limitations Short Arc Quad Sets Limitations: 5 sec holds 2#    Physical Therapy Assessment and Plan PT Assessment and Plan Clinical Impression Statement: Treatment focused on implementing adherent HEP.  d/c squats secondary to Rt knee pain and added bridges and Lt side lunges for strengthening. Pt tolerated all other exercises without increased pain.  Pt reports she has been doing her exercises at home that were given to her on inital evaluation.  Rehab Potential: Good PT Frequency: Min 2X/week PT Duration: 6 weeks PT Treatment/Interventions: Gait training;Stair training;Functional mobility training;Therapeutic activities;Therapeutic exercise;Balance training;Neuromuscular re-education;Patient/family education;Modalities;Manual techniques PT Plan: Conitnue with squats, heel/toe raises,  rocker board, SLS and progress mat activities    Goals Home Exercise Program Pt/caregiver will Perform Home Exercise Program: Independently PT Short Term Goals Time to Complete Short Term Goals: 3 weeks PT Short Term Goal 1: Pt will report pain less than 3/10 during the night time for greater ease with sleeping.  PT Short Term Goal 1 - Progress: Progressing toward goal PT Short Term Goal 2: Pt will improve her Lt knee AROM 0-115 degrees to return to PLOF knee mobility.  PT Short Term Goal 2 - Progress: Progressing toward goal PT Short Term Goal 3: Pt will improve her Lt distal quadrcieps and popliteus coordinated movements in order to perform a LAQ at end range to progress towards approprirate gait mechanics.  PT Short Term Goal 3 - Progress: Progressing toward goal PT Long Term Goals Time to Complete Long Term Goals:  (6 weeks) PT Long Term Goal 1: Pt will improve knee AROM to [facility] in order to ambulate with appropriate gait mechanics to decrease risk of secondary injury.  PT Long Term Goal 2: Pt will improve her Lt knee strength to [facility] in order to ascend and descend 9 stairs with alternating pattern with pain no greater than a 2/10 in order to safely enter her home.  Long Term Goal 3: Pt will improve her activity tolerance to [facility] in order to ambulate x30 minutes independently with minimal gait impairments in order to go to the grocery store with greater ease.  Long Term Goal 4: Pt will improve her FOTO to statusL greater than 63% and limitation less than 37% for improved percieved functional ability.  Problem List Patient Active Problem List   Diagnosis Date Noted  . Status post revision of total replacement of left knee 04/15/2013  . Pain due to total left knee replacement 03/27/2013  . Muscle weakness (generalized)  07/02/2012  . Total knee replacement status 07/02/2012  . Difficulty in walking 07/02/2012  . Stiffness of left knee 07/02/2012  . Pain in left knee 07/02/2012    PT -  End of Session Activity Tolerance: Patient tolerated treatment well PT Plan of Care PT Home Exercise Plan: given PT Patient Instructions: importance of HEP and adding weights and decreasing reps to strengthen quadricep Consulted and Agree with Plan of Care: Patient  GP Functional Assessment Tool Used: FOTO: Limitation: 54%  Kamel Haven, MPT, ATC 04/17/2013, 8:53 AM

## 2013-04-21 ENCOUNTER — Ambulatory Visit (HOSPITAL_COMMUNITY)
Admission: RE | Admit: 2013-04-21 | Discharge: 2013-04-21 | Disposition: A | Discharge status: Home / Self Care | Payer: Medicare Other | Source: Ambulatory Visit | Attending: Physical Therapy | Admitting: Physical Therapy

## 2013-04-21 NOTE — Progress Notes (Signed)
Physical Therapy Treatment Patient Details  Name: [name] MRN: [id number] Date of Birth: Oct 29, 1955  Today's Date: 04/21/2013 Time: 1650-1740 PT Time Calculation (min): 50 min  Visit#: 3 of 8  Re-eval: 05/15/13 Diagnosis: Lt TK revision  Surgical Date: 03/26/13 Next MD Visit: Dr. Luiz Blare: Oct 23 Prior Therapy: HHPT 3 weeks Authorization: BCBS Medicare  Authorization Visit#: 3 of 10  Charges:  therex 45  Subjective: Pt states her knee continues to swell and is unable to sleep all night due to discomfort.  Suggested ice prior to going to bed and frequently throughout the day with elevation.   Exercise/Treatments Aerobic Stationary Bike: NuStep: Hills 3 resistance 3 x10 minutes for strengthening Standing Heel Raises: 15 reps;Limitations Heel Raises Limitations: Toe raise 15 reps Knee Flexion: Left;15 reps Side Lunges: Left;10 reps Lateral Step Up: Left;Step Height: 4" Forward Step Up: Left;10 reps;Step Height: 4" Step Down: Left;10 reps;Step Height: 4";Hand Hold: 1 Supine Quad Sets: Left;10 reps;Limitations Short Arc Quad Sets: Left;10 reps;Limitations Short Arc Quad Sets Limitations: 5 sec hold Bridges: 10 reps Straight Leg Raises: Right;10 reps Sidelying Hip ABduction: Right;10 reps Hip ADduction: Right;10 reps Prone  Hamstring Curl: 10 reps     Physical Therapy Assessment and Plan PT Assessment and Plan Clinical Impression Statement: Continued focus on strengthening and stabilizing Lt knee and improving HEP compliance.  Added forward step downs with 4" step.  Pt requires VC's to bend knee and utilize eccentric control of quad with all activities.  Also requires frequent postural cues with all standing exercises and activities. PT Plan: Conitnue with exercise progression tapering to HEP.  Add rocker board, SLS and progress mat activities     Problem List Patient Active Problem List   Diagnosis Date Noted  . Status post revision of total replacement of  left knee 04/15/2013  . Pain due to total left knee replacement 03/27/2013  . Muscle weakness (generalized) 07/02/2012  . Total knee replacement status 07/02/2012  . Difficulty in walking 07/02/2012  . Stiffness of left knee 07/02/2012  . Pain in left knee 07/02/2012    PT - End of Session Activity Tolerance: Patient tolerated treatment well General Behavior During Therapy: [facility] for tasks assessed/performed PT Plan of Care Consulted and Agree with Plan of Care: Patient  GP Functional Assessment Tool Used: FOTO: Limitation: 54%  Lurena Nida, PTA/CLT 04/21/2013, 5:46 PM

## 2013-04-23 ENCOUNTER — Ambulatory Visit (HOSPITAL_COMMUNITY)
Admission: RE | Admit: 2013-04-23 | Discharge: 2013-04-23 | Disposition: A | Discharge status: Home / Self Care | Payer: Medicare Other | Source: Ambulatory Visit | Attending: Orthopedic Surgery | Admitting: Orthopedic Surgery

## 2013-04-23 NOTE — Progress Notes (Signed)
Physical Therapy Treatment Patient Details  Name: [name] MRN: [id number] Date of Birth: [birth year]  Today's Date: 04/23/2013 Time: 2841-3244 PT Time Calculation (min): 52 min Charges: Therex x 32' Ice x 10'  Visit#: 4 of 8  Re-eval: 05/15/13 Assessment Diagnosis: Lt TK revision  Surgical Date: 03/26/13 Next MD Visit: Dr. Luiz Blare: Oct 23  Authorization: BCBS Medicare  Authorization Visit#: 4 of 10   Subjective: Symptoms/Limitations Symptoms: Pt states that she had increased paint in left lateral knee after last session. Pain Assessment Currently in Pain?: Yes Pain Score: 3  Pain Location: Knee Pain Orientation: Left  Precautions/Restrictions    Assessment LLE PROM (degrees) Left Knee Extension: 0 Left Knee Flexion: 115  Exercise/Treatments Aerobic Stationary Bike: NuStep: Hills 3 resistance 3 x10 minutes for strengthening Standing Heel Raises: 15 reps;Limitations Lateral Step Up: Left;Step Height: 4" Forward Step Up: Left;10 reps;Step Height: 4" Supine Quad Sets: Left;10 reps;Limitations Quad Sets Limitations: 10" holds Short Arc [employer] Sets: Left;10 reps;Limitations Water quality scientist Limitations: 10" holds Terminal Knee Extension: 10 reps;Limitations Terminal Knee Extension Limitations: 10" holds  Physical Therapy Assessment and Plan PT Assessment and Plan Clinical Impression Statement: Forward step downs held this session secondary to shooting pains in left lateral knee when attempted. Active flexion has improved 3 degrees since initial evaluation. Pt requires multimodal cueing to improve distal quad contraction. Progress note sent to MD prior to MD visit. PT Plan: Conitnue with exercise progression to improve strength and ROM.  Add rocker board, SLS and progress mat activities     Problem List Patient Active Problem List   Diagnosis Date Noted  . Status post revision of total replacement of left knee 04/15/2013  . Pain due to total left knee  replacement 03/27/2013  . Muscle weakness (generalized) 07/02/2012  . Total knee replacement status 07/02/2012  . Difficulty in walking 07/02/2012  . Stiffness of left knee 07/02/2012  . Pain in left knee 07/02/2012    PT - End of Session Activity Tolerance: Patient tolerated treatment well General Behavior During Therapy: [facility] for tasks assessed/performed  Seth Bake, PTA 04/23/2013, 3:28 PM

## 2013-04-28 ENCOUNTER — Ambulatory Visit (HOSPITAL_COMMUNITY)
Admission: RE | Admit: 2013-04-28 | Discharge: 2013-04-28 | Disposition: A | Discharge status: Home / Self Care | Payer: Medicare Other | Source: Ambulatory Visit | Attending: Orthopedic Surgery | Admitting: Orthopedic Surgery

## 2013-04-28 NOTE — Progress Notes (Signed)
Physical Therapy Treatment Patient Details  Name: [name] MRN: [id number] Date of Birth: August 27, 1955  Today's Date: 04/28/2013 Time: 1103-1150 PT Time Calculation (min): 47 min Charges: Therex x 28'(1103-1131) Ice w/estim x 15'(1133 -1148)  Visit#: 5 of 8  Re-eval: 05/15/13  Authorization: BCBS Medicare  Authorization Visit#: 5 of 10   Subjective: Symptoms/Limitations Symptoms: Pt states the MD told her she has fluid on her knee. Pain Assessment Currently in Pain?: Yes Pain Score: 5  Pain Location: Knee Pain Orientation: Left  Exercise/Treatments Supine Quad Sets: Left;10 reps;Limitations Quad Sets Limitations:  (10" holds) Short Arc [employer] Sets: Left;10 reps;Limitations Short Arc Quad Sets Limitations: 10" holds Terminal Knee Extension: 10 reps;Limitations Terminal Knee Extension Limitations: 10" holds Straight Leg Raises: Right;10 reps   Modalities Modalities: Electrical Stimulation Cryotherapy Number Minutes Cryotherapy: 10 Minutes (With hi volt estim) Cryotherapy Location: Knee (Left) Type of Cryotherapy: Ice pack Pharmacologist Location: Left distal knee Electrical Stimulation Action: Decrease edema Electrical Stimulation Parameters: Hi volt @ 125 Electrical Stimulation Goals: Edema  Physical Therapy Assessment and Plan PT Assessment and Plan Clinical Impression Statement: Pt complains of increased pain and inflammation this session. Completed strengthening exercises in supine to avoid increasing inflammation. Ice and hi-volt estim applied at end of session to decrease swelling. Pt reports pain decrease to 2/10 at end of session. PT Plan: Conitnue with exercise progression to improve strength and ROM.      Problem List Patient Active Problem List   Diagnosis Date Noted  . Status post revision of total replacement of left knee 04/15/2013  . Pain due to total left knee replacement 03/27/2013  . Muscle weakness  (generalized) 07/02/2012  . Total knee replacement status 07/02/2012  . Difficulty in walking 07/02/2012  . Stiffness of left knee 07/02/2012  . Pain in left knee 07/02/2012    PT - End of Session Activity Tolerance: Patient tolerated treatment well General Behavior During Therapy: [facility] for tasks assessed/performed   Seth Bake, PTA  04/28/2013, 3:13 PM

## 2013-04-30 ENCOUNTER — Ambulatory Visit (HOSPITAL_COMMUNITY): Payer: Medicare Other | Admitting: Physical Therapy

## 2013-05-01 ENCOUNTER — Ambulatory Visit (HOSPITAL_COMMUNITY)
Admission: RE | Admit: 2013-05-01 | Discharge: 2013-05-01 | Disposition: A | Discharge status: Home / Self Care | Payer: Medicare Other | Source: Ambulatory Visit | Attending: Family Medicine | Admitting: Family Medicine

## 2013-05-01 NOTE — Progress Notes (Addendum)
Physical Therapy Treatment Patient Details  Name: [name] MRN: [id number] Date of Birth: June 19, 1956  Today's Date: 05/01/2013 Time: 1350-1445 PT Time Calculation (min): 55 min Charges: Therex x 30' Estim w/ice x 15'  Visit#: 6 of 8  Re-eval: 05/15/13  Authorization: BCBS Medicare  Authorization Visit#: 6 of 10   Subjective: Symptoms/Limitations Symptoms: Pt states that her knee is feeling much better today. Pain Assessment Currently in Pain?: Yes Pain Score: 5  Pain Location: Knee Pain Orientation: Left   Exercise/Treatments Stretches Gastroc Stretch: 3 reps;30 seconds Aerobic Stationary Bike: NuStep: Hills 3 resistance 3 x10 minutes for strengthening Supine Quad Sets: Left;10 reps;Limitations Bridges: 10 reps Straight Leg Raises: 10 reps;Right   Modalities Modalities: Electrical Stimulation Cryotherapy Cryotherapy Location: Knee (Left) Electrical Stimulation Electrical Stimulation Location: Left distal knee Electrical Stimulation Action: Decrease edema Electrical Stimulation Parameters: High volt 195 voltz Electrical Stimulation Goals: Edema  Physical Therapy Assessment and Plan PT Assessment and Plan Clinical Impression Statement: Pt continues to require multimodal cueing to improve distal quad contraction with quad sets and straight leg raises. Continue with ice and high volt estim to decrease swelling. Pt repots pain decrease to 2/10 at end of session. PT Plan: Conitnue with exercise progression to improve strength and ROM.     Goals    Problem List Patient Active Problem List   Diagnosis Date Noted  . Status post revision of total replacement of left knee 04/15/2013  . Pain due to total left knee replacement 03/27/2013  . Muscle weakness (generalized) 07/02/2012  . Total knee replacement status 07/02/2012  . Difficulty in walking 07/02/2012  . Stiffness of left knee 07/02/2012  . Pain in left knee 07/02/2012    PT - End of  Session Activity Tolerance: Patient tolerated treatment well General Behavior During Therapy: [facility] for tasks assessed/performed  Seth Bake, PTA  05/01/2013, 4:20 PM

## 2013-05-05 ENCOUNTER — Ambulatory Visit (HOSPITAL_COMMUNITY)
Admission: RE | Admit: 2013-05-05 | Discharge: 2013-05-05 | Disposition: A | Discharge status: Home / Self Care | Payer: Medicare Other | Source: Ambulatory Visit | Attending: Orthopedic Surgery | Admitting: Orthopedic Surgery

## 2013-05-05 DIAGNOSIS — M6281 Muscle weakness (generalized): Secondary | ICD-10-CM

## 2013-05-05 DIAGNOSIS — M25569 Pain in unspecified knee: Secondary | ICD-10-CM | POA: Insufficient documentation

## 2013-05-05 DIAGNOSIS — Z96652 Presence of left artificial knee joint: Secondary | ICD-10-CM

## 2013-05-05 DIAGNOSIS — M25662 Stiffness of left knee, not elsewhere classified: Secondary | ICD-10-CM

## 2013-05-05 DIAGNOSIS — M25562 Pain in left knee: Secondary | ICD-10-CM

## 2013-05-05 DIAGNOSIS — R262 Difficulty in walking, not elsewhere classified: Secondary | ICD-10-CM | POA: Insufficient documentation

## 2013-05-05 DIAGNOSIS — M25669 Stiffness of unspecified knee, not elsewhere classified: Secondary | ICD-10-CM | POA: Insufficient documentation

## 2013-05-05 DIAGNOSIS — IMO0001 Reserved for inherently not codable concepts without codable children: Secondary | ICD-10-CM | POA: Insufficient documentation

## 2013-05-05 NOTE — Progress Notes (Signed)
Physical Therapy Treatment Patient Details  Name: [name] MRN: [id number] Date of Birth: 09-Oct-1955  Today's Date: 05/05/2013 Time: 1054-1208 PT Time Calculation (min): 74 min Charges: TE: 4098-1191 Manual: 4782-9562 Ice: 1 Visit#: 7 of 8  Re-eval: 05/15/13   Authorization: BCBS Medicare  Authorization Time Period:    Authorization Visit#: 7 of 10   Subjective: Symptoms/Limitations Symptoms: Pt reports that she is still having a lot of swelling when she is staning up and walking.  She wants to be able to stand up better.  Pain Assessment Currently in Pain?: Yes  Precautions/Restrictions     Exercise/Treatments Aerobic Tread Mill: 10 minutes 1.8 mph w/mod-max VC for proper posture and gait mechanics Standing Heel Raises: 15 reps;Limitations Knee Flexion: Left;15 reps;Limitations Knee Flexion Limitations: 1# Side Lunges: Left;10 reps Lateral Step Up: Left;Step Height: 4" Forward Step Up: Left;10 reps;Step Height: 4" Seated Long Arc Quad: 10 reps;Left;Weights Long Arc Quad Weight: 1 lbs. Supine Short Arc Quad Sets: Left;10 reps;Limitations Short Arc Quad Sets Limitations: 4#   Manual Therapy Manual Therapy: Myofascial release Myofascial Release: To anterior, posterior, lateral and medial knee to decrease fascial restrictions and decrease pain with soft tissue massage after.  Cryotherapy Number Minutes Cryotherapy: 10 Minutes Cryotherapy Location: Knee (Left) Type of Cryotherapy: Ice pack  Physical Therapy Assessment and Plan PT Assessment and Plan Clinical Impression Statement: Added manual therapy at the end of session to decrease fascial restrictions throughout the knee.  Pt has decreased pain after session with improved gait mechanics.  Continued to add weight with LAQ and SAQ to improve functional strength. PT Plan: Re-eval    Goals    Problem List Patient Active Problem List   Diagnosis Date Noted  . Status post revision of total replacement  of left knee 04/15/2013  . Pain due to total left knee replacement 03/27/2013  . Muscle weakness (generalized) 07/02/2012  . Total knee replacement status 07/02/2012  . Difficulty in walking 07/02/2012  . Stiffness of left knee 07/02/2012  . Pain in left knee 07/02/2012    PT - End of Session Activity Tolerance: Patient tolerated treatment well General Behavior During Therapy: [facility] for tasks assessed/performed  GP    Starling Jessie, MPT, ATC 05/05/2013, 12:05 PM

## 2013-05-07 ENCOUNTER — Ambulatory Visit (HOSPITAL_COMMUNITY): Payer: Medicare Other | Admitting: Physical Therapy

## 2013-05-08 ENCOUNTER — Ambulatory Visit (HOSPITAL_COMMUNITY)
Admission: RE | Admit: 2013-05-08 | Discharge: 2013-05-08 | Disposition: A | Discharge status: Home / Self Care | Payer: Medicare Other | Source: Ambulatory Visit | Attending: Family Medicine | Admitting: Family Medicine

## 2013-05-08 DIAGNOSIS — M25562 Pain in left knee: Secondary | ICD-10-CM

## 2013-05-08 DIAGNOSIS — Z96652 Presence of left artificial knee joint: Secondary | ICD-10-CM

## 2013-05-08 DIAGNOSIS — M6281 Muscle weakness (generalized): Secondary | ICD-10-CM

## 2013-05-08 DIAGNOSIS — M25662 Stiffness of left knee, not elsewhere classified: Secondary | ICD-10-CM

## 2013-05-08 NOTE — Evaluation (Signed)
Physical Therapy Re-Evaluation  Patient Details  Name: [name] MRN: [id number] Date of Birth: 1955/08/02  Today's Date: 05/08/2013 Time: 7253-6644 PT Time Calculation (min): 41 min Charges: 1 ROM/1 MMT Self Care: 1310-1320 Manual: 1320-1335 IH:[id number]           Visit#: 8 of 12  Re-eval: 05/15/13 Assessment Diagnosis: Lt TK revision  Surgical Date: 03/26/13 Next MD Visit: Dr. Luiz Blare: Nov 20  Authorization: BCBS Medicare    Authorization Time Period:    Authorization Visit#: 8 of 10   Subjective Symptoms/Limitations Symptoms: Pt states she was really sore after last visit.  She was able to make her soup and then felt better the next day. still feels she is walking slow.  How long can you stand comfortably?: 15 minutes How long can you walk comfortably?: 10 minutes Pain Assessment Currently in Pain?: Yes Pain Score: 6  Pain Location: Knee   Assessment LLE AROM (degrees) Left Knee Flexion: 0 (was 5) Left Ankle Dorsiflexion: 119 (was 110) LLE PROM (degrees) Left Knee Extension: 0 (was 0) Left Knee Flexion: 123 (was 115) LLE Strength Left Hip Flexion: 5/5 (was 4/5) Left Hip Extension: 5/5 (was 4/5) Left Hip ABduction: 5/5 (was 4/5) Left Hip ADduction: 5/5 (was 4/5) Left Knee Flexion: 4/5 (was 4/5) Left Knee Extension: 5/5 (was 4/5) Palpation Palpation: moderate fascial restrictions to posterior fibular head and moderate fascial restrictions to anterior knee scar  Exercise/Treatments  Modalities Modalities: Ultrasound Manual Therapy Manual Therapy: Myofascial release Myofascial Release: To Lt posterior fibular head Cryotherapy Cryotherapy Location: Knee (Left)  Physical Therapy Assessment and Plan PT Assessment and Plan Clinical Impression Statement: [name] has attneded 8 OP PT visitis s/p Lt TKR revision with the following findings: continues to be limited due to pain to her knee over the joint line and posterior fibular head.  Improved knee  AROM 0-119 with pain at end range.  Will continue x2 weeks to decrease pain and improved functional AROM and improve overall function.  Pt will benefit from skilled therapeutic intervention in order to improve on the following deficits: Abnormal gait;Decreased activity tolerance;Decreased coordination;Pain;Impaired flexibility;Decreased strength;Increased muscle spasms;Impaired perceived functional ability;Increased fascial restricitons;Decreased mobility PT Frequency: Min 2X/week PT Duration:  (2 weeks) PT Treatment/Interventions: Gait training;Stair training;Functional mobility training;Therapeutic activities;Therapeutic exercise;Balance training;Neuromuscular re-education;Patient/family education;Modalities;Manual techniques PT Plan: Continue to improve gait speed using TM gati training. improve functional strength with stair training and improve LE functional strength and AROM.     Goals Home Exercise Program Pt/caregiver will Perform Home Exercise Program: Independently PT Short Term Goals Time to Complete Short Term Goals: 3 weeks PT Short Term Goal 1: Pt will report pain less than 3/10 during the night time for greater ease with sleeping.  PT Short Term Goal 1 - Progress: Progressing toward goal PT Short Term Goal 2: Pt will improve her Lt knee AROM 0-115 degrees to return to PLOF knee mobility.  PT Short Term Goal 2 - Progress: Met PT Short Term Goal 3: Pt will improve her Lt distal quadrcieps and popliteus coordinated movements in order to perform a LAQ at end range to progress towards approprirate gait mechanics.  PT Short Term Goal 3 - Progress: Met PT Long Term Goals Time to Complete Long Term Goals:  (6 weeks) PT Long Term Goal 1: Pt will improve knee AROM to [facility] in order to ambulate with appropriate gait mechanics to decrease risk of secondary injury.  PT Long Term Goal 1 - Progress: Progressing toward goal PT Long Term Goal  2: Pt will improve her Lt knee strength to [facility] in order  to ascend and descend 9 stairs with alternating pattern with pain no greater than a 2/10 in order to safely enter her home.  PT Long Term Goal 2 - Progress: Progressing toward goal Long Term Goal 3: Pt will improve her activity tolerance to [facility] in order to ambulate x30 minutes independently with minimal gait impairments in order to go to the grocery store with greater ease.  Long Term Goal 3 Progress: Progressing toward goal Long Term Goal 4: Pt will improve her FOTO to statusL greater than 63% and limitation less than 37% for improved percieved functional ability. Long Term Goal 4 Progress: Progressing toward goal  Problem List Patient Active Problem List   Diagnosis Date Noted  . Status post revision of total replacement of left knee 04/15/2013  . Pain due to total left knee replacement 03/27/2013  . Muscle weakness (generalized) 07/02/2012  . Total knee replacement status 07/02/2012  . Difficulty in walking 07/02/2012  . Stiffness of left knee 07/02/2012  . Pain in left knee 07/02/2012    PT - End of Session Activity Tolerance: Patient tolerated treatment well General Behavior During Therapy: [facility] for tasks assessed/performed PT Plan of Care PT Patient Instructions: discussed FOTO and goals  GP Functional Assessment Tool Used: FOTO: limitaion 57% Functional Limitation: Mobility: Walking and moving around Mobility: Walking and Moving Around Current Status ([id number]): At least 40 percent but less than 60 percent impaired, limited or restricted Mobility: Walking and Moving Around Goal Status [phone]): At least 20 percent but less than 40 percent impaired, limited or restricted  [name] 05/08/2013, 2:47 PM  Physician Documentation Your signature is required to indicate approval of the treatment plan as stated above.  Please sign and either send electronically or make a copy of this report for your files and return this physician signed original.   Please mark one 1.__approve of plan   2. ___approve of plan with the following conditions.   ______________________________                                                          _____________________ Physician Signature                                                                                                             Date

## 2013-05-09 ENCOUNTER — Other Ambulatory Visit: Payer: Self-pay

## 2013-05-09 DIAGNOSIS — Z1231 Encounter for screening mammogram for malignant neoplasm of breast: Secondary | ICD-10-CM

## 2013-05-14 ENCOUNTER — Ambulatory Visit (HOSPITAL_COMMUNITY)
Admission: RE | Admit: 2013-05-14 | Discharge: 2013-05-14 | Disposition: A | Discharge status: Home / Self Care | Payer: Medicare Other | Source: Ambulatory Visit | Attending: Family Medicine | Admitting: Family Medicine

## 2013-05-14 DIAGNOSIS — M25662 Stiffness of left knee, not elsewhere classified: Secondary | ICD-10-CM

## 2013-05-14 DIAGNOSIS — M6281 Muscle weakness (generalized): Secondary | ICD-10-CM

## 2013-05-14 DIAGNOSIS — Z96652 Presence of left artificial knee joint: Secondary | ICD-10-CM

## 2013-05-14 DIAGNOSIS — M25562 Pain in left knee: Secondary | ICD-10-CM

## 2013-05-14 NOTE — Progress Notes (Signed)
Physical Therapy Treatment Patient Details  Name: [name] MRN: [id number] Date of Birth: July 15, 1955  Today's Date: 05/14/2013 Time: 1302-1401 PT Time Calculation (min): 59 min Charges TE: 2841-3244 Manual: 0102-7253 Ice: 1351-1401 Visit#: 9 of 12  Re-eval: 05/15/13    Authorization: [employer] Medicare  Authorization Time Period:    Authorization Visit#: 9 of 18   Subjective: Symptoms/Limitations Symptoms: Pt reports she is stiff when walking.  has a spot at the top of her incision that feels like a water droplet.  Pain Assessment Currently in Pain?: Yes Pain Location: Knee  Precautions/Restrictions     Exercise/Treatments Aerobic Tread Mill: 10 minutes 1.6 mph w/cueing for posture and gait mechanics Standing Knee Flexion: Left;Limitations;20 reps Knee Flexion Limitations: 1# Lateral Step Up: Left;Step Height: 4";20 reps Forward Step Up: Left;Step Height: 4";20 reps Sidelying Hip ABduction: 20 reps Other Sidelying Knee Exercises: Hip circles forward x10, backwards x10 resp  Manual Therapy Manual Therapy: Myofascial release Myofascial Release: To Lt posterior fibular head, distal ITB to decrease fascial restrictions and pain with trigger point release  Cryotherapy Cryotherapy Location: Knee (Left)  Physical Therapy Assessment and Plan PT Assessment and Plan PT Frequency: Min 2X/week PT Duration:  (2 weeks) PT Treatment/Interventions: Gait training;Stair training;Functional mobility training;Therapeutic activities;Therapeutic exercise;Balance training;Neuromuscular re-education;Patient/family education;Modalities;Manual techniques PT Plan: Continue to improve gait speed using TM gati training. improve functional strength with stair training and improve LE functional strength and AROM.     Goals    Problem List Patient Active Problem List   Diagnosis Date Noted  . Status post revision of total replacement of left knee 04/15/2013  . Pain due to total left  knee replacement 03/27/2013  . Muscle weakness (generalized) 07/02/2012  . Total knee replacement status 07/02/2012  . Difficulty in walking 07/02/2012  . Stiffness of left knee 07/02/2012  . Pain in left knee 07/02/2012    PT - End of Session Activity Tolerance: Patient tolerated treatment well General Behavior During Therapy: [facility] for tasks assessed/performed PT Plan of Care PT Patient Instructions: discussed FOTO and goals  GP Functional Assessment Tool Used: FOTO: limitaion 57%  Nataliee Shurtz, MPT, ATC 05/14/2013, 3:45 PM

## 2013-05-16 ENCOUNTER — Ambulatory Visit (HOSPITAL_COMMUNITY)
Admission: RE | Admit: 2013-05-16 | Discharge: 2013-05-16 | Disposition: A | Discharge status: Home / Self Care | Payer: Medicare Other | Source: Ambulatory Visit | Attending: Family Medicine | Admitting: Family Medicine

## 2013-05-16 DIAGNOSIS — M6281 Muscle weakness (generalized): Secondary | ICD-10-CM

## 2013-05-16 DIAGNOSIS — Z96652 Presence of left artificial knee joint: Secondary | ICD-10-CM

## 2013-05-16 DIAGNOSIS — M25662 Stiffness of left knee, not elsewhere classified: Secondary | ICD-10-CM

## 2013-05-16 DIAGNOSIS — M25562 Pain in left knee: Secondary | ICD-10-CM

## 2013-05-16 NOTE — Progress Notes (Signed)
Physical Therapy Treatment Patient Details  Name: [name] MRN: [id number] Date of Birth: [birth year]  Today's Date: 05/16/2013 Time: 1303-1348 PT Time Calculation (min): 45 min Charges:  Gait: 2440-1027 Manual: 2536-6440 Visit#: 10 of 12  Re-eval: 05/15/13    Authorization: BCBS Medicare  Authorization Time Period:    Authorization Visit#: 9 of 18   Subjective: Symptoms/Limitations Symptoms: Pt states that she felt great after last time and felt she could go to [employer] and didn't have any pain when walking, but afterwards she had increased pain.  Pain Assessment Currently in Pain?: Yes Pain Score: 6  Pain Location: Knee Pain Orientation: Left;Lateral  Precautions/Restrictions     Exercise/Treatments Aerobic Tread Mill: 10 minutes 1.7 mph w/cueing for posture and gait mechanics  Manual Therapy Manual Therapy: Myofascial release Myofascial Release: To Lt knee: anterior, posterior, lateral to decrease all fascial restrictions around patella, quadricpes and hamstrings.  Cryotherapy Cryotherapy Location: Knee (Left)  Physical Therapy Assessment and Plan PT Assessment and Plan Clinical Impression Statement: Continues to have significant reduction in pain after manual techniques.  Continues to require max cueing for toe off to improve knee flexion during gait mechanics. Reports decreased stiffness at end of session. PT Frequency: Min 2X/week PT Duration:  (2 weeks) PT Treatment/Interventions: Gait training;Stair training;Functional mobility training;Therapeutic activities;Therapeutic exercise;Balance training;Neuromuscular re-education;Patient/family education;Modalities;Manual techniques PT Plan: Continue to improve gait speed using TM gati training. improve functional strength with stair training and improve LE functional strength and AROM.     Goals    Problem List Patient Active Problem List   Diagnosis Date Noted  . Status post revision of total replacement  of left knee 04/15/2013  . Pain due to total left knee replacement 03/27/2013  . Muscle weakness (generalized) 07/02/2012  . Total knee replacement status 07/02/2012  . Difficulty in walking 07/02/2012  . Stiffness of left knee 07/02/2012  . Pain in left knee 07/02/2012    PT - End of Session Activity Tolerance: Patient tolerated treatment well General Behavior During Therapy: [facility] for tasks assessed/performed  GP Functional Assessment Tool Used: FOTO: limitaion 57%  Erric Machnik, MPT, ATC 05/16/2013, 2:06 PM

## 2013-05-19 ENCOUNTER — Ambulatory Visit (HOSPITAL_COMMUNITY)
Admission: RE | Admit: 2013-05-19 | Discharge: 2013-05-19 | Disposition: A | Discharge status: Home / Self Care | Payer: Medicare Other | Source: Ambulatory Visit | Attending: Family Medicine | Admitting: Family Medicine

## 2013-05-19 DIAGNOSIS — M25662 Stiffness of left knee, not elsewhere classified: Secondary | ICD-10-CM

## 2013-05-19 DIAGNOSIS — M6281 Muscle weakness (generalized): Secondary | ICD-10-CM

## 2013-05-19 DIAGNOSIS — M25562 Pain in left knee: Secondary | ICD-10-CM

## 2013-05-19 DIAGNOSIS — Z96652 Presence of left artificial knee joint: Secondary | ICD-10-CM

## 2013-05-19 NOTE — Progress Notes (Signed)
Physical Therapy Treatment Patient Details  Name: [name] MRN: [id number] Date of Birth: 09/26/1955  Today's Date: 05/19/2013 Time: 4098-1191 PT Time Calculation (min): 54 min Charges: TE: 1301-1320 Manual: 4782-9562 Ice: 1 Visit#: 11 of 12  Re-eval: 05/15/13 Assessment Diagnosis: Lt TK revision  Surgical Date: 03/26/13 Next MD Visit: Dr. Luiz Blare: Nov 20  Authorization: BCBS Medicare  Authorization Time Period:    Authorization Visit#: 11 of 18   Subjective: Symptoms/Limitations Symptoms: Reports that she is doing better since her last PT visit. She was able to cook with less pain.  Pain Assessment Currently in Pain?: Yes  Precautions/Restrictions     Exercise/Treatments Mobility/Balance        Stretches   Aerobic Tread Mill: 10 minutes 1.7 mph w/cueing for posture and gait mechanics Machines for Strengthening   Plyometrics   Standing Knee Flexion: Left;Limitations;20 reps Knee Flexion Limitations: TKF Lateral Step Up: Left;Step Height: 4";20 reps;Hand Hold: 0;Limitations Lateral Step Up Limitations: Hip Hikes x10 reps 4 inch Forward Step Up: Left;Step Height: 4";20 reps;Hand Hold: 0 SLS: 3x30 sec Seated   Supine   Sidelying   Prone      Manual Therapy Manual Therapy: Myofascial release Myofascial Release: To Lt knee: anterior, posterior, lateral to decrease all fascial restrictions around patella, quadricpes and hamstrings Cryotherapy Number Minutes Cryotherapy: 10 Minutes Cryotherapy Location: Knee Type of Cryotherapy: Ice pack  Physical Therapy Assessment and Plan PT Assessment and Plan PT Frequency: Min 2X/week PT Duration:  (2 weeks) PT Treatment/Interventions: Gait training;Stair training;Functional mobility training;Therapeutic activities;Therapeutic exercise;Balance training;Neuromuscular re-education;Patient/family education;Modalities;Manual techniques PT Plan: Continue to improve gait speed using TM gati training. improve  functional strength with stair training and improve LE functional strength and AROM.     Goals    Problem List Patient Active Problem List   Diagnosis Date Noted  . Status post revision of total replacement of left knee 04/15/2013  . Pain due to total left knee replacement 03/27/2013  . Muscle weakness (generalized) 07/02/2012  . Total knee replacement status 07/02/2012  . Difficulty in walking 07/02/2012  . Stiffness of left knee 07/02/2012  . Pain in left knee 07/02/2012    PT - End of Session Activity Tolerance: Patient tolerated treatment well General Behavior During Therapy: [facility] for tasks assessed/performed  GP Functional Assessment Tool Used: FOTO: limitaion 57%  Bluford Sedler 05/19/2013, 3:36 PM

## 2013-05-21 ENCOUNTER — Ambulatory Visit (HOSPITAL_COMMUNITY)
Admission: RE | Admit: 2013-05-21 | Discharge: 2013-05-21 | Disposition: A | Discharge status: Home / Self Care | Payer: Medicare Other | Source: Ambulatory Visit | Attending: Family Medicine | Admitting: Family Medicine

## 2013-05-21 DIAGNOSIS — M25562 Pain in left knee: Secondary | ICD-10-CM

## 2013-05-21 DIAGNOSIS — Z96652 Presence of left artificial knee joint: Secondary | ICD-10-CM

## 2013-05-21 DIAGNOSIS — M25662 Stiffness of left knee, not elsewhere classified: Secondary | ICD-10-CM

## 2013-05-21 DIAGNOSIS — M6281 Muscle weakness (generalized): Secondary | ICD-10-CM

## 2013-05-21 NOTE — Evaluation (Signed)
Physical Therapy Discharge Summary/Treatment  Patient Details  Name: [name] MRN: [id number] Date of Birth: 03-16-1956  Today's Date: 05/21/2013 Time: 4098-1191 PT Time Calculation (min): 44 min Charges: TE: 1301-1330 ROM/MMT: 1 Self Care: 1335-1345            Visit#: 12 of 12  Re-eval: 05/15/13 Assessment Diagnosis: Lt TK revision  Surgical Date: 03/26/13 Next MD Visit: Dr. Luiz Blare: Nov 20  Authorization: BCBS Medicare    Authorization Time Period:    Authorization Visit#: 12 of 18   Subjective Symptoms/Limitations Symptoms: Pt reports she was very sore after last time and is feeling better today.  Pain Assessment Currently in Pain?: Yes Pain Score: 5   Sensation/Coordination/Flexibility/Functional Tests Functional Tests Functional Tests: FOTO:  Status: 43%, Liimiation 57% (was Status: 46%, limiatiaon: 54%  LLE AROM (degrees) Left Knee Flexion: 0 (was 0) Left Ankle Dorsiflexion: 124 (was 119) LLE PROM (degrees) Left Knee Extension: 0 (was 0) Left Knee Flexion: 124 (was 123) LLE Strength Left Hip Flexion: 5/5 (was 5/52) Left Hip Extension: 5/5 (was 5/5) Left Hip ABduction: 5/5 (was 5/5) Left Hip ADduction: 5/5 Left Knee Flexion: 5/5 (was 4/5) Left Knee Extension: 5/5 (was 5/5) Palpation Palpation: moderate fascial restrictions to posterior fibular head and moderate fascial restrictions to anterior knee scar  Mobility/Balance  Ambulation/Gait Ambulation/Gait Assistance: 7: Independent Assistive device: None Gait Pattern: Right foot flat   Exercise/Treatments Ankle Exercises - Seated Towel Crunch: 2 reps Marble Pickup: 1 set 10 reps Heel Raises: 15 reps Toe Raise: 15 reps Other Seated Ankle Exercises: Toe Yoga: Great toe and digit opp movements; toe abduction w/VC and TC.   Physical Therapy Assessment and Plan PT Assessment and Plan Clinical Impression Statement: Mrs. Mainer has attended 12 OP PT visits over the past 4 weeks s/p Lt TKR  revision with the following findings: at this time pt is still limited by due pain to lateral knee and ankle, knee pain is relieved which has relief after manual techniques and last for a few days with continued moderate restrictions to ITB.  At this time her Lt knee AROM and strength is [facility] and will D/C pt with an HEP.  PT Frequency: Min 2X/week PT Duration:  (2 weeks) PT Treatment/Interventions: Gait training;Stair training;Functional mobility training;Therapeutic activities;Therapeutic exercise;Balance training;Neuromuscular re-education;Patient/family education;Modalities;Manual techniques PT Plan: D/C with HEP    Goals Home Exercise Program Pt/caregiver will Perform Home Exercise Program: Independently PT Goal: Perform Home Exercise Program - Progress: Met PT Short Term Goals Time to Complete Short Term Goals: 3 weeks PT Short Term Goal 1: Pt will report pain less than 3/10 during the night time for greater ease with sleeping.  (avg: 5/10) PT Short Term Goal 1 - Progress: Progressing toward goal PT Short Term Goal 2: Pt will improve her Lt knee AROM 0-115 degrees to return to PLOF knee mobility.  PT Short Term Goal 2 - Progress: Met PT Short Term Goal 3: Pt will improve her Lt distal quadrcieps and popliteus coordinated movements in order to perform a LAQ at end range to progress towards approprirate gait mechanics.  PT Short Term Goal 3 - Progress: Met PT Long Term Goals Time to Complete Long Term Goals:  (6 weeks) PT Long Term Goal 1: Pt will improve knee AROM to [facility] in order to ambulate with appropriate gait mechanics to decrease risk of secondary injury.  PT Long Term Goal 1 - Progress: Met PT Long Term Goal 2: Pt will improve her Lt knee strength to [facility]  in order to ascend and descend 9 stairs with alternating pattern with pain no greater than a 2/10 in order to safely enter her home.  (no pain) PT Long Term Goal 2 - Progress: Met Long Term Goal 3: Pt will improve her activity  tolerance to [facility] in order to ambulate x30 minutes independently with minimal gait impairments in order to go to the grocery store with greater ease.  (15 minutes) Long Term Goal 3 Progress: Progressing toward goal Long Term Goal 4: Pt will improve her FOTO to statusL greater than 63% and limitation less than 37% for improved percieved functional ability. Long Term Goal 4 Progress: Not met  Problem List Patient Active Problem List   Diagnosis Date Noted  . Status post revision of total replacement of left knee 04/15/2013  . Pain due to total left knee replacement 03/27/2013  . Muscle weakness (generalized) 07/02/2012  . Total knee replacement status 07/02/2012  . Difficulty in walking 07/02/2012  . Stiffness of left knee 07/02/2012  . Pain in left knee 07/02/2012    PT - End of Session Activity Tolerance: Patient tolerated treatment well General Behavior During Therapy: WFL for tasks assessed/performed PT Plan of Care PT Patient Instructions: discused and pt demo for ankle and toe exercises to decrease ankle pain to improve gait mechanics.  Consulted and Agree with Plan of Care: Patient  GP Functional Assessment Tool Used: FOTO: see scanned repot Functional Limitation: Mobility: Walking and moving around Mobility: Walking and Moving Around Goal Status [phone]): At least 20 percent but less than 40 percent impaired, limited or restricted Mobility: Walking and Moving Around Discharge Status [phone]): At least 40 percent but less than 60 percent impaired, limited or restricted  Dominque Levandowski, MPT, ATC 05/21/2013, 2:17 PM   Physician Documentation Your signature is required to indicate approval of the treatment plan as stated above.  Please sign and either send electronically or make a copy of this report for your files and return this physician signed original.   Please mark one 1.__approve of plan  2. ___approve of plan with the following conditions.   ______________________________                                                           _____________________ Physician Signature                                                                                                             Date

## 2013-06-12 ENCOUNTER — Ambulatory Visit
Admission: RE | Admit: 2013-06-12 | Discharge: 2013-06-12 | Disposition: A | Discharge status: Home / Self Care | Payer: Medicare Other | Source: Ambulatory Visit

## 2013-06-12 DIAGNOSIS — Z1231 Encounter for screening mammogram for malignant neoplasm of breast: Secondary | ICD-10-CM

## 2014-03-24 ENCOUNTER — Other Ambulatory Visit: Payer: Self-pay | Admitting: Obstetrics and Gynecology

## 2014-03-25 LAB — CYTOLOGY - PAP

## 2014-04-08 ENCOUNTER — Other Ambulatory Visit: Payer: Self-pay | Admitting: Obstetrics and Gynecology

## 2014-04-09 LAB — CYTOLOGY - PAP

## 2014-05-15 ENCOUNTER — Other Ambulatory Visit: Payer: Self-pay

## 2014-05-15 DIAGNOSIS — Z1231 Encounter for screening mammogram for malignant neoplasm of breast: Secondary | ICD-10-CM

## 2014-06-16 ENCOUNTER — Ambulatory Visit
Admission: RE | Admit: 2014-06-16 | Discharge: 2014-06-16 | Disposition: A | Discharge status: Home / Self Care | Payer: Medicare Other | Source: Ambulatory Visit

## 2014-06-16 DIAGNOSIS — Z1231 Encounter for screening mammogram for malignant neoplasm of breast: Secondary | ICD-10-CM

## 2015-03-16 ENCOUNTER — Other Ambulatory Visit: Payer: Self-pay | Admitting: Orthopedic Surgery

## 2015-03-26 ENCOUNTER — Encounter (HOSPITAL_COMMUNITY)
Admission: RE | Admit: 2015-03-26 | Discharge: 2015-03-26 | Disposition: A | Discharge status: Home / Self Care | Payer: Medicare Other | Source: Ambulatory Visit | Attending: Orthopedic Surgery | Admitting: Orthopedic Surgery

## 2015-03-26 ENCOUNTER — Encounter (HOSPITAL_COMMUNITY): Payer: Self-pay

## 2015-03-26 ENCOUNTER — Other Ambulatory Visit: Payer: Self-pay | Admitting: Orthopedic Surgery

## 2015-03-26 ENCOUNTER — Other Ambulatory Visit (HOSPITAL_COMMUNITY): Payer: Self-pay

## 2015-03-26 DIAGNOSIS — Z7982 Long term (current) use of aspirin: Secondary | ICD-10-CM | POA: Diagnosis not present

## 2015-03-26 DIAGNOSIS — Z96652 Presence of left artificial knee joint: Secondary | ICD-10-CM | POA: Insufficient documentation

## 2015-03-26 DIAGNOSIS — Z0183 Encounter for blood typing: Secondary | ICD-10-CM | POA: Diagnosis not present

## 2015-03-26 DIAGNOSIS — Z79899 Other long term (current) drug therapy: Secondary | ICD-10-CM | POA: Insufficient documentation

## 2015-03-26 DIAGNOSIS — Z01812 Encounter for preprocedural laboratory examination: Secondary | ICD-10-CM | POA: Diagnosis not present

## 2015-03-26 DIAGNOSIS — Z87891 Personal history of nicotine dependence: Secondary | ICD-10-CM | POA: Insufficient documentation

## 2015-03-26 DIAGNOSIS — Z01818 Encounter for other preprocedural examination: Secondary | ICD-10-CM | POA: Insufficient documentation

## 2015-03-26 HISTORY — DX: Malignant (primary) neoplasm, unspecified: C80.1

## 2015-03-26 LAB — CBC WITH DIFFERENTIAL/PLATELET
Basophils Absolute: 0 10*3/uL (ref 0.0–0.1)
Basophils Relative: 1 %
EOS ABS: 0.1 10*3/uL (ref 0.0–0.7)
EOS PCT: 2 %
HCT: 42.5 % (ref 36.0–46.0)
Hemoglobin: 13.8 g/dL (ref 12.0–15.0)
LYMPHS ABS: 1.4 10*3/uL (ref 0.7–4.0)
LYMPHS PCT: 32 %
MCH: 28.3 pg (ref 26.0–34.0)
MCHC: 32.5 g/dL (ref 30.0–36.0)
MCV: 87.3 fL (ref 78.0–100.0)
MONO ABS: 0.4 10*3/uL (ref 0.1–1.0)
Monocytes Relative: 8 %
Neutro Abs: 2.6 10*3/uL (ref 1.7–7.7)
Neutrophils Relative %: 57 %
PLATELETS: 304 10*3/uL (ref 150–400)
RBC: 4.87 MIL/uL (ref 3.87–5.11)
RDW: 13.1 % (ref 11.5–15.5)
WBC: 4.5 10*3/uL (ref 4.0–10.5)

## 2015-03-26 LAB — URINE MICROSCOPIC-ADD ON

## 2015-03-26 LAB — URINALYSIS, ROUTINE W REFLEX MICROSCOPIC
Bilirubin Urine: NEGATIVE
GLUCOSE, UA: NEGATIVE mg/dL
HGB URINE DIPSTICK: NEGATIVE
KETONES UR: NEGATIVE mg/dL
Nitrite: NEGATIVE
PH: 7 (ref 5.0–8.0)
Protein, ur: NEGATIVE mg/dL
Specific Gravity, Urine: 1.021 (ref 1.005–1.030)
Urobilinogen, UA: 1 mg/dL (ref 0.0–1.0)

## 2015-03-26 LAB — COMPREHENSIVE METABOLIC PANEL
ALT: 13 U/L — ABNORMAL LOW (ref 14–54)
ANION GAP: 9 (ref 5–15)
AST: 18 U/L (ref 15–41)
Albumin: 4 g/dL (ref 3.5–5.0)
Alkaline Phosphatase: 66 U/L (ref 38–126)
BUN: 18 mg/dL (ref 6–20)
CHLORIDE: 106 mmol/L (ref 101–111)
CO2: 27 mmol/L (ref 22–32)
CREATININE: 0.96 mg/dL (ref 0.44–1.00)
Calcium: 9.4 mg/dL (ref 8.9–10.3)
Glucose, Bld: 80 mg/dL (ref 65–99)
POTASSIUM: 3.3 mmol/L — AB (ref 3.5–5.1)
SODIUM: 142 mmol/L (ref 135–145)
Total Bilirubin: 0.5 mg/dL (ref 0.3–1.2)
Total Protein: 6.7 g/dL (ref 6.5–8.1)

## 2015-03-26 LAB — TYPE AND SCREEN
ABO/RH(D): O POS
ANTIBODY SCREEN: NEGATIVE

## 2015-03-26 LAB — PROTIME-INR
INR: 0.97 (ref 0.00–1.49)
PROTHROMBIN TIME: 13.1 s (ref 11.6–15.2)

## 2015-03-26 LAB — APTT: aPTT: 28 seconds (ref 24–37)

## 2015-03-26 NOTE — Progress Notes (Signed)
PCP: Bonna Gains at [facility] in [place].  Pt. States she gets very nervous on the day of surgery. Pt. Requesting to see PA for anesthesia to discuss options for anesthesia and her anxiety level.

## 2015-03-26 NOTE — Pre-Procedure Instructions (Signed)
    [name]  03/26/2015      [place], [place] [address] [place] [address] Phone: [phone] Fax: [phone]    Your procedure is scheduled on Monday, Oct. 3  Report to [facility] Admitting at 10:00   A.M.  Call this number if you have problems the morning of surgery:  [phone]              Any questions prior to surgery call  ([phone] Monday-Friday 8am-4pm)   Remember:  Do not eat food or drink liquids after midnight.   Take these medicines the morning of surgery with A SIP OF WATER : tramadol if needed, imitrex if needed              Stop aspirin, vitamins, nonsteroidal anti-inflammatory medicines:advil, ibuprofen,motrin, BC'S 1 week prior to surgery   Do not wear jewelry, make-up or nail polish.  Do not wear lotions, powders, or perfumes.  You may wear deodorant.  Do not shave 48 hours prior to surgery.  Men may shave face and neck.  Do not bring valuables to the hospital.  [facility] is not responsible for any belongings or valuables.  Contacts, dentures or bridgework may not be worn into surgery.  Leave your suitcase in the car.  After surgery it may be brought to your room.  For patients admitted to the hospital, discharge time will be determined by your treatment team.  Patients discharged the day of surgery will not be allowed to drive home.   Name and phone number of your driver:    Special instructions:  Review preparing for surgery handout  Please read over the following fact sheets that you were given. Pain Booklet, Coughing and Deep Breathing, Blood Transfusion Information and Surgical Site Infection Prevention

## 2015-03-31 ENCOUNTER — Other Ambulatory Visit: Payer: Self-pay | Admitting: Obstetrics and Gynecology

## 2015-03-31 NOTE — Progress Notes (Signed)
Anesthesia consult:  Pt is 59 year old female scheduled for R total knee revision on 04/05/2015 with Dr. Berenice Primas.   PCP is Dr. Elsie Lincoln in [place].  PMH includes: HLD, hysterectomy, appendectomy 2008, left TKA by Dr. Percell Miller on 06/12/12 (with femoral nerve block and GA/ETT). S/p L total knee revision 03/26/13 (with nerve block and GA). Former smoker. BMI 25.   Anesthesia concerns: She gets so worked up and anxious prior to surgery that she requests sedative type medication ASAP on arrival to [place] on the day of surgery. This has been a huge issue in the past, even after taking Valium po. She had a previous breast surgery that was suppose to be done under MAC, but they were never able to get comfortable enough to do the procedure. She says Dr. Debroah Loop PA told her that she was difficult to get under anesthesia for her left TKA, but anesthesia record don't really speak to this. She reports GA/nerve block she had with L total knee revision in 2014 was wonderful and she requests GA/nerve block again for this surgery. She also had post-operative nausea for 3 weeks following her TKA in 06/2012, so requests anti-emetic medication.   Meds: ASA, Imitrex, Topamax, Ultram, trazadone.   Preoperative labs reviewed.   Chest x-ray 03/26/2015 reviewed. No acute cardiopulmonary disease.  EKG 03/26/2015: NSR.   If no changes, I anticipate pt can proceed with surgery as scheduled.   Willeen Cass, FNP-BC [facility] Short Stay Surgical Center/Anesthesiology Phone: [phone] 03/31/2015 4:13 PM

## 2015-04-02 LAB — CYTOLOGY - PAP

## 2015-04-04 MED ORDER — CEFAZOLIN SODIUM-DEXTROSE 2-3 GM-% IV SOLR
2.0000 g | INTRAVENOUS | Status: AC
  Administered 2015-04-05: 2 g via INTRAVENOUS
  Filled 2015-04-04: qty 50

## 2015-04-04 MED ORDER — CHLORHEXIDINE GLUCONATE 4 % EX LIQD: 60.0000 mL | Freq: Once | CUTANEOUS | Status: DC

## 2015-04-05 ENCOUNTER — Inpatient Hospital Stay (HOSPITAL_COMMUNITY): Payer: Medicare Other | Admitting: Anesthesiology

## 2015-04-05 ENCOUNTER — Encounter (HOSPITAL_COMMUNITY): Payer: Self-pay | Admitting: Certified Registered Nurse Anesthetist

## 2015-04-05 ENCOUNTER — Inpatient Hospital Stay (HOSPITAL_COMMUNITY): Payer: Medicare Other | Admitting: Emergency Medicine

## 2015-04-05 ENCOUNTER — Inpatient Hospital Stay (HOSPITAL_COMMUNITY)
Admission: RE | Admit: 2015-04-05 | Discharge: 2015-04-06 | DRG: 468 | Disposition: A | Discharge status: Home Health | Payer: Medicare Other | Source: Ambulatory Visit | Attending: Orthopedic Surgery | Admitting: Orthopedic Surgery

## 2015-04-05 ENCOUNTER — Encounter (HOSPITAL_COMMUNITY)
Admission: RE | Disposition: A | Discharge status: Home Health | Payer: Self-pay | Source: Ambulatory Visit | Attending: Orthopedic Surgery

## 2015-04-05 DIAGNOSIS — Z96653 Presence of artificial knee joint, bilateral: Secondary | ICD-10-CM | POA: Diagnosis present

## 2015-04-05 DIAGNOSIS — Z79899 Other long term (current) drug therapy: Secondary | ICD-10-CM

## 2015-04-05 DIAGNOSIS — Y831 Surgical operation with implant of artificial internal device as the cause of abnormal reaction of the patient, or of later complication, without mention of misadventure at the time of the procedure: Secondary | ICD-10-CM | POA: Diagnosis present

## 2015-04-05 DIAGNOSIS — Z96659 Presence of unspecified artificial knee joint: Secondary | ICD-10-CM

## 2015-04-05 DIAGNOSIS — E785 Hyperlipidemia, unspecified: Secondary | ICD-10-CM | POA: Diagnosis present

## 2015-04-05 DIAGNOSIS — G4700 Insomnia, unspecified: Secondary | ICD-10-CM | POA: Diagnosis present

## 2015-04-05 DIAGNOSIS — T8484XA Pain due to internal orthopedic prosthetic devices, implants and grafts, initial encounter: Secondary | ICD-10-CM | POA: Diagnosis present

## 2015-04-05 DIAGNOSIS — Z87891 Personal history of nicotine dependence: Secondary | ICD-10-CM

## 2015-04-05 DIAGNOSIS — M25561 Pain in right knee: Secondary | ICD-10-CM | POA: Diagnosis present

## 2015-04-05 DIAGNOSIS — M1711 Unilateral primary osteoarthritis, right knee: Secondary | ICD-10-CM | POA: Diagnosis present

## 2015-04-05 DIAGNOSIS — Z7982 Long term (current) use of aspirin: Secondary | ICD-10-CM

## 2015-04-05 HISTORY — PX: TOTAL KNEE REVISION: SHX996

## 2015-04-05 HISTORY — PX: TOTAL KNEE ARTHROPLASTY: SHX125

## 2015-04-05 SURGERY — TOTAL KNEE REVISION
Anesthesia: Regional | Site: Knee | Laterality: Right

## 2015-04-05 MED ORDER — KETOROLAC TROMETHAMINE 15 MG/ML IJ SOLN
INTRAMUSCULAR | Status: AC
  Filled 2015-04-05: qty 1

## 2015-04-05 MED ORDER — KETAMINE HCL 100 MG/ML IJ SOLN
INTRAMUSCULAR | Status: AC
  Filled 2015-04-05: qty 1

## 2015-04-05 MED ORDER — KETAMINE HCL 100 MG/ML IJ SOLN
INTRAMUSCULAR | Status: DC | PRN
  Administered 2015-04-05: 150 mg via INTRAVENOUS
  Administered 2015-04-05: 25 mg via INTRAVENOUS
  Administered 2015-04-05: 50 mg via INTRAVENOUS

## 2015-04-05 MED ORDER — TRANEXAMIC ACID 1000 MG/10ML IV SOLN
1000.0000 mg | INTRAVENOUS | Status: AC
  Administered 2015-04-05: 1000 mg via INTRAVENOUS
  Filled 2015-04-05: qty 10

## 2015-04-05 MED ORDER — SODIUM CHLORIDE 0.9 % IJ SOLN
INTRAMUSCULAR | Status: AC
  Filled 2015-04-05: qty 10

## 2015-04-05 MED ORDER — CEFUROXIME SODIUM 750 MG IJ SOLR
INTRAMUSCULAR | Status: DC | PRN
  Administered 2015-04-05: 1.5 g

## 2015-04-05 MED ORDER — HYDROMORPHONE HCL 1 MG/ML IJ SOLN
INTRAMUSCULAR | Status: AC
  Filled 2015-04-05: qty 1

## 2015-04-05 MED ORDER — MIDAZOLAM HCL 2 MG/2ML IJ SOLN
INTRAMUSCULAR | Status: AC
  Administered 2015-04-05: 2 mg
  Filled 2015-04-05: qty 2

## 2015-04-05 MED ORDER — OXYCODONE HCL ER 10 MG PO T12A
20.0000 mg | EXTENDED_RELEASE_TABLET | Freq: Two times a day (BID) | ORAL | Status: DC
  Administered 2015-04-05 – 2015-04-06 (×2): 20 mg via ORAL
  Filled 2015-04-05 (×2): qty 2

## 2015-04-05 MED ORDER — MIDAZOLAM HCL 2 MG/2ML IJ SOLN
1.0000 mg | Freq: Once | INTRAMUSCULAR | Status: AC
  Administered 2015-04-05: 1 mg via INTRAVENOUS

## 2015-04-05 MED ORDER — DIPHENHYDRAMINE HCL 12.5 MG/5ML PO ELIX
12.5000 mg | ORAL_SOLUTION | ORAL | Status: DC | PRN
Start: 2015-04-05 — End: 2015-04-06

## 2015-04-05 MED ORDER — ACETAMINOPHEN 650 MG RE SUPP: 650.0000 mg | Freq: Four times a day (QID) | RECTAL | Status: DC | PRN

## 2015-04-05 MED ORDER — MIDAZOLAM HCL 2 MG/2ML IJ SOLN
INTRAMUSCULAR | Status: AC
  Administered 2015-04-05 (×2): 1 mg via INTRAVENOUS
  Filled 2015-04-05: qty 2

## 2015-04-05 MED ORDER — OXYCODONE HCL 20 MG PO TABS: 20.0000 mg | ORAL_TABLET | Freq: Two times a day (BID) | ORAL | Status: DC

## 2015-04-05 MED ORDER — ONDANSETRON HCL 4 MG/2ML IJ SOLN
INTRAMUSCULAR | Status: DC | PRN
  Administered 2015-04-05: 4 mg via INTRAVENOUS

## 2015-04-05 MED ORDER — LIDOCAINE HCL (CARDIAC) 20 MG/ML IV SOLN
INTRAVENOUS | Status: DC | PRN
  Administered 2015-04-05: 100 mg via INTRAVENOUS

## 2015-04-05 MED ORDER — DOCUSATE SODIUM 100 MG PO CAPS
100.0000 mg | ORAL_CAPSULE | Freq: Two times a day (BID) | ORAL | Status: DC
  Administered 2015-04-06: 100 mg via ORAL
  Filled 2015-04-05 (×2): qty 1

## 2015-04-05 MED ORDER — DEXAMETHASONE SODIUM PHOSPHATE 4 MG/ML IJ SOLN
INTRAMUSCULAR | Status: AC
  Filled 2015-04-05: qty 1

## 2015-04-05 MED ORDER — PROPOFOL 10 MG/ML IV BOLUS
INTRAVENOUS | Status: DC | PRN
  Administered 2015-04-05: 50 mg via INTRAVENOUS
  Administered 2015-04-05: 150 mg via INTRAVENOUS

## 2015-04-05 MED ORDER — TIZANIDINE HCL 2 MG PO TABS: 2.0000 mg | ORAL_TABLET | Freq: Four times a day (QID) | ORAL | Status: DC | PRN

## 2015-04-05 MED ORDER — PROMETHAZINE HCL 25 MG/ML IJ SOLN: 6.2500 mg | INTRAMUSCULAR | Status: DC | PRN

## 2015-04-05 MED ORDER — DEXTROSE 5 % IV SOLN
500.0000 mg | Freq: Four times a day (QID) | INTRAVENOUS | Status: DC | PRN
  Administered 2015-04-05: 500 mg via INTRAVENOUS
  Filled 2015-04-05 (×2): qty 5

## 2015-04-05 MED ORDER — TRAZODONE HCL 100 MG PO TABS
100.0000 mg | ORAL_TABLET | Freq: Every day | ORAL | Status: DC
  Administered 2015-04-05: 100 mg via ORAL
  Filled 2015-04-05: qty 1

## 2015-04-05 MED ORDER — FENTANYL CITRATE (PF) 250 MCG/5ML IJ SOLN
INTRAMUSCULAR | Status: AC
  Filled 2015-04-05: qty 5

## 2015-04-05 MED ORDER — OXYCODONE-ACETAMINOPHEN 5-325 MG PO TABS
ORAL_TABLET | ORAL | Status: AC
  Filled 2015-04-05: qty 2

## 2015-04-05 MED ORDER — TOPIRAMATE 25 MG PO TABS
50.0000 mg | ORAL_TABLET | Freq: Two times a day (BID) | ORAL | Status: DC
Start: 2015-04-05 — End: 2015-04-06
  Administered 2015-04-05 – 2015-04-06 (×2): 50 mg via ORAL
  Filled 2015-04-05 (×2): qty 2

## 2015-04-05 MED ORDER — ALUM & MAG HYDROXIDE-SIMETH 200-200-20 MG/5ML PO SUSP: 30.0000 mL | ORAL | Status: DC | PRN

## 2015-04-05 MED ORDER — DEXAMETHASONE SODIUM PHOSPHATE 10 MG/ML IJ SOLN
20.0000 mg | Freq: Two times a day (BID) | INTRAMUSCULAR | Status: DC
  Administered 2015-04-05 – 2015-04-06 (×2): 20 mg via INTRAVENOUS
  Filled 2015-04-05 (×3): qty 2

## 2015-04-05 MED ORDER — HYDROMORPHONE HCL 1 MG/ML IJ SOLN
1.0000 mg | INTRAMUSCULAR | Status: DC | PRN
  Administered 2015-04-05: 1 mg via INTRAVENOUS
  Filled 2015-04-05: qty 1

## 2015-04-05 MED ORDER — KETOROLAC TROMETHAMINE 15 MG/ML IJ SOLN
15.0000 mg | Freq: Three times a day (TID) | INTRAMUSCULAR | Status: DC
  Administered 2015-04-05 – 2015-04-06 (×3): 15 mg via INTRAVENOUS
  Filled 2015-04-05 (×3): qty 1

## 2015-04-05 MED ORDER — SUCCINYLCHOLINE CHLORIDE 20 MG/ML IJ SOLN
INTRAMUSCULAR | Status: DC | PRN
  Administered 2015-04-05: 100 mg via INTRAVENOUS

## 2015-04-05 MED ORDER — BISACODYL 5 MG PO TBEC: 5.0000 mg | DELAYED_RELEASE_TABLET | Freq: Every day | ORAL | Status: DC | PRN

## 2015-04-05 MED ORDER — CEFUROXIME SODIUM 1.5 G IJ SOLR
INTRAMUSCULAR | Status: AC
  Filled 2015-04-05: qty 1.5

## 2015-04-05 MED ORDER — METHOCARBAMOL 500 MG PO TABS
500.0000 mg | ORAL_TABLET | Freq: Four times a day (QID) | ORAL | Status: DC | PRN
  Administered 2015-04-06: 500 mg via ORAL
  Filled 2015-04-05 (×2): qty 1

## 2015-04-05 MED ORDER — ASPIRIN EC 325 MG PO TBEC
325.0000 mg | DELAYED_RELEASE_TABLET | Freq: Two times a day (BID) | ORAL | Status: DC
  Administered 2015-04-05 – 2015-04-06 (×2): 325 mg via ORAL
  Filled 2015-04-05 (×2): qty 1

## 2015-04-05 MED ORDER — BUPIVACAINE LIPOSOME 1.3 % IJ SUSP
INTRAMUSCULAR | Status: DC | PRN
  Administered 2015-04-05: 20 mL

## 2015-04-05 MED ORDER — OXYCODONE-ACETAMINOPHEN 5-325 MG PO TABS: 1.0000 | ORAL_TABLET | ORAL | Status: DC | PRN

## 2015-04-05 MED ORDER — LACTATED RINGERS IV SOLN
INTRAVENOUS | Status: DC | PRN
  Administered 2015-04-05: 13:00:00 via INTRAVENOUS

## 2015-04-05 MED ORDER — ONDANSETRON HCL 4 MG PO TABS: 4.0000 mg | ORAL_TABLET | Freq: Four times a day (QID) | ORAL | Status: DC | PRN

## 2015-04-05 MED ORDER — ACETAMINOPHEN 325 MG PO TABS: 650.0000 mg | ORAL_TABLET | Freq: Four times a day (QID) | ORAL | Status: DC | PRN

## 2015-04-05 MED ORDER — SODIUM CHLORIDE 0.9 % IR SOLN
Status: DC | PRN
  Administered 2015-04-05: 1000 mL

## 2015-04-05 MED ORDER — LACTATED RINGERS IV SOLN
INTRAVENOUS | Status: DC
  Administered 2015-04-05: 11:00:00 via INTRAVENOUS

## 2015-04-05 MED ORDER — MIDAZOLAM HCL 2 MG/2ML IJ SOLN
INTRAMUSCULAR | Status: AC
  Filled 2015-04-05: qty 2

## 2015-04-05 MED ORDER — FENTANYL CITRATE (PF) 100 MCG/2ML IJ SOLN
INTRAMUSCULAR | Status: DC | PRN
  Administered 2015-04-05 (×4): 50 ug via INTRAVENOUS
  Administered 2015-04-05: 100 ug via INTRAVENOUS

## 2015-04-05 MED ORDER — CEFAZOLIN SODIUM-DEXTROSE 2-3 GM-% IV SOLR
2.0000 g | Freq: Four times a day (QID) | INTRAVENOUS | Status: AC
Start: 2015-04-05 — End: 2015-04-06
  Administered 2015-04-05 – 2015-04-06 (×2): 2 g via INTRAVENOUS
  Filled 2015-04-05 (×2): qty 50

## 2015-04-05 MED ORDER — MIDAZOLAM HCL 2 MG/2ML IJ SOLN
2.0000 mg | Freq: Once | INTRAMUSCULAR | Status: AC
  Administered 2015-04-05: 2 mg via INTRAVENOUS

## 2015-04-05 MED ORDER — ACETAMINOPHEN 10 MG/ML IV SOLN
INTRAVENOUS | Status: AC
  Administered 2015-04-05: 1000 mg
  Filled 2015-04-05: qty 100

## 2015-04-05 MED ORDER — FENTANYL CITRATE (PF) 100 MCG/2ML IJ SOLN
INTRAMUSCULAR | Status: AC
  Administered 2015-04-05: 100 ug
  Filled 2015-04-05: qty 2

## 2015-04-05 MED ORDER — HYDROMORPHONE HCL 1 MG/ML IJ SOLN
0.2500 mg | INTRAMUSCULAR | Status: DC | PRN
  Administered 2015-04-05 (×4): 0.5 mg via INTRAVENOUS

## 2015-04-05 MED ORDER — ONDANSETRON HCL 4 MG/2ML IJ SOLN
INTRAMUSCULAR | Status: AC
  Filled 2015-04-05: qty 2

## 2015-04-05 MED ORDER — ASPIRIN EC 325 MG PO TBEC
325.0000 mg | DELAYED_RELEASE_TABLET | Freq: Two times a day (BID) | ORAL | Status: DC
Start: 2015-04-05 — End: 2022-12-20

## 2015-04-05 MED ORDER — BUPIVACAINE HCL (PF) 0.5 % IJ SOLN
INTRAMUSCULAR | Status: DC | PRN
  Administered 2015-04-05: 20 mL

## 2015-04-05 MED ORDER — BUPIVACAINE LIPOSOME 1.3 % IJ SUSP
20.0000 mL | INTRAMUSCULAR | Status: DC
  Filled 2015-04-05: qty 20

## 2015-04-05 MED ORDER — ZOLPIDEM TARTRATE 5 MG PO TABS: 5.0000 mg | ORAL_TABLET | Freq: Every evening | ORAL | Status: DC | PRN

## 2015-04-05 MED ORDER — BUPIVACAINE-EPINEPHRINE (PF) 0.5% -1:200000 IJ SOLN
INTRAMUSCULAR | Status: DC | PRN
  Administered 2015-04-05: 30 mL via PERINEURAL

## 2015-04-05 MED ORDER — MAGNESIUM CITRATE PO SOLN: 1.0000 | Freq: Once | ORAL | Status: DC | PRN

## 2015-04-05 MED ORDER — MEPERIDINE HCL 25 MG/ML IJ SOLN: 6.2500 mg | INTRAMUSCULAR | Status: DC | PRN

## 2015-04-05 MED ORDER — SUMATRIPTAN SUCCINATE 50 MG PO TABS
100.0000 mg | ORAL_TABLET | ORAL | Status: DC | PRN
  Filled 2015-04-05: qty 2

## 2015-04-05 MED ORDER — SODIUM CHLORIDE 0.9 % IV SOLN: INTRAVENOUS | Status: DC

## 2015-04-05 MED ORDER — OXYCODONE-ACETAMINOPHEN 5-325 MG PO TABS
1.0000 | ORAL_TABLET | ORAL | Status: DC | PRN
  Administered 2015-04-05 – 2015-04-06 (×4): 2 via ORAL
  Filled 2015-04-05 (×3): qty 2

## 2015-04-05 MED ORDER — ONDANSETRON HCL 4 MG/2ML IJ SOLN: 4.0000 mg | Freq: Four times a day (QID) | INTRAMUSCULAR | Status: DC | PRN

## 2015-04-05 MED ORDER — PROMETHAZINE HCL 25 MG/ML IJ SOLN: 12.5000 mg | Freq: Four times a day (QID) | INTRAMUSCULAR | Status: DC | PRN

## 2015-04-05 MED ORDER — DEXAMETHASONE SODIUM PHOSPHATE 4 MG/ML IJ SOLN
INTRAMUSCULAR | Status: DC | PRN
  Administered 2015-04-05: 4 mg via INTRAVENOUS

## 2015-04-05 MED ORDER — EPHEDRINE SULFATE 50 MG/ML IJ SOLN
INTRAMUSCULAR | Status: AC
  Filled 2015-04-05: qty 1

## 2015-04-05 MED ORDER — POLYETHYLENE GLYCOL 3350 17 G PO PACK
17.0000 g | PACK | Freq: Every day | ORAL | Status: DC | PRN
Start: 2015-04-05 — End: 2015-04-06

## 2015-04-05 SURGICAL SUPPLY — 67 items
APL SKNCLS STERI-STRIP NONHPOA (GAUZE/BANDAGES/DRESSINGS) ×1
BANDAGE ELASTIC 6 VELCRO ST LF (GAUZE/BANDAGES/DRESSINGS) ×1 IMPLANT
BANDAGE ESMARK 6X9 LF (GAUZE/BANDAGES/DRESSINGS) ×1 IMPLANT
BENZOIN TINCTURE PRP APPL 2/3 (GAUZE/BANDAGES/DRESSINGS) ×2 IMPLANT
BLADE SAGITTAL 25.0X1.19X90 (BLADE) ×2 IMPLANT
BLADE SAW SGTL 13X75X1.27 (BLADE) ×2 IMPLANT
BNDG CMPR 9X6 STRL LF SNTH (GAUZE/BANDAGES/DRESSINGS) ×1
BNDG ESMARK 6X9 LF (GAUZE/BANDAGES/DRESSINGS) ×2
BOWL SMART MIX CTS (DISPOSABLE) ×2 IMPLANT
CAP KNEE TOTAL 3 SIGMA ×1 IMPLANT
CEMENT HV SMART SET (Cement) ×2 IMPLANT
COVER BACK TABLE 24X17X13 BIG (DRAPES) IMPLANT
COVER SURGICAL LIGHT HANDLE (MISCELLANEOUS) ×2 IMPLANT
CUFF TOURNIQUET SINGLE 34IN LL (TOURNIQUET CUFF) IMPLANT
CUFF TOURNIQUET SINGLE 44IN (TOURNIQUET CUFF) IMPLANT
DRAPE EXTREMITY T 121X128X90 (DRAPE) ×2 IMPLANT
DRAPE IMP U-DRAPE 54X76 (DRAPES) ×2 IMPLANT
DRAPE U-SHAPE 47X51 STRL (DRAPES) ×2 IMPLANT
DRSG AQUACEL AG ADV 3.5X10 (GAUZE/BANDAGES/DRESSINGS) ×1 IMPLANT
DRSG PAD ABDOMINAL 8X10 ST (GAUZE/BANDAGES/DRESSINGS) ×2 IMPLANT
DURAPREP 26ML APPLICATOR (WOUND CARE) ×2 IMPLANT
ELECT REM PT RETURN 9FT ADLT (ELECTROSURGICAL) ×2
ELECTRODE REM PT RTRN 9FT ADLT (ELECTROSURGICAL) ×1 IMPLANT
EVACUATOR 1/8 PVC DRAIN (DRAIN) ×2 IMPLANT
FACESHIELD WRAPAROUND (MASK) ×2 IMPLANT
FACESHIELD WRAPAROUND OR TEAM (MASK) ×1 IMPLANT
GAUZE SPONGE 4X4 12PLY STRL (GAUZE/BANDAGES/DRESSINGS) ×2 IMPLANT
GAUZE XEROFORM 5X9 LF (GAUZE/BANDAGES/DRESSINGS) ×2 IMPLANT
GLOVE BIOGEL PI IND STRL 8 (GLOVE) ×2 IMPLANT
GLOVE BIOGEL PI INDICATOR 8 (GLOVE) ×2
GLOVE ECLIPSE 7.5 STRL STRAW (GLOVE) ×4 IMPLANT
GOWN STRL REUS W/ TWL LRG LVL3 (GOWN DISPOSABLE) ×1 IMPLANT
GOWN STRL REUS W/ TWL XL LVL3 (GOWN DISPOSABLE) ×2 IMPLANT
GOWN STRL REUS W/TWL LRG LVL3 (GOWN DISPOSABLE) ×2
GOWN STRL REUS W/TWL XL LVL3 (GOWN DISPOSABLE) ×4
HANDPIECE INTERPULSE COAX TIP (DISPOSABLE) ×2
HOOD PEEL AWAY FACE SHEILD DIS (HOOD) ×6 IMPLANT
IMMOBILIZER KNEE 20 (SOFTGOODS) IMPLANT
IMMOBILIZER KNEE 22 UNIV (SOFTGOODS) IMPLANT
IMMOBILIZER KNEE 24 THIGH 36 (MISCELLANEOUS) IMPLANT
IMMOBILIZER KNEE 24 UNIV (MISCELLANEOUS)
KIT BASIN OR (CUSTOM PROCEDURE TRAY) ×2 IMPLANT
KIT ROOM TURNOVER OR (KITS) ×2 IMPLANT
MANIFOLD NEPTUNE II (INSTRUMENTS) ×2 IMPLANT
NDL HYPO 25GX1X1/2 BEV (NEEDLE) IMPLANT
NEEDLE HYPO 25GX1X1/2 BEV (NEEDLE) IMPLANT
NS IRRIG 1000ML POUR BTL (IV SOLUTION) ×2 IMPLANT
PACK TOTAL JOINT (CUSTOM PROCEDURE TRAY) ×2 IMPLANT
PACK UNIVERSAL I (CUSTOM PROCEDURE TRAY) ×2 IMPLANT
PAD ARMBOARD 7.5X6 YLW CONV (MISCELLANEOUS) ×4 IMPLANT
PAD CAST 4YDX4 CTTN HI CHSV (CAST SUPPLIES) ×1 IMPLANT
PADDING CAST COTTON 4X4 STRL (CAST SUPPLIES) ×2
SET HNDPC FAN SPRY TIP SCT (DISPOSABLE) ×1 IMPLANT
STAPLER VISISTAT 35W (STAPLE) IMPLANT
STRIP CLOSURE SKIN 1/2X4 (GAUZE/BANDAGES/DRESSINGS) ×2 IMPLANT
SUCTION FRAZIER TIP 10 FR DISP (SUCTIONS) ×2 IMPLANT
SUT MON AB 3-0 SH 27 (SUTURE)
SUT MON AB 3-0 SH27 (SUTURE) IMPLANT
SUT VIC AB 0 CTB1 27 (SUTURE) ×4 IMPLANT
SUT VIC AB 1 CT1 27 (SUTURE) ×4
SUT VIC AB 1 CT1 27XBRD ANBCTR (SUTURE) ×2 IMPLANT
SUT VIC AB 2-0 CTB1 (SUTURE) ×4 IMPLANT
SYR CONTROL 10ML LL (SYRINGE) IMPLANT
TOWEL OR 17X24 6PK STRL BLUE (TOWEL DISPOSABLE) ×2 IMPLANT
TOWEL OR 17X26 10 PK STRL BLUE (TOWEL DISPOSABLE) ×2 IMPLANT
TUBE ANAEROBIC SPECIMEN COL (MISCELLANEOUS) ×2 IMPLANT
WATER STERILE IRR 1000ML POUR (IV SOLUTION) ×6 IMPLANT

## 2015-04-05 NOTE — Anesthesia Procedure Notes (Addendum)
Anesthesia Regional Block:  Adductor canal block  Pre-Anesthetic Checklist: ,, timeout performed, Correct Patient, Correct Site, Correct Laterality, Correct Procedure, Correct Position, site marked, Risks and benefits discussed, Surgical consent,  Pre-op evaluation,  Post-op pain management  Laterality: Right  Prep: chloraprep       Needles:  Injection technique: Single-shot  Needle Type: Stimiplex     Needle Length: 9cm 9 cm Needle Gauge: 21 and 21 G    Additional Needles:  Procedures: ultrasound guided (picture in chart) Adductor canal block Narrative:  Injection made incrementally with aspirations every 5 mL.  Performed by: Personally  Anesthesiologist: Nolon Nations  Additional Notes: BP cuff, EKG monitors applied. Sedation begun. Artery and nerve location verified with U/S and anesthetic injected incrementally, slowly, and after negative aspirations under direct u/s guidance. Good fascial /perineural spread. Tolerated well.   Procedure Name: Intubation Date/Time: 04/05/2015 1:06 PM Performed by: Ollen Bowl Pre-anesthesia Checklist: Patient identified, Emergency Drugs available, Suction available, Patient being monitored and Timeout performed Patient Re-evaluated:Patient Re-evaluated prior to inductionOxygen Delivery Method: Circle system utilized Preoxygenation: Pre-oxygenation with 100% oxygen Intubation Type: IV induction and Combination inhalational/ intravenous induction Ventilation: Mask ventilation without difficulty Laryngoscope Size: Miller and 2 Grade View: Grade I Tube type: Oral Tube size: 7.5 mm Number of attempts: 1 Airway Equipment and Method: Stylet and Bite block Placement Confirmation: ETT inserted through vocal cords under direct vision,  positive ETCO2 and breath sounds checked- equal and bilateral Secured at: 22 cm Tube secured with: Tape Dental Injury: Teeth and Oropharynx as per pre-operative assessment

## 2015-04-05 NOTE — Discharge Instructions (Signed)

## 2015-04-05 NOTE — Anesthesia Preprocedure Evaluation (Signed)
Anesthesia Evaluation  Patient identified by MRN, date of birth, ID band Patient awake    Reviewed: Allergy & Precautions, H&P , NPO status , Patient's Chart, lab work & pertinent test results  History of Anesthesia Complications (+) PONV  Airway Mallampati: I  TM Distance: >3 FB Neck ROM: Full    Dental no notable dental hx.    Pulmonary former smoker,    Pulmonary exam normal breath sounds clear to auscultation       Cardiovascular negative cardio ROS Normal cardiovascular exam Rhythm:Regular Rate:Normal     Neuro/Psych  Headaches, PSYCHIATRIC DISORDERS Anxiety negative neurological ROS     GI/Hepatic negative GI ROS, Neg liver ROS,   Endo/Other  negative endocrine ROS  Renal/GU negative Renal ROS     Musculoskeletal  (+) Arthritis ,   Abdominal   Peds  Hematology negative hematology ROS (+)   Anesthesia Other Findings   Reproductive/Obstetrics negative OB ROS                             Anesthesia Physical  Anesthesia Plan  ASA: II  Anesthesia Plan: General and Regional   Post-op Pain Management: MAC Combined w/ Regional for Post-op pain   Induction: Intravenous  Airway Management Planned: Oral ETT  Additional Equipment:   Intra-op Plan:   Post-operative Plan: Extubation in OR  Informed Consent: I have reviewed the patients History and Physical, chart, labs and discussed the procedure including the risks, benefits and alternatives for the proposed anesthesia with the patient or authorized representative who has indicated his/her understanding and acceptance.   Dental advisory given  Plan Discussed with: CRNA  Anesthesia Plan Comments:         Anesthesia Quick Evaluation

## 2015-04-05 NOTE — H&P (Signed)
TOTAL KNEE REVISION ADMISSION H&P  Patient is being admitted for right revision total knee arthroplasty.  Subjective:  Chief Complaint:right knee pain.  HPI: [name], 59 y.o. female, has a history of pain and functional disability in the right knee(s) due to failed previous arthroplasty and patient has failed non-surgical conservative treatments for greater than 12 weeks to include NSAID's and/or analgesics, corticosteriod injections, flexibility and strengthening excercises, use of assistive devices and activity modification. The indications for the revision of the total knee arthroplasty are progressive arthritis in the weightbearing area having previous patellofemoral replacement.. Onset of symptoms was gradual starting 5 years ago with gradually worsening course since that time.  Prior procedures on the right knee(s) include unicompartmental arthroplasty.  Patient currently rates pain in the right knee(s) at 8 out of 10 with activity. There is night pain, worsening of pain with activity and weight bearing, pain with passive range of motion, crepitus and joint swelling.  Patient has evidence of joint subluxation, joint space narrowing and prosthetic loosening by imaging studies. This condition presents safety issues increasing the risk of falls. This patient has had failure of unicompartmental arthroplasty.  There is no current active infection.  Patient Active Problem List   Diagnosis Date Noted  . Status post revision of total replacement of left knee 04/15/2013  . Pain due to total left knee replacement ([place]) 03/27/2013  . Muscle weakness (generalized) 07/02/2012  . Total knee replacement status 07/02/2012  . Difficulty in walking(719.7) 07/02/2012  . Stiffness of left knee 07/02/2012  . Pain in left knee 07/02/2012   Past Medical History  Diagnosis Date  . Hyperlipidemia     takes Simvastatin nightly  . Seasonal allergies     in spring  . History of migraine     last one  was on 06/02/12;takes Topamax bid and Imitrex prn  . Arthritis   . Joint pain   . Joint swelling   . Insomnia     takes Trazodone nightly  . Headache(784.0)   . Cancer [place]) 1978     cervical cancer    Past Surgical History  Procedure Laterality Date  . Cyst removed from right knee   1972  . Left knee surgery  1973  . Abdominal hysterectomy  1978  . Right knee arthroscopy  90's     x 3  . Right knee partial replacement  1991  . Bladder tacked  1999  . Breast surgery  2009    cyst removed from left breast  . Right wrist sugery  2008  . Scar tissue removed  2008  . Appendectomy  2008  . Right wrist surgery    . Left knee surgery  2012  . Left knee surgery  2012  . Right carpal tunnel    . Colonoscopy    . Artroplasty    . Total knee arthroplasty  06/12/2012    LEFT KNEE  . Total knee arthroplasty  06/12/2012    Procedure: TOTAL KNEE ARTHROPLASTY;  Surgeon: Ninetta Lights, MD;  Location: [facility];  Service: Orthopedics;  Laterality: Left;  . Total knee revision Left 03/26/2013    Procedure: TOTAL KNEE REVISION- left;  Surgeon: Alta Corning, MD;  Location: [facility];  Service: Orthopedics;  Laterality: Left;    Prescriptions prior to admission  Medication Sig Dispense Refill Last Dose  . aspirin EC 325 MG tablet Take 1 tablet (325 mg total) by mouth 2 (two) times daily after a meal. (Patient taking differently:  Take 325 mg by mouth daily. ) 60 tablet 0 1 week  . HYDROcodone-acetaminophen (NORCO/VICODIN) 5-325 MG tablet Take 1 tablet by mouth every 6 (six) hours as needed for moderate pain.   04/05/2015 at 0620  . SUMAtriptan (IMITREX) 100 MG tablet Take 100 mg by mouth every 2 (two) hours as needed for migraine.    04/05/2015 at Unknown time  . topiramate (TOPAMAX) 50 MG tablet Take 50 mg by mouth 2 (two) times daily.   04/05/2015 at Unknown time  . traMADol (ULTRAM-ER) 200 MG 24 hr tablet Take 200 mg by mouth 2 (two) times daily as needed for pain.   04/04/2015 at Unknown time  .  traZODone (DESYREL) 50 MG tablet Take 100 mg by mouth at bedtime.   04/04/2015 at Unknown time   Allergies  Allergen Reactions  . Mupirocin Other (See Comments)    Bumps on tongue and thickness to tongue     Social History  Substance Use Topics  . Smoking status: Former Smoker    Quit date: 06/12/2000  . Smokeless tobacco: Never Used     Comment: quit 10+yrs ago  . Alcohol Use: No    History reviewed. No pertinent family history.    ROS  ROS: I have reviewed the patient's review of systems thoroughly and there are no positive responses as relates to the HPI.  Objective:  Physical Exam  Vital signs in last 24 hours: Temp:  [97.8 F (36.6 C)] 97.8 F (36.6 C) (10/03 1040) Pulse Rate:  [66-72] 66 (10/03 1115) Resp:  [19-20] 19 (10/03 1115) BP: (131-145)/(70-76) 136/70 mmHg (10/03 1120) SpO2:  [100 %] 100 % (10/03 1115) Weight:  [153 lb (69.4 kg)] 153 lb (69.4 kg) (10/03 1040) Well-developed well-nourished patient in no acute distress. Alert and oriented x3 HEENT:within normal limits Cardiac: Regular rate and rhythm Pulmonary: Lungs clear to auscultation Abdomen: Soft and nontender.  Normal active bowel sounds  Musculoskeletal: Right knee: Tender to palpation over the lateral joint line.  Positive McMurray.  Pain through range of motion.  One plus effusion.  Range of motion 0-120.)  Labs: Recent Results (from the past 2160 hour(s))  Urinalysis, Routine w reflex microscopic (not at [facility])     Status: Abnormal   Collection Time: 03/26/15  2:03 PM  Result Value Ref Range   Color, Urine YELLOW YELLOW   APPearance CLOUDY (A) CLEAR   Specific Gravity, Urine 1.021 1.005 - 1.030   pH 7.0 5.0 - 8.0   Glucose, UA NEGATIVE NEGATIVE mg/dL   Hgb urine dipstick NEGATIVE NEGATIVE   Bilirubin Urine NEGATIVE NEGATIVE   Ketones, ur NEGATIVE NEGATIVE mg/dL   Protein, ur NEGATIVE NEGATIVE mg/dL   Urobilinogen, UA 1.0 0.0 - 1.0 mg/dL   Nitrite NEGATIVE NEGATIVE   Leukocytes, UA SMALL  (A) NEGATIVE  Urine microscopic-add on     Status: None   Collection Time: 03/26/15  2:03 PM  Result Value Ref Range   Squamous Epithelial / LPF RARE RARE   WBC, UA 0-2 <3 WBC/hpf   Urine-Other AMORPHOUS URATES/PHOSPHATES   APTT     Status: None   Collection Time: 03/26/15  2:04 PM  Result Value Ref Range   aPTT 28 24 - 37 seconds  CBC WITH DIFFERENTIAL     Status: None   Collection Time: 03/26/15  2:04 PM  Result Value Ref Range   WBC 4.5 4.0 - 10.5 K/uL   RBC 4.87 3.87 - 5.11 MIL/uL   Hemoglobin 13.8 12.0 - 15.0   g/dL   HCT 42.5 36.0 - 46.0 %   MCV 87.3 78.0 - 100.0 fL   MCH 28.3 26.0 - 34.0 pg   MCHC 32.5 30.0 - 36.0 g/dL   RDW 13.1 11.5 - 15.5 %   Platelets 304 150 - 400 K/uL   Neutrophils Relative % 57 %   Neutro Abs 2.6 1.7 - 7.7 K/uL   Lymphocytes Relative 32 %   Lymphs Abs 1.4 0.7 - 4.0 K/uL   Monocytes Relative 8 %   Monocytes Absolute 0.4 0.1 - 1.0 K/uL   Eosinophils Relative 2 %   Eosinophils Absolute 0.1 0.0 - 0.7 K/uL   Basophils Relative 1 %   Basophils Absolute 0.0 0.0 - 0.1 K/uL  Comprehensive metabolic panel     Status: Abnormal   Collection Time: 03/26/15  2:04 PM  Result Value Ref Range   Sodium 142 135 - 145 mmol/L   Potassium 3.3 (L) 3.5 - 5.1 mmol/L   Chloride 106 101 - 111 mmol/L   CO2 27 22 - 32 mmol/L   Glucose, Bld 80 65 - 99 mg/dL   BUN 18 6 - 20 mg/dL   Creatinine, Ser 0.96 0.44 - 1.00 mg/dL   Calcium 9.4 8.9 - 10.3 mg/dL   Total Protein 6.7 6.5 - 8.1 g/dL   Albumin 4.0 3.5 - 5.0 g/dL   AST 18 15 - 41 U/L   ALT 13 (L) 14 - 54 U/L   Alkaline Phosphatase 66 38 - 126 U/L   Total Bilirubin 0.5 0.3 - 1.2 mg/dL   GFR calc non Af Amer >60 >60 mL/min   GFR calc Af Amer >60 >60 mL/min    Comment: (NOTE) The eGFR has been calculated using the CKD EPI equation. This calculation has not been validated in all clinical situations. eGFR's persistently <60 mL/min signify possible Chronic Kidney Disease.    Anion gap 9 5 - 15  Protime-INR      Status: None   Collection Time: 03/26/15  2:04 PM  Result Value Ref Range   Prothrombin Time 13.1 11.6 - 15.2 seconds   INR 0.97 0.00 - 1.49  Type and screen     Status: None   Collection Time: 03/26/15  2:07 PM  Result Value Ref Range   ABO/RH(D) O POS    Antibody Screen NEG    Sample Expiration 04/09/2015   Cytology - PAP     Status: None   Collection Time: 03/31/15 12:00 AM  Result Value Ref Range   CYTOLOGY - PAP PAP RESULT     Estimated body mass index is 24.71 kg/(m^2) as calculated from the following:   Height as of 03/26/15: 5' 6" (1.676 m).   Weight as of this encounter: 153 lb (69.4 kg).  Imaging Review Plain radiographs demonstrate severe degenerative joint disease of the right knee(s). The overall alignment is mild valgus.There is evidence of loosening of the patellar components. The bone quality appears to be fair for age and reported activity level. There is Lateral bone-on-bone change in the setting of previous patellofemoral arthroplasty.  Assessment/Plan:  End stage arthritis, right knee(s) with failed previous arthroplasty.   The patient history, physical examination, clinical judgment of the provider and imaging studies are consistent with end stage degenerative joint disease of the right knee(s), previous total knee arthroplasty. Revision total knee arthroplasty is deemed medically necessary. The treatment options including medical management, injection therapy, arthroscopy and revision arthroplasty were discussed at length. The risks and benefits of revision   total knee arthroplasty were presented and reviewed. The risks due to aseptic loosening, infection, stiffness, patella tracking problems, thromboembolic complications and other imponderables were discussed. The patient acknowledged the explanation, agreed to proceed with the plan and consent was signed. Patient is being admitted for inpatient treatment for surgery, pain control, PT, OT, prophylactic antibiotics,  VTE prophylaxis, progressive ambulation and ADL's and discharge planning.The patient is planning to be discharged home with home health services

## 2015-04-05 NOTE — Progress Notes (Signed)
Orthopedic Tech Progress Note Patient Details:  [name] 02/19/1956 [id number]  CPM Right Knee CPM Right Knee: On Right Knee Flexion (Degrees): 70 Right Knee Extension (Degrees): 0 Additional Comments: Trapeze bar and foot roll   [name] 04/05/2015, 3:45 PM

## 2015-04-05 NOTE — Brief Op Note (Signed)
04/05/2015  2:57 PM  PATIENT:  [name]  59 y.o. female  PRE-OPERATIVE DIAGNOSIS:  DEGENERATIVE JOINT DISEASE S/P PAT/FEM REPLACEMENT RIGHT KNEE  POST-OPERATIVE DIAGNOSIS:  DEGENERATIVE JOINT DISEASE S/P PAT/FEM REPLACEMENT RIGHT KNEE  PROCEDURE:  Procedure(s): TOTAL KNEE REVISION (Right)  SURGEON:  Surgeon(s) and Role:    * Dorna Leitz, MD - Primary  PHYSICIAN ASSISTANT:   ASSISTANTS: bethune   ANESTHESIA:   general  EBL:  Total I/O In: 800 [I.V.:800] Out: 100 [Blood:100]  BLOOD ADMINISTERED:none  DRAINS: (1 med) Hemovact drain(s) in the r kneee with  Suction Open   LOCAL MEDICATIONS USED:  OTHER experel  SPECIMEN:  No Specimen  DISPOSITION OF SPECIMEN:  N/A  COUNTS:  YES  TOURNIQUET:   Total Tourniquet Time Documented: Thigh (Right) - 68 minutes Total: Thigh (Right) - 68 minutes   DICTATION: .Other Dictation: Dictation Number [id number]  PLAN OF CARE: Admit to inpatient   PATIENT DISPOSITION:  PACU - hemodynamically stable.   Delay start of Pharmacological VTE agent (>24hrs) due to surgical blood loss or risk of bleeding: no

## 2015-04-05 NOTE — Transfer of Care (Signed)
Immediate Anesthesia Transfer of Care Note  Patient: [name]  Procedure(s) Performed: Procedure(s): TOTAL KNEE REVISION (Right)  Patient Location: PACU  Anesthesia Type:General  Level of Consciousness: awake, alert  and oriented  Airway & Oxygen Therapy: Patient Spontanous Breathing and Patient connected to nasal cannula oxygen  Post-op Assessment: Report given to RN and Post -op Vital signs reviewed and stable  Post vital signs: Reviewed and stable  Last Vitals:  Filed Vitals:   04/05/15 1505  BP: 149/89  Pulse:   Temp: 36.9 C  Resp: 12    Complications: No apparent anesthesia complications

## 2015-04-05 NOTE — Anesthesia Postprocedure Evaluation (Signed)
  Anesthesia Post-op Note  Patient: [name]  Procedure(s) Performed: Procedure(s): TOTAL KNEE REVISION (Right)  Patient Location: PACU  Anesthesia Type:General  Level of Consciousness: awake and alert   Airway and Oxygen Therapy: Patient Spontanous Breathing and Patient connected to nasal cannula oxygen  Post-op Pain: moderate  Post-op Assessment: Post-op Vital signs reviewed and Patient's Cardiovascular Status Stable              Post-op Vital Signs: Reviewed and stable  Last Vitals:  Filed Vitals:   04/05/15 1700  BP: 121/63  Pulse: 79  Temp:   Resp: 12    Complications: No apparent anesthesia complications

## 2015-04-06 ENCOUNTER — Encounter (HOSPITAL_COMMUNITY): Payer: Self-pay | Admitting: General Practice

## 2015-04-06 LAB — BASIC METABOLIC PANEL
Anion gap: 6 (ref 5–15)
BUN: 14 mg/dL (ref 6–20)
CALCIUM: 8.5 mg/dL — AB (ref 8.9–10.3)
CO2: 23 mmol/L (ref 22–32)
Chloride: 108 mmol/L (ref 101–111)
Creatinine, Ser: 0.84 mg/dL (ref 0.44–1.00)
GFR calc Af Amer: >60 mL/min (ref >60)
GLUCOSE: 174 mg/dL — AB (ref 65–99)
Potassium: 3.7 mmol/L (ref 3.5–5.1)
SODIUM: 137 mmol/L (ref 135–145)

## 2015-04-06 LAB — CBC
HCT: 36.2 % (ref 36.0–46.0)
Hemoglobin: 11.7 g/dL — ABNORMAL LOW (ref 12.0–15.0)
MCH: 28.3 pg (ref 26.0–34.0)
MCHC: 32.3 g/dL (ref 30.0–36.0)
MCV: 87.4 fL (ref 78.0–100.0)
PLATELETS: 282 10*3/uL (ref 150–400)
RBC: 4.14 MIL/uL (ref 3.87–5.11)
RDW: 13.1 % (ref 11.5–15.5)
WBC: 9.7 10*3/uL (ref 4.0–10.5)

## 2015-04-06 NOTE — Op Note (Signed)
NAMEJACQUETTA, [name]              ACCOUNT NO.:  [id number]  MEDICAL RECORD NO.:  [id number]  LOCATION:  5N10C                        FACILITY:  [facility]  PHYSICIAN:  Alta Corning, M.D.   DATE OF BIRTH:  [birth year]  DATE OF PROCEDURE:  04/05/2015 DATE OF DISCHARGE:                              OPERATIVE REPORT   PREOPERATIVE DIAGNOSIS:  End-stage degenerative joint disease, right knee, status post previous patellofemoral replacement.  POSTOPERATIVE DIAGNOSIS:  End-stage degenerative joint disease, right knee, status post previous patellofemoral replacement.  PRINCIPAL PROCEDURE:  Right total knee revision of previous patellofemoral arthroplasty.  SURGEON:  Alta Corning, M.D.  ASSISTANT:  Gary Fleet, P.A.  ANESTHESIA:  General.  BRIEF HISTORY:  [name] is a 59 year old female with history of significant complaints of right knee pain.  She has had a patellofemoral arthroplasty 20 years ago.  She was having significant complaints of pain.  X-ray showed severe bone-on-bone change and after failure of conservative care, she was taken to the operating room for right total knee replacement.  DESCRIPTION OF PROCEDURE:  The patient was taken to the operating room, after adequate anesthesia was obtained with general anesthetic, the patient was placed supine on the operating table.  The right leg was prepped and draped in usual sterile fashion.  Following this, the leg was exsanguinated and blood pressure tourniquet was inflated to 300 mmHg.  Following this, a midline incision was made with subcutaneous tissue down the level of extensor mechanism and a medial parapatellar arthrotomy was undertaken.  Once this was done, attention was turned to the old patellofemoral arthroplasty, where the screws were removed and the arthroplasty itself was began using Bromley instruments and working around the size.  It was somewhat loose and came out.  There was certainly some pressure on  the bone around this and there was a stress shielding on the bone around it.  The patellar button had delamination of the articular surface and we knew that it was going to have to be revised.  At this time, attention was turned to the femur where an intramedullary pilot hole was drilled with a 5-degree valgus inclination.  We then did a distal femoral cut.  We then did anterior and posterior cuts, chamfers and box, 2.5 was the right size.  Attention was turned to the tibia where cut was made perpendicular to the long axis and once that was completed, the attention was turned towards placing a tibial component that was sized to a 3, it was drilled and keeled.  Attention was then turned towards trial components size 3 tibia, size 2.5 femur, 10-mm bridging bearing trial was placed and attention was then turned to the patella.  It was cut down to a level of 13 mm and the lugs were drilled, kind of in the position of the old patella, we had just basically sought out the patella and then freehand, cut it and then put a paddle on and then kind a came up very close to where the old drills were and so we drilled out that poly.  We then put a trial poly in place, put it through a range of motion, excellent stability and range of  motion were achieved.  She did have that history on the left side of having had some hyperlaxity and we felt that we really wanted to set her up pretty snug on this side.  So, at this point, we removed all the trial components, copiously irrigated, suctioned, dried, cemented and the final component size 2.5 femur, size 3 tibia, used a 10-mm bridging bearing trail to allow the cement to dry and put a 35 all poly patella, held with a clamp.  All excess bone cement was removed.  Cement was allowed to completely harden.  A 20 mL of Exparel and 20 mL of Marcaine were instilled all around the knee for postoperative anesthesia and then, medium Hemovac drain was placed. Tourniquet  was let down.  We controlled all bleeding with electrocautery.  We irrigated the knee again thoroughly.  Trialed a 12.5, that little snug in extension, but good in flexion and given her history, we thought that was the right poly.  So, we pulled the 12.5 poly and placed that and we were happy with the balance at this point. The knee was copiously irrigated and suctioned dry.  The medial parapatellar arthrotomy was closed with 1 Vicryl running, the skin with 0 and 2-0 Vicryl and skin staples.  Sterile compressive dressing was applied.  The patient was taken to the recovery, she was noted to be in satisfactory condition.  Estimated blood loss for the procedure was minimal.     Alta Corning, M.D.     Corliss Skains  D:  04/05/2015  T:  04/06/2015  Job:  [id number]

## 2015-04-06 NOTE — Care Management (Signed)
Utilization review completed. Jermale Crass, RN Case Manager [phone]. 

## 2015-04-06 NOTE — Evaluation (Signed)
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 Pt will benefit from skilled PT to increase their independence and safety with mobility to allow discharge to home with family assistance. Anticipate review of HEP and stairs in afternoon session.      Follow Up Recommendations Home health PT;Supervision for mobility/OOB    Equipment Recommendations  None recommended by PT;Other (comment) (reports having rw at home. )    Recommendations for Other Services       Precautions / Restrictions Precautions Precautions: Knee;Fall Precaution Booklet Issued: Yes (comment) Precaution Comments: HEP Restrictions Weight Bearing Restrictions: Yes RLE Weight Bearing: Weight bearing as tolerated      Mobility  Bed Mobility Overal bed mobility: Modified Independent             General bed mobility comments: supine<>sit  Transfers Overall transfer level: Needs assistance Equipment used: Rolling walker (2 wheeled) Transfers: Sit to/from Stand Sit to Stand: Supervision         General transfer comment: reminder for hand postions  Ambulation/Gait Ambulation/Gait assistance: Supervision Ambulation Distance (Feet): 150 Feet Assistive device: Rolling walker (2 wheeled) Gait Pattern/deviations: Step-through pattern Gait velocity: decreased   General Gait Details: steady pattern  Stairs            Wheelchair Mobility    Modified Rankin (Stroke Patients Only)       Balance Overall balance assessment: Needs assistance Sitting-balance support: No upper extremity supported Sitting balance-Leahy Scale: Good     Standing balance support: During functional activity Standing balance-Leahy Scale:  Fair Standing balance comment: using rw                             Pertinent Vitals/Pain Pain Assessment: 0-10 Pain Score: 2  Pain Location: Lt knee Pain Descriptors / Indicators: Sore Pain Intervention(s): Limited activity within patient's tolerance;Monitored during session    Home Living Family/patient expects to be discharged to:: Private residence Living Arrangements: Spouse/significant other Available Help at Discharge: Family Type of Home: House Home Access: Stairs to enter Entrance Stairs-Rails: Right Entrance Stairs-Number of Steps: 5 Home Layout: One level Home Equipment: Environmental consultant - 2 wheels      Prior Function Level of Independence: Independent               Hand Dominance        Extremity/Trunk Assessment               Lower Extremity Assessment: RLE deficits/detail RLE Deficits / Details: independent SLR       Communication   Communication: No difficulties  Cognition Arousal/Alertness: Awake/alert Behavior During Therapy: WFL for tasks assessed/performed Overall Cognitive Status: Within Functional Limits for tasks assessed                      General Comments      Exercises Total Joint Exercises Ankle Circles/Pumps: AROM;Both;15 reps Quad Sets: Strengthening;Right;10 reps Short Arc Quad: Strengthening;Right;10 reps Heel Slides: AAROM;Right;10 reps      Assessment/Plan    PT Assessment Patient needs continued PT services  PT Diagnosis Difficulty walking;Generalized weakness;Acute pain   PT Problem List Decreased strength;Decreased range of motion;Decreased activity tolerance;Decreased balance;Decreased mobility;Pain  PT Treatment Interventions  PT Goals (Current goals can be found in the Care Plan section) Acute Rehab PT Goals Patient Stated Goal: go home today PT Goal Formulation: With patient Time For Goal Achievement: 04/20/15 Potential to Achieve Goals: Good    Frequency 7X/week   Barriers to  discharge        Co-evaluation               End of Session Equipment Utilized During Treatment: Gait belt Activity Tolerance: Patient tolerated treatment well Patient left: in bed;with call bell/phone within reach;Other (comment) (in knee extension) Nurse Communication: Mobility status         Time: 1019-1050 PT Time Calculation (min) (ACUTE ONLY): 31 min   Charges:   PT Evaluation $Initial PT Evaluation Tier I: 1 Procedure PT Treatments $Gait Training: 8-22 mins   PT G Codes:        Cassell Clement, PT, CSCS Pager [phone] Office [phone]  04/06/2015, 12:37 PM

## 2015-04-06 NOTE — Discharge Summary (Signed)
Patient ID: [name] MRN: [id number] DOB/AGE: 1956/06/18 59 y.o.  Admit date: 04/05/2015 Discharge date: 04/06/2015  Admission Diagnoses:  Principal Problem:   Primary osteoarthritis of right knee Active Problems:   Pain due to knee joint prosthesis [facility])   Discharge Diagnoses:  Same  Past Medical History  Diagnosis Date  . Hyperlipidemia     takes Simvastatin nightly  . Seasonal allergies     in spring  . History of migraine     last one was on 06/02/12;takes Topamax bid and Imitrex prn  . Arthritis   . Joint pain   . Joint swelling   . Insomnia     takes Trazodone nightly  . Headache(784.0)   . Cancer [facility]) 1978     cervical cancer    Surgeries: Procedure(s): Right TOTAL KNEE REVISION on 04/05/2015    Discharged Condition: Improved  Hospital Course: [name] is an 59 y.o. female who was admitted 04/05/2015 for operative treatment ofPrimary osteoarthritis of right knee. She had a history of a patellofemoral replacement of the right knee approximate 20 years ago. Preoperative x-rays showed bone-on-bone changes in the medial and lateral compartments of the knee. She failed nonoperative treatment including injection therapy, medication, physical therapy, and modification of activity. She was to be admitted for right total knee revision arthroplasty. Patient has severe unremitting pain that affects sleep, daily activities, and work/hobbies. After pre-op clearance the patient was taken to the operating room on 04/05/2015 and underwent  Procedure(s): Right TOTAL KNEE REVISION.    Patient was given perioperative antibiotics: Anti-infectives    Start     Dose/Rate Route Frequency Ordered Stop   04/05/15 2000  ceFAZolin (ANCEF) IVPB 2 g/50 mL premix     2 g 100 mL/hr over 30 Minutes Intravenous Every 6 hours 04/05/15 1850 04/06/15 0304   04/05/15 1420  cefUROXime (ZINACEF) injection  Status:  Discontinued       As needed 04/05/15 1421 04/05/15 1500   04/05/15 1130   ceFAZolin (ANCEF) IVPB 2 g/50 mL premix     2 g 100 mL/hr over 30 Minutes Intravenous To [facility]Stay [facility] 04/04/15 1150 04/05/15 1310       Patient was given sequential compression devices, early ambulation, and chemoprophylaxis to prevent DVT.  Patient benefited maximally from hospital stay and there were no complications.    Recent vital signs: Patient Vitals for the past 24 hrs:  BP Temp Temp src Pulse Resp SpO2 Weight  04/06/15 0411 (!) 111/50 mmHg 98 F (36.7 C) Oral 72 20 98 % -  04/05/15 2343 (!) 117/56 mmHg 97.5 F (36.4 C) Oral 82 20 98 % -  04/05/15 1851 (!) 111/59 mmHg 98 F (36.7 C) - 74 16 99 % -  04/05/15 1800 122/61 mmHg 97.7 F (36.5 C) - - - - -  04/05/15 1700 121/63 mmHg - - 79 12 100 % -  04/05/15 1645 119/66 mmHg - - 66 10 100 % -  04/05/15 1630 125/64 mmHg - - 78 12 100 % -  04/05/15 1615 116/68 mmHg - - 76 12 99 % -  04/05/15 1600 (!) 126/55 mmHg - - 72 14 98 % -  04/05/15 1545 139/79 mmHg - - 93 14 100 % -  04/05/15 1530 136/78 mmHg - - 90 12 100 % -  04/05/15 1515 (!) 139/94 mmHg - - (!) 105 19 99 % -  04/05/15 1505 (!) 149/89 mmHg 98.4 F (36.9 C) - - 12 - -  04/05/15 1235 (!) 116/48 mmHg - - 92 15 100 % -  04/05/15 1230 (!) 113/92 mmHg - - 88 14 99 % -  04/05/15 1225 (!) 112/41 mmHg - - 91 19 98 % -  04/05/15 1220 (!) 95/47 mmHg - - 84 12 98 % -  04/05/15 1215 (!) 104/49 mmHg - - 85 15 99 % -  04/05/15 1210 (!) 112/53 mmHg - - 73 17 99 % -  04/05/15 1205 (!) 88/34 mmHg - - 72 18 100 % -  04/05/15 1200 115/71 mmHg - - 68 17 100 % -  04/05/15 1120 136/70 mmHg - - - - - -  04/05/15 1115 131/75 mmHg - - 66 19 100 % -  04/05/15 1040 (!) 145/76 mmHg 97.8 F (36.6 C) Oral 72 20 100 % 69.4 kg (153 lb)     Recent laboratory studies:  Recent Labs  04/06/15 0534  WBC 9.7  HGB 11.7*  HCT 36.2  PLT 282  NA 137  K 3.7  CL 108  CO2 23  BUN 14  CREATININE 0.84  GLUCOSE 174*  CALCIUM 8.5*     Discharge Medications:     Medication List     STOP taking these medications        HYDROcodone-acetaminophen 5-325 MG tablet  Commonly known as:  NORCO/VICODIN      TAKE these medications        aspirin EC 325 MG tablet  Take 1 tablet (325 mg total) by mouth 2 (two) times daily after a meal. Take x 1 month post op to decrease risk of blood clots.     oxyCODONE-acetaminophen 5-325 MG tablet  Commonly known as:  PERCOCET/ROXICET  Take 1-2 tablets by mouth every 4 (four) hours as needed for severe pain.     SUMAtriptan 100 MG tablet  Commonly known as:  IMITREX  Take 100 mg by mouth every 2 (two) hours as needed for migraine.     tiZANidine 2 MG tablet  Commonly known as:  ZANAFLEX  Take 1 tablet (2 mg total) by mouth every 6 (six) hours as needed for muscle spasms.     topiramate 50 MG tablet  Commonly known as:  TOPAMAX  Take 50 mg by mouth 2 (two) times daily.     traMADol 200 MG 24 hr tablet  Commonly known as:  ULTRAM-ER  Take 200 mg by mouth 2 (two) times daily as needed for pain.     traZODone 50 MG tablet  Commonly known as:  DESYREL  Take 100 mg by mouth at bedtime.        Diagnostic Studies: Dg Chest 2 View  03/26/2015   CLINICAL DATA:  Preoperative respiratory evaluation prior to right total knee arthroplasty revision.  EXAM: CHEST  2 VIEW  COMPARISON:  03/21/2013 and earlier.  FINDINGS: Cardiomediastinal silhouette unremarkable, unchanged. Lungs clear. Bronchovascular markings normal. Pulmonary vascularity normal. No visible pleural effusions. No pneumothorax. Visualized bony thorax intact. No change dating back to 2008.  IMPRESSION: No acute cardiopulmonary disease.  Stable examination.   Electronically Signed   By: Evangeline Dakin M.D.   On: 03/26/2015 15:33    Disposition: 06-Home-Health Care Svc      Discharge Instructions    Call MD / Call 911    Complete by:  As directed   If you experience chest pain or shortness of breath, CALL 911 and be transported to the hospital emergency room.  If you  develope a fever above 101  F, pus (white drainage) or increased drainage or redness at the wound, or calf pain, call your surgeon's office.     Constipation Prevention    Complete by:  As directed   Drink plenty of fluids.  Prune juice may be helpful.  You may use a stool softener, such as Colace (over the counter) 100 mg twice a day.  Use MiraLax (over the counter) for constipation as needed.     Diet general    Complete by:  As directed      Do not put a pillow under the knee. Place it under the heel.    Complete by:  As directed      Increase activity slowly as tolerated    Complete by:  As directed      Weight bearing as tolerated    Complete by:  As directed   Laterality:  right  Extremity:  Lower           Follow-up Information    Follow up with GRAVES,JOHN L, MD. Schedule an appointment as soon as possible for a visit in 17 days.   Specialty:  Orthopedic Surgery   Contact information:   [place] [address] [phone]        Signed: Erlene Senters 04/06/2015, 9:45 AM

## 2015-04-06 NOTE — Care Management Note (Signed)
Case Management Note  Patient Details  Name: [name] MRN: [id number] Date of Birth: 01/31/1956  Subjective/Objective: 59 yr old female s/p right total knee revision.    Action/Plan: Case manager spoke with patient concerning home health and DME needs at discharge. Choice was offered. Patient states she used [facility] previously and wants to now. Referral was called to [facility], [facility].  Patient has rolling walker, 3in1. CPM has been delivered by [employer]. Patient will have assistance at discharge.  Expected Discharge Date:   04/06/15               Expected Discharge Plan:  [place]  In-House Referral:  NA  Discharge planning Services  CM Consult  Post Acute Care Choice:  Home Health Choice offered to:  Patient  DME Arranged:  [facility] DME Agency:  [employer]  HH Arranged:  [facility] [place] Agency:  [facility]  Status of Service:  Completed, signed off  Medicare Important Message Given:    Date Medicare IM Given:    Medicare IM give by:    Date Additional Medicare IM Given:    Additional Medicare Important Message give by:     If discussed at [place] of Stay Meetings, dates discussed:    Additional Comments:  Ninfa Meeker, RN 04/06/2015, 1:54 PM

## 2015-04-06 NOTE — Progress Notes (Signed)
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  From a mobility standpoint anticipate patient will be ready for DC home with family assistance. Patient confirms that she feels confident with going home. Patient denies any questions or concerns.       Follow Up Recommendations  Home health PT;Supervision for mobility/OOB     Equipment Recommendations  None recommended by PT;Other (comment)    Recommendations for Other Services       Precautions / Restrictions Precautions Precautions: Knee Precaution Booklet Issued: Yes (comment) (HEP reviewed and provided) Precaution Comments: HEP Restrictions Weight Bearing Restrictions: Yes RLE Weight Bearing: Weight bearing as tolerated    Mobility  Bed Mobility Overal bed mobility: Modified Independent             General bed mobility comments: supine<>sit  Transfers Overall transfer level: Needs assistance Equipment used: Rolling walker (2 wheeled) Transfers: Sit to/from Stand Sit to Stand: Supervision         General transfer comment: no cues needed.   Ambulation/Gait Ambulation/Gait assistance: Supervision Ambulation Distance (Feet): 150 Feet Assistive device: Rolling walker (2 wheeled) Gait Pattern/deviations: Step-through pattern Gait velocity: decreased   General Gait Details: steady pattern, even stride length   Stairs Stairs: Yes Stairs assistance: Supervision Stair Management: One rail Right;Step to pattern Number of Stairs: 5 General stair comments: Patient reports feeling confident with stairs and ability to perform at home.   Wheelchair Mobility    Modified Rankin (Stroke Patients Only)       Balance Overall balance assessment: Needs assistance Sitting-balance  support: No upper extremity supported Sitting balance-Leahy Scale: Good     Standing balance support: During functional activity Standing balance-Leahy Scale: Fair Standing balance comment: using rw                    Cognition Arousal/Alertness: Awake/alert Behavior During Therapy: WFL for tasks assessed/performed Overall Cognitive Status: Within Functional Limits for tasks assessed                      Exercises Total Joint Exercises Ankle Circles/Pumps: AROM;Both;15 reps Quad Sets: Strengthening;Right;10 reps Short Arc Quad: Strengthening;Right;10 reps Heel Slides: AAROM;Right;10 reps Hip ABduction/ADduction: Strengthening;Right;10 reps Straight Leg Raises: Right;Strengthening;5 reps Goniometric ROM: 113    General Comments        Pertinent Vitals/Pain Pain Assessment: 0-10 Pain Score: 2  Pain Location: Lt knee Pain Descriptors / Indicators: Sore Pain Intervention(s): Monitored during session;Limited activity within patient's tolerance                     PT Goals (current goals can now be found in the care plan section) Acute Rehab PT Goals Patient Stated Goal: go home today PT Goal Formulation: With patient Time For Goal Achievement: 04/20/15 Potential to Achieve Goals: Good Progress towards PT goals: Progressing toward goals    Frequency  7X/week    PT Plan Current plan remains appropriate    Co-evaluation             End of Session Equipment Utilized During Treatment: Gait belt Activity Tolerance: Patient tolerated treatment well Patient left: in chair;with call bell/phone within reach;Other (comment) (in knee extension)     Time: 8242-3536 PT Time Calculation (min) (ACUTE ONLY): 35  min  Charges:  $Gait Training: 8-22 mins $Therapeutic Exercise: 8-22 mins                    G Codes:      Cassell Clement, PT, CSCS Pager [phone] Office [phone]  04/06/2015, 3:49 PM

## 2015-04-06 NOTE — Progress Notes (Signed)
Irven Easterly discharged home per MD order. Discharge instructions reviewed and discussed with patient. All questions and concerns answered. Copy of instructions and scripts given to patient. IV removed.  Patient escorted to car by staff in a wheelchair. No distress noted upon discharge.   Tarri Abernethy R 04/06/2015 3:06 PM

## 2015-04-06 NOTE — Progress Notes (Signed)
Subjective: 1 Day Post-Op Procedure(s) (LRB): TOTAL KNEE REVISION (Right) Patient reports pain as mild. The patient states "I want to go home " She is taking by mouth and voiding okay.   Objective: Vital signs in last 24 hours: Temp:  [97.5 F (36.4 C)-98.4 F (36.9 C)] 98 F (36.7 C) (10/04 0411) Pulse Rate:  [66-105] 72 (10/04 0411) Resp:  [10-20] 20 (10/04 0411) BP: (88-149)/(34-94) 111/50 mmHg (10/04 0411) SpO2:  [98 %-100 %] 98 % (10/04 0411) Weight:  [69.4 kg (153 lb)] 69.4 kg (153 lb) (10/03 1040)  Intake/Output from previous day: 10/03 0701 - 10/04 0700 In: 900 [I.V.:800; IV Piggyback:100] Out: 825 [Urine:450; Drains:275; Blood:100] Intake/Output this shift:     Recent Labs  04/06/15 0534  HGB 11.7*    Recent Labs  04/06/15 0534  WBC 9.7  RBC 4.14  HCT 36.2  PLT 282    Recent Labs  04/06/15 0534  NA 137  K 3.7  CL 108  CO2 23  BUN 14  CREATININE 0.84  GLUCOSE 174*  CALCIUM 8.5*   No results for input(s): LABPT, INR in the last 72 hours. Right knee exam: Neurologically intact Neurovascular intact Sensation intact distally Intact pulses distally Dorsiflexion/Plantar flexion intact Incision: dressing C/D/I Compartment soft Hemovac drain intact. Range of motion 0 to 120. Assessment/Plan: 1 Day Post-Op Procedure(s) (LRB): TOTAL KNEE REVISION (Right) Plan: Hemovac drain pulled. I will have her nurse change the exterior part of her dressing. She will leave the Aquacel in place. Discharge home today after physical therapy as long as she passes PT. Prescriptions written. Follow-up with Dr. Berenice Primas in the office in 2-1/2 weeks.   Gradie Ohm G 04/06/2015, 9:39 AM

## 2015-04-26 ENCOUNTER — Ambulatory Visit (HOSPITAL_COMMUNITY): Payer: Medicare Other | Attending: Orthopedic Surgery | Admitting: Physical Therapy

## 2015-04-26 DIAGNOSIS — M25661 Stiffness of right knee, not elsewhere classified: Secondary | ICD-10-CM | POA: Diagnosis present

## 2015-04-26 DIAGNOSIS — R29898 Other symptoms and signs involving the musculoskeletal system: Secondary | ICD-10-CM

## 2015-04-26 DIAGNOSIS — M25562 Pain in left knee: Secondary | ICD-10-CM | POA: Diagnosis present

## 2015-04-26 DIAGNOSIS — M6281 Muscle weakness (generalized): Secondary | ICD-10-CM | POA: Diagnosis present

## 2015-04-26 DIAGNOSIS — M25662 Stiffness of left knee, not elsewhere classified: Secondary | ICD-10-CM | POA: Insufficient documentation

## 2015-04-26 DIAGNOSIS — R262 Difficulty in walking, not elsewhere classified: Secondary | ICD-10-CM | POA: Insufficient documentation

## 2015-04-26 DIAGNOSIS — Z96651 Presence of right artificial knee joint: Secondary | ICD-10-CM | POA: Insufficient documentation

## 2015-04-26 DIAGNOSIS — M25561 Pain in right knee: Secondary | ICD-10-CM | POA: Diagnosis present

## 2015-04-26 NOTE — Patient Instructions (Addendum)
Knee Extension (Sitting)    Place __0__ pound weight on left ankle and straighten knee fully, lower slowly. Repeat __10__ times per set. Do __1__ sets per session. Do __10__ sessions per day.  http://orth.exer.us/732   Copyright  VHI. All rights reserved.  Strengthening: Quadriceps Set    Tighten muscles on top of thighs by pushing knees down into surface. Hold _5___ seconds. Repeat __10__ times per set. Do _1___ sets per session. Do _10___ sessions per day.  http://orth.exer.us/602   Copyright  VHI. All rights reserved.  Self-Mobilization: Heel Slide (Supine)    Slide left heel toward buttocks until a gentle stretch is felt. Hold __5__ seconds. Relax. Repeat _10___ times per set. Do __1__ sets per session. Do __3__ sessions per day.  http://orth.exer.us/710   Copyright  VHI. All rights reserved.  Stretching: Hamstring (Supine)    Supporting right thigh behind knee, slowly straighten knee until stretch is felt in back of thigh. Hold _30___ seconds. Repeat __3__ times per set. Do __1__ sets per session. Do 3____ sessions per day.  http://orth.exer.us/656   Copyright  VHI. All rights reserved.  Strengthening: Terminal Knee Extension (Supine)    With right knee over bolster, straighten knee by tightening muscles on top of thigh. Keep bottom of knee on bolster. Repeat __10__ times per set. Do __1__ sets per session. Do _3___ sessions per day.  http://orth.exer.us/626   Copyright  VHI. All rights reserved.  Self-Mobilization: Knee Flexion (Prone)    Bring left heel toward buttocks as close as possible. Hold __3__ seconds. Relax. Repeat __10__ times per set. Do __1__ sets per session. Do __3__ sessions per day.  http://orth.exer.us/596   Copyright  VHI. All rights reserved.  Strengthening: Hip Extension (Prone)    Tighten muscles on front of left thigh, then lift leg ___2_ inches from surface, keeping knee locked. Repeat __10__ times per set. Do __1__  sets per session. Do __3__ sessions per day.  http://orth.exer.us/620   Copyright  VHI. All rights reserved.

## 2015-04-26 NOTE — Therapy (Signed)
[place], [place], [address] Phone: [phone]   Fax:  [phone]  Physical Therapy Evaluation  Patient Details  Name: MC HOLLEN MRN: [id number] Date of Birth: [birth year] Referring Provider: Dr. Berenice Primas  Encounter Date: 04/26/2015      PT End of Session - 04/26/15 1202    Visit Number 1   Number of Visits 12   Date for PT Re-Evaluation 05/26/15   Authorization Type BCBS/medicare    PT Start Time 1104   PT Stop Time 1200   PT Time Calculation (min) 56 min   Activity Tolerance Patient tolerated treatment well      Past Medical History  Diagnosis Date  . Hyperlipidemia     takes Simvastatin nightly  . Seasonal allergies     in spring  . History of migraine     last one was on 06/02/12;takes Topamax bid and Imitrex prn  . Arthritis   . Joint pain   . Joint swelling   . Insomnia     takes Trazodone nightly  . Headache(784.0)   . Cancer [facility]) 1978     cervical cancer    Past Surgical History  Procedure Laterality Date  . Cyst removed from right knee   1972  . Left knee surgery  1973  . Abdominal hysterectomy  1978  . Right knee arthroscopy  90's     x 3  . Right knee partial replacement  1991  . Bladder tacked  1999  . Breast surgery  2009    cyst removed from left breast  . Right wrist sugery  2008  . Scar tissue removed  2008  . Appendectomy  2008  . Right wrist surgery    . Left knee surgery  2012  . Left knee surgery  2012  . Right carpal tunnel    . Colonoscopy    . Artroplasty    . Total knee arthroplasty  06/12/2012    LEFT KNEE  . Total knee arthroplasty  06/12/2012    Procedure: TOTAL KNEE ARTHROPLASTY;  Surgeon: Ninetta Lights, MD;  Location: [place];  Service: Orthopedics;  Laterality: Left;  . Total knee revision Left 03/26/2013    Procedure: TOTAL KNEE REVISION- left;  Surgeon: Alta Corning, MD;  Location: [place];  Service: Orthopedics;  Laterality: Left;  . Total  knee arthroplasty Right 04/05/2015  . Total knee revision Right 04/05/2015    Procedure: TOTAL KNEE REVISION;  Surgeon: Dorna Leitz, MD;  Location: [place];  Service: Orthopedics;  Laterality: Right;    There were no vitals filed for this visit.  Visit Diagnosis:  Status post total right knee replacement  Stiffness of right knee  Pain in right knee  Weakness of right leg  Difficulty walking  Difficulty walking down step      Subjective Assessment - 04/26/15 1106    Subjective Ms. Lansky states that she had a right knee replacement on 04/05/2015; she was discharged on the 4th and referred to [facility].  She is now being referred to out-patient therapy to maximize her functional ability. She stopped her [place] on 04/23/2015.  She states the bend is not what it should be and she is having a lot of pain with it.  She has had pain in her knee since she was 15 and has been wearing a brace on her knee for the past 3 years.    Pertinent History pt had a cyst removed from  her cartilage when she was 15; they removed her cyst with all of her cartilage, she has had multiple surgeries on her knee.    How long can you sit comfortably? at the most ten minutes; becomes uncomfortable at five.    How long can you stand comfortably? less than five minutes    How long can you walk comfortably? walking with a cane outside; without inside.  The longest she has walked has been five minutes.    Patient Stated Goals less pain, better bend; to be able walk more better sleeping.( waking up every 3 hours.  unable to sleep past 4:00 o'clock.    Currently in Pain? Yes  with pain medication; worst 9/10; lowest 2/10    Pain Score 5    Pain Location Knee   Pain Orientation Right   Pain Descriptors / Indicators Aching   Pain Type Surgical pain   Pain Onset 1 to 4 weeks ago   Pain Frequency Constant   Aggravating Factors  activity.             [facility] PT Assessment - 04/26/15 1117    Assessment   Medical Diagnosis Rt  TKR   Referring Provider Dr. Berenice Primas   Onset Date/Surgical Date 04/05/15   Next MD Visit 05/12/2015   Prior Therapy HH   Precautions   Precautions None   Restrictions   Weight Bearing Restrictions No   Balance Screen   Has the patient fallen in the past 6 months No   Has the patient had a decrease in activity level because of a fear of falling?  No   Is the patient reluctant to leave their home because of a fear of falling?  No   Home Environment   Living Environment Private residence   Type of [place] to enter   Entrance Stairs-Number of Steps 6   Entrance Stairs-Rails Can reach both   [place] One level   Prior Function   Level of Independence Independent   Vocation Full time employment   Leisure quilt    Cognition   Overall Cognitive Status Within Functional Limits for tasks assessed   Observation/Other Assessments   Focus on Therapeutic Outcomes (FOTO)  36   Functional Tests   Functional tests Single leg stance   Single Leg Stance   Comments Rt 20";Lt 40"   ROM / Strength   AROM / PROM / Strength AROM;Strength   AROM   AROM Assessment Site Knee   Right/Left Knee Right   Right Knee Extension 13   Strength   Strength Assessment Site Hip;Knee;Ankle   Right/Left Hip Right   Right Hip Flexion 5/5   Right Hip Extension 3/5   Right Hip ABduction 4+/5   Right/Left Knee Right   Right Knee Flexion 5/5   Right Knee Extension 3/5   Right/Left Ankle Right   Right Ankle Dorsiflexion 5/5                   [facility] Adult PT Treatment/Exercise - 04/26/15 0001    Exercises   Exercises Knee/Hip   Knee/Hip Exercises: Stretches   Active Hamstring Stretch Right;2 reps;30 seconds   Active Hamstring Stretch Limitations supine   Knee/Hip Exercises: Standing   SLS x2 max 20    Knee/Hip Exercises: Seated   Long Arc Quad Strengthening;Right;10 reps   Knee/Hip Exercises: Supine   Quad Sets Strengthening;Right;10 reps   Heel Slides Right;10 reps    Terminal Knee  Extension Right;10 reps   Knee/Hip Exercises: Prone   Hamstring Curl 10 reps   Hip Extension 10 reps                PT Education - 04/26/15 1201    Education provided Yes   Education Details HEP   Person(s) Educated Patient   Methods Explanation;Handout;Tactile cues;Verbal cues   Comprehension Verbalized understanding;Returned demonstration          PT Short Term Goals - 04/26/15 1217    PT SHORT TERM GOAL #1   Title I HEP   Time 1   Period Weeks   Status New   PT SHORT TERM GOAL #2   Title ROM to improve to 4-110 to allow pt to be comfortable while sitting for 30 minutes.    Time 2   Period Weeks   Status New   PT SHORT TERM GOAL #3   Title quadricep strength to be 3+/5 to allow pt to be able to tolerate standing for 15 minutes to make a small meal.    Time 2   Period Weeks   Status New   PT SHORT TERM GOAL #4   Title Pt pain level to be no greater than a 5/10 in the past week    Time 2   Period Weeks   Status New   PT SHORT TERM GOAL #5   Title Pt to be walking for 15 minutes at a time for improved health habits.    Time 2   Period Weeks           PT Long Term Goals - 04/26/15 1220    PT LONG TERM GOAL #1   Title Pt to be I in advance HEP   Time 4   Period Weeks   PT LONG TERM GOAL #2   Title Pt ROM to be 2-115 to  allow pt to have a normalized gait and to be able to sit for an hour at a time with comfot to be able to dine   Time 4   Period Weeks   Status New   PT LONG TERM GOAL #3   Title Pt strength to be at least 4+/5 to be able to walk for 30 mintues for better health habits.    Time 4   Period Weeks   Status New   PT LONG TERM GOAL #4   Title Pt to be able to go up and down steps using one hand in a reciprical manner   Time 4   Period Weeks   Status New   PT LONG TERM GOAL #5   Title Pt pain to be no greater than a 3/10 to allow pt to sleep without waking from pain    Time 4   Period Weeks                Plan - 04/26/15 1203    Clinical Impression Statement Ms. Downen is a 59 yo female who has had a right TKR on 10/ 09/2014 and is currently being referred to out-patient physical therapy to maximize her activity level.  She currently is able to walk in the house without her cane; using the cane outside.  Her man concern at this time is her pain level as well as her inability to bend her knee.  Due to her pain and stiffness she is having difficulty sitting, walking, standing, stooping, squatting and sleeping.  She is being referred to and will benefit from skilled  physical therapy to address these issues maximize her functional ability and improve her quality of life.     Pt will benefit from skilled therapeutic intervention in order to improve on the following deficits Abnormal gait;Decreased balance;Decreased endurance;Decreased range of motion;Decreased strength;Difficulty walking;Hypomobility;Increased edema;Increased fascial restricitons;Pain   Rehab Potential Good   PT Frequency 3x / week   PT Duration 4 weeks   PT Treatment/Interventions ADLs/Self Care Home Management;Gait training;Stair training;Functional mobility training;Therapeutic activities;Therapeutic exercise;Balance training;Patient/family education;Passive range of motion;Manual techniques   PT Next Visit Plan Treatment at this time should concentrate on improving terminal extension strength and ROM.  Begin rockerboard, standing terminal extension and flexion, gentl PROM. prone contract relax for improved flexion, manual as needed.   PT Home Exercise Plan HEP    Consulted and Agree with Plan of Care Patient          G-Codes - 05-05-2015 10/11/1226    Functional Assessment Tool Used foto    Functional Limitation Mobility: Walking and moving around   Mobility: Walking and Moving Around Current Status [phone]) At least 60 percent but less than 80 percent impaired, limited or restricted   Mobility: Walking and Moving Around Goal Status  [phone]) At least 40 percent but less than 60 percent impaired, limited or restricted       Problem List Patient Active Problem List   Diagnosis Date Noted  . Primary osteoarthritis of right knee 04/05/2015  . Pain due to knee joint prosthesis ([place]) 04/05/2015  . Status post revision of total replacement of left knee 04/15/2013  . Pain due to total left knee replacement ([place]) 03/27/2013  . Muscle weakness (generalized) 07/02/2012  . Total knee replacement status 07/02/2012  . Difficulty in walking(719.7) 07/02/2012  . Stiffness of left knee 07/02/2012  . Pain in left knee 07/02/2012  Rayetta Humphrey, PT CLT [phone] 05/05/2015, 12:28 PM  [place] [address] [place], [place], [address] Phone: [phone]   Fax:  [phone]  Name: LEEAN AMEZCUA MRN: [id number] Date of Birth: 12-Nov-1955

## 2015-04-28 ENCOUNTER — Ambulatory Visit (HOSPITAL_COMMUNITY): Payer: Medicare Other | Admitting: Physical Therapy

## 2015-04-28 DIAGNOSIS — M25661 Stiffness of right knee, not elsewhere classified: Secondary | ICD-10-CM

## 2015-04-28 DIAGNOSIS — Z96651 Presence of right artificial knee joint: Secondary | ICD-10-CM

## 2015-04-28 DIAGNOSIS — M25561 Pain in right knee: Secondary | ICD-10-CM

## 2015-04-28 DIAGNOSIS — R29898 Other symptoms and signs involving the musculoskeletal system: Secondary | ICD-10-CM

## 2015-04-28 DIAGNOSIS — R262 Difficulty in walking, not elsewhere classified: Secondary | ICD-10-CM

## 2015-04-28 NOTE — Therapy (Signed)
[place], [place], [address] Phone: [phone]   Fax:  [phone]  Physical Therapy Treatment  Patient Details  Name: [name] MRN: [id number] Date of Birth: Dec 24, 1955 Referring Provider: Dr. Berenice Primas  Encounter Date: 04/28/2015      PT End of Session - 04/28/15 1732    Visit Number 2   Number of Visits 12   Date for PT Re-Evaluation 05/26/15   Authorization Type BCBS/medicare    PT Start Time 1650   PT Stop Time 1735   PT Time Calculation (min) 45 min   Activity Tolerance Patient tolerated treatment well      Past Medical History  Diagnosis Date  . Hyperlipidemia     takes Simvastatin nightly  . Seasonal allergies     in spring  . History of migraine     last one was on 06/02/12;takes Topamax bid and Imitrex prn  . Arthritis   . Joint pain   . Joint swelling   . Insomnia     takes Trazodone nightly  . Headache(784.0)   . Cancer [facility]) 1978     cervical cancer    Past Surgical History  Procedure Laterality Date  . Cyst removed from right knee   1972  . Left knee surgery  1973  . Abdominal hysterectomy  1978  . Right knee arthroscopy  90's     x 3  . Right knee partial replacement  1991  . Bladder tacked  1999  . Breast surgery  2009    cyst removed from left breast  . Right wrist sugery  2008  . Scar tissue removed  2008  . Appendectomy  2008  . Right wrist surgery    . Left knee surgery  2012  . Left knee surgery  2012  . Right carpal tunnel    . Colonoscopy    . Artroplasty    . Total knee arthroplasty  06/12/2012    LEFT KNEE  . Total knee arthroplasty  06/12/2012    Procedure: TOTAL KNEE ARTHROPLASTY;  Surgeon: Ninetta Lights, MD;  Location: [place];  Service: Orthopedics;  Laterality: Left;  . Total knee revision Left 03/26/2013    Procedure: TOTAL KNEE REVISION- left;  Surgeon: Alta Corning, MD;  Location: [place];  Service: Orthopedics;  Laterality: Left;  . Total knee  arthroplasty Right 04/05/2015  . Total knee revision Right 04/05/2015    Procedure: TOTAL KNEE REVISION;  Surgeon: Dorna Leitz, MD;  Location: [place];  Service: Orthopedics;  Laterality: Right;    There were no vitals filed for this visit.  Visit Diagnosis:  Status post total right knee replacement  Stiffness of right knee  Pain in right knee  Weakness of right leg  Difficulty walking  Difficulty walking down step      Subjective Assessment - 04/28/15 1654    Subjective Pt states she is not sleeping good.  States she currently has pain of 6/10 today after taking a pain pill.  Reports complaince with HEP.    Pertinent History pt had a cyst removed from her cartilage when she was 15; they removed her cyst with all of her cartilage, she has had multiple surgeries on her knee. Ms. Bossard states that she had a right knee replacement on 04/05/2015; she was discharged on the 4th and referred to [facility].  She is now being referred to out-patient therapy to maximize her functional ability. She stopped her [facility]  on 04/23/2015.  She states the bend is not what it should be and she is having a lot of pain with it.  She has had pain in her knee since she was 15 and has been wearing a brace on her knee for the past 3 years.    Currently in Pain? Yes   Pain Score 6    Pain Location Knee   Pain Orientation Right   Pain Descriptors / Indicators Aching   Pain Type Surgical pain                         [facility] Adult PT Treatment/Exercise - 04/28/15 1655    Knee/Hip Exercises: Stretches   Active Hamstring Stretch Right;2 reps;30 seconds   Active Hamstring Stretch Limitations 12" step   Knee: Self-Stretch to increase Flexion Right;10 seconds   Knee: Self-Stretch Limitations 10 reps   Knee/Hip Exercises: Standing   Heel Raises 10 reps   Heel Raises Limitations toeraises 10 reps   Knee/Hip Exercises: Supine   Quad Sets Strengthening;Right;10 reps   Heel Slides Right;10 reps   Straight Leg  Raises Right;10 reps   Straight Leg Raises Limitations 20 degree extension lag   Knee Extension PROM   Knee Flexion PROM   Knee/Hip Exercises: Prone   Hamstring Curl 10 reps   Hip Extension 10 reps   Manual Therapy   Manual Therapy Soft tissue mobilization;Myofascial release   Manual therapy comments supine with elevation   Soft tissue mobilization to Rt knee   Myofascial Release to Rt knee                PT Education - 04/28/15 1731    Education provided Yes   Education Details copy of evaluation given with explanation of goals   Person(s) Educated Patient   Methods Explanation;Handout   Comprehension Verbalized understanding          PT Short Term Goals - 04/26/15 1217    PT SHORT TERM GOAL #1   Title I HEP   Time 1   Period Weeks   Status New   PT SHORT TERM GOAL #2   Title ROM to improve to 4-110 to allow pt to be comfortable while sitting for 30 minutes.    Time 2   Period Weeks   Status New   PT SHORT TERM GOAL #3   Title quadricep strength to be 3+/5 to allow pt to be able to tolerate standing for 15 minutes to make a small meal.    Time 2   Period Weeks   Status New   PT SHORT TERM GOAL #4   Title Pt pain level to be no greater than a 5/10 in the past week    Time 2   Period Weeks   Status New   PT SHORT TERM GOAL #5   Title Pt to be walking for 15 minutes at a time for improved health habits.    Time 2   Period Weeks           PT Long Term Goals - 04/26/15 1220    PT LONG TERM GOAL #1   Title Pt to be I in advance HEP   Time 4   Period Weeks   PT LONG TERM GOAL #2   Title Pt ROM to be 2-115 to  allow pt to have a normalized gait and to be able to sit for an hour at a time with comfot to be  able to dine   Time 4   Period Weeks   Status New   PT LONG TERM GOAL #3   Title Pt strength to be at least 4+/5 to be able to walk for 30 mintues for better health habits.    Time 4   Period Weeks   Status New   PT LONG TERM GOAL #4   Title  Pt to be able to go up and down steps using one hand in a reciprical manner   Time 4   Period Weeks   Status New   PT LONG TERM GOAL #5   Title Pt pain to be no greater than a 3/10 to allow pt to sleep without waking from pain    Time 4   Period Weeks               Plan - 04/28/15 1732    Clinical Impression Statement Initial evaluation given and explained.  Focused on HEP review, establishing stretches and general care for Rt LE.  Pt required therapist facilitiaton to complete therex in correct form.  Manual completed to Rt LE to decrease adhesions to medial and lateral knee. PT reported overall improvement at end of session.    PT Next Visit Plan Treatment at this time should concentrate on improving terminal extension strength and ROM.  Begin rockerboard, standing terminal extension and flexion, gentl PROM. prone contract relax for improved flexion,    Consulted and Agree with Plan of Care Patient        Problem List Patient Active Problem List   Diagnosis Date Noted  . Primary osteoarthritis of right knee 04/05/2015  . Pain due to knee joint prosthesis ([place]) 04/05/2015  . Status post revision of total replacement of left knee 04/15/2013  . Pain due to total left knee replacement ([place]) 03/27/2013  . Muscle weakness (generalized) 07/02/2012  . Total knee replacement status 07/02/2012  . Difficulty in walking(719.7) 07/02/2012  . Stiffness of left knee 07/02/2012  . Pain in left knee 07/02/2012    Teena Irani, PTA/CLT [phone]  04/28/2015, 5:35 PM  [place] [address] [place], [place], [address] Phone: [phone]   Fax:  [phone]  Name: [name] MRN: [id number] Date of Birth: Aug 29, 1955

## 2015-04-30 ENCOUNTER — Ambulatory Visit (HOSPITAL_COMMUNITY): Payer: Medicare Other

## 2015-04-30 DIAGNOSIS — M25562 Pain in left knee: Secondary | ICD-10-CM

## 2015-04-30 DIAGNOSIS — M25662 Stiffness of left knee, not elsewhere classified: Secondary | ICD-10-CM

## 2015-04-30 DIAGNOSIS — M25561 Pain in right knee: Secondary | ICD-10-CM

## 2015-04-30 DIAGNOSIS — Z96651 Presence of right artificial knee joint: Secondary | ICD-10-CM | POA: Diagnosis not present

## 2015-04-30 DIAGNOSIS — M25661 Stiffness of right knee, not elsewhere classified: Secondary | ICD-10-CM

## 2015-04-30 DIAGNOSIS — R262 Difficulty in walking, not elsewhere classified: Secondary | ICD-10-CM

## 2015-04-30 DIAGNOSIS — R29898 Other symptoms and signs involving the musculoskeletal system: Secondary | ICD-10-CM

## 2015-04-30 DIAGNOSIS — M6281 Muscle weakness (generalized): Secondary | ICD-10-CM

## 2015-04-30 NOTE — Therapy (Signed)
[place], [place], [address] Phone: [phone]   Fax:  [phone]  Physical Therapy Treatment  Patient Details  Name: [name] MRN: [id number] Date of Birth: 1956-05-16 Referring Provider: Dr. Berenice Primas  Encounter Date: 04/30/2015      PT End of Session - 04/30/15 1626    Visit Number 3   Number of Visits 12   Date for PT Re-Evaluation 05/26/15   Authorization Type BCBS/medicare    PT Start Time 1353   PT Stop Time 1428   PT Time Calculation (min) 35 min   Activity Tolerance Patient tolerated treatment well   Behavior During Therapy [facility] for tasks assessed/performed      Past Medical History  Diagnosis Date  . Hyperlipidemia     takes Simvastatin nightly  . Seasonal allergies     in spring  . History of migraine     last one was on 06/02/12;takes Topamax bid and Imitrex prn  . Arthritis   . Joint pain   . Joint swelling   . Insomnia     takes Trazodone nightly  . Headache(784.0)   . Cancer [facility]) 1978     cervical cancer    Past Surgical History  Procedure Laterality Date  . Cyst removed from right knee   1972  . Left knee surgery  1973  . Abdominal hysterectomy  1978  . Right knee arthroscopy  90's     x 3  . Right knee partial replacement  1991  . Bladder tacked  1999  . Breast surgery  2009    cyst removed from left breast  . Right wrist sugery  2008  . Scar tissue removed  2008  . Appendectomy  2008  . Right wrist surgery    . Left knee surgery  2012  . Left knee surgery  2012  . Right carpal tunnel    . Colonoscopy    . Artroplasty    . Total knee arthroplasty  06/12/2012    LEFT KNEE  . Total knee arthroplasty  06/12/2012    Procedure: TOTAL KNEE ARTHROPLASTY;  Surgeon: Ninetta Lights, MD;  Location: [place];  Service: Orthopedics;  Laterality: Left;  . Total knee revision Left 03/26/2013    Procedure: TOTAL KNEE REVISION- left;  Surgeon: Alta Corning, MD;  Location: [place];  Service: Orthopedics;  Laterality: Left;  . Total knee arthroplasty Right 04/05/2015  . Total knee revision Right 04/05/2015    Procedure: TOTAL KNEE REVISION;  Surgeon: Dorna Leitz, MD;  Location: [place];  Service: Orthopedics;  Laterality: Right;    There were no vitals filed for this visit.  Visit Diagnosis:  Stiffness of right knee  Pain in right knee  Weakness of right leg  Difficulty walking  Difficulty walking down step  Stiffness of left knee  Muscle weakness (generalized)  Pain in left knee                       [facility] Adult PT Treatment/Exercise - 04/30/15 0001    Knee/Hip Exercises: Stretches   Passive Hamstring Stretch 3 reps;60 seconds  Prone Hip at 90 degrees   Quad Stretch 3 reps;30 seconds  Prone hip in 90 degrees   Knee/Hip Exercises: Standing   Heel Raises 10 reps   Heel Raises Limitations toeraises 10 reps   Knee/Hip Exercises: Seated   Long Arc Quad Strengthening;Right;15 reps;2 sets   [employer]  Quad Weight 0 lbs.   Long Arc Quad Limitations VC to keep thigh down.    Hamstring Curl 2 sets;15 reps;Strengthening;Right  Red Tband tied to cow   Knee/Hip Exercises: Supine   Short Arc Quad Sets AAROM;Strengthening;Right;2 sets;15 reps   Manual Therapy   Manual Therapy Joint mobilization   Joint Mobilization 4x60sec tibfem posterior glide Gr IV  Patella mobs: med/lat, inf/sup 30x each                PT Education - 04/30/15 1625    Education provided Yes   Education Details Continue with active knee flexion in sitting to improve bending.    Person(s) Educated Patient   Methods Explanation   Comprehension Verbalized understanding          PT Short Term Goals - 04/26/15 1217    PT SHORT TERM GOAL #1   Title I HEP   Time 1   Period Weeks   Status New   PT SHORT TERM GOAL #2   Title ROM to improve to 4-110 to allow pt to be comfortable while sitting for 30 minutes.    Time 2   Period Weeks   Status New   PT SHORT  TERM GOAL #3   Title quadricep strength to be 3+/5 to allow pt to be able to tolerate standing for 15 minutes to make a small meal.    Time 2   Period Weeks   Status New   PT SHORT TERM GOAL #4   Title Pt pain level to be no greater than a 5/10 in the past week    Time 2   Period Weeks   Status New   PT SHORT TERM GOAL #5   Title Pt to be walking for 15 minutes at a time for improved health habits.    Time 2   Period Weeks           PT Long Term Goals - 04/26/15 1220    PT LONG TERM GOAL #1   Title Pt to be I in advance HEP   Time 4   Period Weeks   PT LONG TERM GOAL #2   Title Pt ROM to be 2-115 to  allow pt to have a normalized gait and to be able to sit for an hour at a time with comfot to be able to dine   Time 4   Period Weeks   Status New   PT LONG TERM GOAL #3   Title Pt strength to be at least 4+/5 to be able to walk for 30 mintues for better health habits.    Time 4   Period Weeks   Status New   PT LONG TERM GOAL #4   Title Pt to be able to go up and down steps using one hand in a reciprical manner   Time 4   Period Weeks   Status New   PT LONG TERM GOAL #5   Title Pt pain to be no greater than a 3/10 to allow pt to sleep without waking from pain    Time 4   Period Weeks               Plan - 04/30/15 1627    Clinical Impression Statement Pt making progress toward goals eviden tin HEP compliance, improved mobility, and strength in RLE. Pt continues to be most limited by ROM deficits in R knee, with pain associated with flexion. Joint mobility also remains quite limited.  Pt will benefit from skilled therapeutic intervention in order to improve on the following deficits Abnormal gait;Decreased balance;Decreased endurance;Decreased range of motion;Decreased strength;Difficulty walking;Hypomobility;Increased edema;Increased fascial restricitons;Pain   Rehab Potential Good   Clinical Impairments Affecting Rehab Potential 7 Surgeries total on this same  knee leading to chronic periods of weakness.    PT Frequency 3x / week   PT Duration 4 weeks   PT Treatment/Interventions ADLs/Self Care Home Management;Gait training;Stair training;Functional mobility training;Therapeutic activities;Therapeutic exercise;Balance training;Patient/family education;Passive range of motion;Manual techniques   PT Next Visit Plan Treatment at this time should concentrate on improving terminal extension strength and ROM.  Begin rockerboard, standing terminal extension and flexion, gentl PROM. prone contract relax for improved flexion,    PT Home Exercise Plan no changes this session   Consulted and Agree with Plan of Care Patient        Problem List Patient Active Problem List   Diagnosis Date Noted  . Primary osteoarthritis of right knee 04/05/2015  . Pain due to knee joint prosthesis ([place]) 04/05/2015  . Status post revision of total replacement of left knee 04/15/2013  . Pain due to total left knee replacement ([place]) 03/27/2013  . Muscle weakness (generalized) 07/02/2012  . Total knee replacement status 07/02/2012  . Difficulty in walking(719.7) 07/02/2012  . Stiffness of left knee 07/02/2012  . Pain in left knee 07/02/2012    Buccola,Allan C 04/30/2015, 4:30 PM  4:30 PM  Etta Grandchild, PT, DPT [place] License # [id number]       [facility] [address] [place], [place], [address] Phone: [phone]   Fax:  [phone]  Name: [name] MRN: [id number] Date of Birth: [birth year]

## 2015-05-03 ENCOUNTER — Ambulatory Visit (HOSPITAL_COMMUNITY): Payer: Medicare Other | Admitting: Physical Therapy

## 2015-05-03 DIAGNOSIS — R29898 Other symptoms and signs involving the musculoskeletal system: Secondary | ICD-10-CM

## 2015-05-03 DIAGNOSIS — M25561 Pain in right knee: Secondary | ICD-10-CM

## 2015-05-03 DIAGNOSIS — Z96651 Presence of right artificial knee joint: Secondary | ICD-10-CM | POA: Diagnosis not present

## 2015-05-03 DIAGNOSIS — M25661 Stiffness of right knee, not elsewhere classified: Secondary | ICD-10-CM

## 2015-05-03 DIAGNOSIS — R262 Difficulty in walking, not elsewhere classified: Secondary | ICD-10-CM

## 2015-05-03 NOTE — Therapy (Signed)
[place], [place], [address] Phone: [phone]   Fax:  [phone]  Physical Therapy Treatment  Patient Details  Name: [name] MRN: [id number] Date of Birth: Feb 25, 1956 Referring Provider: Dr. Berenice Myers  Encounter Date: 05/03/2015      PT End of Session - 05/03/15 1457    Visit Number 4   Number of Visits 12   Date for PT Re-Evaluation 05/26/15   Authorization Type BCBS/medicare    PT Start Time 1305   PT Stop Time 1348   PT Time Calculation (min) 43 min   Equipment Utilized During Treatment Gait belt   Behavior During Therapy [facility] for tasks assessed/performed      Past Medical History  Diagnosis Date  . Hyperlipidemia     takes Simvastatin nightly  . Seasonal allergies     in spring  . History of migraine     last one was on 06/02/12;takes Topamax bid and Imitrex prn  . Arthritis   . Joint pain   . Joint swelling   . Insomnia     takes Trazodone nightly  . Headache(784.0)   . Cancer [facility]) 1978     cervical cancer    Past Surgical History  Procedure Laterality Date  . Cyst removed from right knee   1972  . Left knee surgery  1973  . Abdominal hysterectomy  1978  . Right knee arthroscopy  90's     x 3  . Right knee partial replacement  1991  . Bladder tacked  1999  . Breast surgery  2009    cyst removed from left breast  . Right wrist sugery  2008  . Scar tissue removed  2008  . Appendectomy  2008  . Right wrist surgery    . Left knee surgery  2012  . Left knee surgery  2012  . Right carpal tunnel    . Colonoscopy    . Artroplasty    . Total knee arthroplasty  06/12/2012    LEFT KNEE  . Total knee arthroplasty  06/12/2012    Procedure: TOTAL KNEE ARTHROPLASTY;  Surgeon: Laurie Lights, MD;  Location: [place];  Service: Orthopedics;  Laterality: Left;  . Total knee revision Left 03/26/2013    Procedure: TOTAL KNEE REVISION- left;  Surgeon: Laurie Corning, MD;  Location: [place];   Service: Orthopedics;  Laterality: Left;  . Total knee arthroplasty Right 04/05/2015  . Total knee revision Right 04/05/2015    Procedure: TOTAL KNEE REVISION;  Surgeon: Laurie Leitz, MD;  Location: [place];  Service: Orthopedics;  Laterality: Right;    There were no vitals filed for this visit.  Visit Diagnosis:  Stiffness of right knee  Pain in right knee  Weakness of right leg  Difficulty walking  Difficulty walking down step      Subjective Assessment - 05/03/15 1307    Subjective Pt states that she has been in a high amount of pain all weekend.     Currently in Pain? Yes   Pain Score 4    Pain Location Knee   Pain Orientation Right   Pain Descriptors / Indicators Aching   Pain Type Surgical pain                [facility] Adult PT Treatment/Exercise - 05/03/15 0001    Knee/Hip Exercises: Stretches   Active Hamstring Stretch Right;3 reps;30 seconds   Active Hamstring Stretch Limitations supine   Passive Hamstring  Stretch Right;3 reps;30 seconds   Passive Hamstring Stretch Limitations on box    Quad Stretch Right;3 reps;30 seconds   Knee: Self-Stretch to increase Flexion Right;3 reps;30 seconds   Knee/Hip Exercises: Standing   Heel Raises Both;10 reps   Knee Flexion 10 reps   Terminal Knee Extension Limitations x10   Functional Squat 10 reps   Rocker Board 2 minutes   SLS x5 max 28    Knee/Hip Exercises: Supine   Quad Sets Right;10 reps   Heel Slides Right;10 reps   Knee Extension Limitations 12   Knee Flexion Limitations 90   Knee/Hip Exercises: Prone   Hip Extension Strengthening;10 reps   Manual Therapy   Manual Therapy Soft tissue mobilization;Myofascial release   Manual therapy comments supine with elevation   Soft tissue mobilization to Rt knee                  PT Short Term Goals - 04/26/15 1217    PT SHORT TERM GOAL #1   Title I HEP   Time 1   Period Weeks   Status New   PT SHORT TERM GOAL #2   Title ROM to improve to 4-110 to allow  pt to be comfortable while sitting for 30 minutes.    Time 2   Period Weeks   Status New   PT SHORT TERM GOAL #3   Title quadricep strength to be 3+/5 to allow pt to be able to tolerate standing for 15 minutes to make a small meal.    Time 2   Period Weeks   Status New   PT SHORT TERM GOAL #4   Title Pt pain level to be no greater than a 5/10 in the past week    Time 2   Period Weeks   Status New   PT SHORT TERM GOAL #5   Title Pt to be walking for 15 minutes at a time for improved health habits.    Time 2   Period Weeks           PT Long Term Goals - 04/26/15 1220    PT LONG TERM GOAL #1   Title Pt to be I in advance HEP   Time 4   Period Weeks   PT LONG TERM GOAL #2   Title Pt ROM to be 2-115 to  allow pt to have a normalized gait and to be able to sit for an hour at a time with comfot to be able to dine   Time 4   Period Weeks   Status New   PT LONG TERM GOAL #3   Title Pt strength to be at least 4+/5 to be able to walk for 30 mintues for better health habits.    Time 4   Period Weeks   Status New   PT LONG TERM GOAL #4   Title Pt to be able to go up and down steps using one hand in a reciprical manner   Time 4   Period Weeks   Status New   PT LONG TERM GOAL #5   Title Pt pain to be no greater than a 3/10 to allow pt to sleep without waking from pain    Time 4   Period Weeks               Plan - 05/03/15 1458    Clinical Impression Statement [name] is concerned about her level of pain.  Due to her pain she  is not pushing her ROM.  Explained to keep icing and to only do the exercixes until she feels a stretch. All exercises were completed with therapist facilitation.     PT Next Visit Plan Concentrate on ROM no strengthening at this time.         Problem List Patient Active Problem List   Diagnosis Date Noted  . Primary osteoarthritis of right knee 04/05/2015  . Pain due to knee joint prosthesis ([place]) 04/05/2015  . Status post revision of  total replacement of left knee 04/15/2013  . Pain due to total left knee replacement ([place]) 03/27/2013  . Muscle weakness (generalized) 07/02/2012  . Total knee replacement status 07/02/2012  . Difficulty in walking(719.7) 07/02/2012  . Stiffness of left knee 07/02/2012  . Pain in left knee 07/02/2012  Rayetta Humphrey, PT CLT [phone] 05/03/2015, 3:02 PM  [facility] [address] [place], [place], [address] Phone: [phone]   Fax:  [phone]  Name: [name] MRN: [id number] Date of Birth: 23-Jul-1955

## 2015-05-05 ENCOUNTER — Ambulatory Visit (HOSPITAL_COMMUNITY): Payer: Medicare Other | Attending: Orthopedic Surgery | Admitting: Physical Therapy

## 2015-05-05 DIAGNOSIS — M25662 Stiffness of left knee, not elsewhere classified: Secondary | ICD-10-CM | POA: Insufficient documentation

## 2015-05-05 DIAGNOSIS — M25562 Pain in left knee: Secondary | ICD-10-CM | POA: Insufficient documentation

## 2015-05-05 DIAGNOSIS — R262 Difficulty in walking, not elsewhere classified: Secondary | ICD-10-CM | POA: Insufficient documentation

## 2015-05-05 DIAGNOSIS — M25661 Stiffness of right knee, not elsewhere classified: Secondary | ICD-10-CM | POA: Insufficient documentation

## 2015-05-05 DIAGNOSIS — Z96651 Presence of right artificial knee joint: Secondary | ICD-10-CM | POA: Insufficient documentation

## 2015-05-05 DIAGNOSIS — M25561 Pain in right knee: Secondary | ICD-10-CM

## 2015-05-05 DIAGNOSIS — R29898 Other symptoms and signs involving the musculoskeletal system: Secondary | ICD-10-CM | POA: Diagnosis present

## 2015-05-05 DIAGNOSIS — M6281 Muscle weakness (generalized): Secondary | ICD-10-CM | POA: Insufficient documentation

## 2015-05-05 NOTE — Therapy (Signed)
[place], [place], [address] Phone: [phone]   Fax:  [phone]  Physical Therapy Treatment  Patient Details  Name: [name] MRN: [id number] Date of Birth: May 13, 1956 Referring Provider: Dr. Berenice Primas  Encounter Date: 05/05/2015      PT End of Session - 05/05/15 1548    Visit Number 5   Number of Visits 2   Date for PT Re-Evaluation 05/26/15   Authorization Type BCBS/medicare    PT Start Time 1302   PT Stop Time 1359   PT Time Calculation (min) 57 min   Activity Tolerance Patient tolerated treatment well      Past Medical History  Diagnosis Date  . Hyperlipidemia     takes Simvastatin nightly  . Seasonal allergies     in spring  . History of migraine     last one was on 06/02/12;takes Topamax bid and Imitrex prn  . Arthritis   . Joint pain   . Joint swelling   . Insomnia     takes Trazodone nightly  . Headache(784.0)   . Cancer [facility]) 1978     cervical cancer    Past Surgical History  Procedure Laterality Date  . Cyst removed from right knee   1972  . Left knee surgery  1973  . Abdominal hysterectomy  1978  . Right knee arthroscopy  90's     x 3  . Right knee partial replacement  1991  . Bladder tacked  1999  . Breast surgery  2009    cyst removed from left breast  . Right wrist sugery  2008  . Scar tissue removed  2008  . Appendectomy  2008  . Right wrist surgery    . Left knee surgery  2012  . Left knee surgery  2012  . Right carpal tunnel    . Colonoscopy    . Artroplasty    . Total knee arthroplasty  06/12/2012    LEFT KNEE  . Total knee arthroplasty  06/12/2012    Procedure: TOTAL KNEE ARTHROPLASTY;  Surgeon: Ninetta Lights, MD;  Location: [place];  Service: Orthopedics;  Laterality: Left;  . Total knee revision Left 03/26/2013    Procedure: TOTAL KNEE REVISION- left;  Surgeon: Alta Corning, MD;  Location: [place];  Service: Orthopedics;  Laterality: Left;  . Total knee  arthroplasty Right 04/05/2015  . Total knee revision Right 04/05/2015    Procedure: TOTAL KNEE REVISION;  Surgeon: Dorna Leitz, MD;  Location: [place];  Service: Orthopedics;  Laterality: Right;    There were no vitals filed for this visit.  Visit Diagnosis:  Stiffness of right knee  Pain in right knee  Weakness of right leg  Difficulty walking down step  Difficulty walking      Subjective Assessment - 05/05/15 1259    Subjective Pt states that she continues to have a high amount of pain in the morning.      Currently in Pain? Yes   Pain Score 4    Pain Location Knee   Pain Orientation Right                 [facility] Adult PT Treatment/Exercise - 05/05/15 1302    Knee/Hip Exercises: Stretches   Knee: Self-Stretch to increase Flexion Right;3 reps;30 seconds   Gastroc Stretch Right;3 reps;30 seconds   Gastroc Stretch Limitations slant board    Knee/Hip Exercises: Aerobic   Stationary Bike 8' seat at 46  Knee/Hip Exercises: Standing   Knee Flexion 10 reps   Knee Flexion Limitations on 4" step    Knee/Hip Exercises: Seated   Heel Slides Right;10 reps   Knee/Hip Exercises: Supine   Quad Sets Right;10 reps   Heel Slides 10 reps   Knee Extension PROM   Knee Extension Limitations 8   Knee Flexion PROM   Knee Flexion Limitations 95   Knee/Hip Exercises: Prone   Hamstring Curl 10 reps   Contract/Relax to Increase Flexion x5   Straight Leg Raises Limitations terminal extension                   PT Short Term Goals - 04/26/15 1217    PT SHORT TERM GOAL #1   Title I HEP   Time 1   Period Weeks   Status New   PT SHORT TERM GOAL #2   Title ROM to improve to 4-110 to allow pt to be comfortable while sitting for 30 minutes.    Time 2   Period Weeks   Status New   PT SHORT TERM GOAL #3   Title quadricep strength to be 3+/5 to allow pt to be able to tolerate standing for 15 minutes to make a small meal.    Time 2   Period Weeks   Status New   PT SHORT  TERM GOAL #4   Title Pt pain level to be no greater than a 5/10 in the past week    Time 2   Period Weeks   Status New   PT SHORT TERM GOAL #5   Title Pt to be walking for 15 minutes at a time for improved health habits.    Time 2   Period Weeks           PT Long Term Goals - 04/26/15 1220    PT LONG TERM GOAL #1   Title Pt to be I in advance HEP   Time 4   Period Weeks   PT LONG TERM GOAL #2   Title Pt ROM to be 2-115 to  allow pt to have a normalized gait and to be able to sit for an hour at a time with comfot to be able to dine   Time 4   Period Weeks   Status New   PT LONG TERM GOAL #3   Title Pt strength to be at least 4+/5 to be able to walk for 30 mintues for better health habits.    Time 4   Period Weeks   Status New   PT LONG TERM GOAL #4   Title Pt to be able to go up and down steps using one hand in a reciprical manner   Time 4   Period Weeks   Status New   PT LONG TERM GOAL #5   Title Pt pain to be no greater than a 3/10 to allow pt to sleep without waking from pain    Time 4   Period Weeks               Plan - 05/05/15 1549    Clinical Impression Statement Pt concerned about pain as well as not being able to obtain improved motion.  Treatment focused on edema and ROM with manual techniques for edema and PROM/AA to improve ROM.  Pt ROM has improved since Monday.  Explained that we willl keep an eye on ROM if ROM does not continue to progress pt may be a candidate for  the JAS.    PT Next Visit Plan Concentrate on ROM no strengthening at this time.         Problem List Patient Active Problem List   Diagnosis Date Noted  . Primary osteoarthritis of right knee 04/05/2015  . Pain due to knee joint prosthesis ([place]) 04/05/2015  . Status post revision of total replacement of left knee 04/15/2013  . Pain due to total left knee replacement ([place]) 03/27/2013  . Muscle weakness (generalized) 07/02/2012  . Total knee replacement status 07/02/2012  .  Difficulty in walking(719.7) 07/02/2012  . Stiffness of left knee 07/02/2012  . Pain in left knee 07/02/2012    Rayetta Humphrey, PT CLT [phone] 05/05/2015, 3:52 PM  [facility] [address] [place], [place], [address] Phone: [phone]   Fax:  [phone]  Name: [name] MRN: [id number] Date of Birth: 09-26-1955

## 2015-05-07 ENCOUNTER — Ambulatory Visit (HOSPITAL_COMMUNITY): Payer: Medicare Other

## 2015-05-07 DIAGNOSIS — M25661 Stiffness of right knee, not elsewhere classified: Secondary | ICD-10-CM | POA: Diagnosis not present

## 2015-05-07 DIAGNOSIS — M25561 Pain in right knee: Secondary | ICD-10-CM

## 2015-05-07 DIAGNOSIS — M6281 Muscle weakness (generalized): Secondary | ICD-10-CM

## 2015-05-07 DIAGNOSIS — M25562 Pain in left knee: Secondary | ICD-10-CM

## 2015-05-07 DIAGNOSIS — M25662 Stiffness of left knee, not elsewhere classified: Secondary | ICD-10-CM

## 2015-05-07 DIAGNOSIS — R29898 Other symptoms and signs involving the musculoskeletal system: Secondary | ICD-10-CM

## 2015-05-07 DIAGNOSIS — R262 Difficulty in walking, not elsewhere classified: Secondary | ICD-10-CM

## 2015-05-07 NOTE — Therapy (Signed)
[place], [place], [address] Phone: [phone]   Fax:  [phone]  Physical Therapy Treatment  Patient Details  Name: [name] MRN: [id number] Date of Birth: December 20, 1955 Referring Provider: Dr. Berenice Primas  Encounter Date: 05/07/2015      [name] End of Session - 05/07/15 1643    Visit Number 6   Number of Visits 2   Date for [name] Re-Evaluation 05/26/15   Authorization Type BCBS/medicare    [name] Start Time 1348   [name] Stop Time 1427   [name] Time Calculation (min) 39 min   Activity Tolerance Patient tolerated treatment well   Behavior During Therapy [facility] for tasks assessed/performed      Past Medical History  Diagnosis Date  . Hyperlipidemia     takes Simvastatin nightly  . Seasonal allergies     in spring  . History of migraine     last one was on 06/02/12;takes Topamax bid and Imitrex prn  . Arthritis   . Joint pain   . Joint swelling   . Insomnia     takes Trazodone nightly  . Headache(784.0)   . Cancer [facility]) 1978     cervical cancer    Past Surgical History  Procedure Laterality Date  . Cyst removed from right knee   1972  . Left knee surgery  1973  . Abdominal hysterectomy  1978  . Right knee arthroscopy  90's     x 3  . Right knee partial replacement  1991  . Bladder tacked  1999  . Breast surgery  2009    cyst removed from left breast  . Right wrist sugery  2008  . Scar tissue removed  2008  . Appendectomy  2008  . Right wrist surgery    . Left knee surgery  2012  . Left knee surgery  2012  . Right carpal tunnel    . Colonoscopy    . Artroplasty    . Total knee arthroplasty  06/12/2012    LEFT KNEE  . Total knee arthroplasty  06/12/2012    Procedure: TOTAL KNEE ARTHROPLASTY;  Surgeon: Ninetta Lights, MD;  Location: [place];  Service: Orthopedics;  Laterality: Left;  . Total knee revision Left 03/26/2013    Procedure: TOTAL KNEE REVISION- left;  Surgeon: Alta Corning, MD;  Location: [place];  Service: Orthopedics;  Laterality: Left;  . Total knee arthroplasty Right 04/05/2015  . Total knee revision Right 04/05/2015    Procedure: TOTAL KNEE REVISION;  Surgeon: Dorna Leitz, MD;  Location: [place];  Service: Orthopedics;  Laterality: Right;    There were no vitals filed for this visit.  Visit Diagnosis:  Stiffness of right knee  Pain in right knee  Weakness of right leg  Difficulty walking down step  Difficulty walking  Stiffness of left knee  Muscle weakness (generalized)  Pain in left knee      Subjective Assessment - 05/07/15 1351    Subjective [name] reports that she has been frustrated with limited bending. Has been compliant for with HEP    Pertinent History [name] had a cyst removed from her cartilage when she was 15; they removed her cyst with all of her cartilage, she has had multiple surgeries on her knee. [name] states that she had a right knee replacement on 04/05/2015; she was discharged on the 4th and referred to [facility].  She is now being referred to out-patient therapy to maximize her  functional ability. She stopped her [place] on 04/23/2015.  She states the bend is not what it should be and she is having a lot of pain with it.  She has had pain in her knee since she was 15 and has been wearing a brace on her knee for the past 3 years.    Patient Stated Goals less pain, better bend; to be able walk more better sleeping.( waking up every 3 hours.  unable to sleep past 4:00 o'clock.    Currently in Pain? Yes   Pain Score 5    Pain Location Knee   Pain Orientation Right   Pain Descriptors / Indicators Aching   Pain Type Surgical pain   Pain Onset 1 to 4 weeks ago   Pain Frequency Constant   Aggravating Factors  most activity                         [facility] Adult [name] Treatment/Exercise - 05/07/15 0001    Knee/Hip Exercises: Stretches   Knee: Self-Stretch to increase Flexion Right;5 reps;60 seconds;Limitations   Knee: Self-Stretch Limitations seated  with forward pelvic shift + massage  taught for HEP use   Knee/Hip Exercises: Seated   Hamstring Curl 1 set;20 reps;Right  Green TB to utilize new ROM after stretching   Knee/Hip Exercises: Supine   Quad Sets Right;10 reps   Bridges Limitations supine c feet on 16 inch box  2x15 for HS mediates posterior glide   Knee Flexion Limitations 102  manual over pressure   Manual Therapy   Joint Mobilization 3x60sec tibfem posterior glide Gr IV  hip/knee at 90/90   Soft tissue mobilization to Rt knee   Myofascial Release to Rt knee                  [name] Short Term Goals - 04/26/15 1217    [name] SHORT TERM GOAL #1   Title I HEP   Time 1   Period Weeks   Status New   [name] SHORT TERM GOAL #2   Title ROM to improve to 4-110 to allow [name] to be comfortable while sitting for 30 minutes.    Time 2   Period Weeks   Status New   [name] SHORT TERM GOAL #3   Title quadricep strength to be 3+/5 to allow [name] to be able to tolerate standing for 15 minutes to make a small meal.    Time 2   Period Weeks   Status New   [name] SHORT TERM GOAL #4   Title [name] pain level to be no greater than a 5/10 in the past week    Time 2   Period Weeks   Status New   [name] SHORT TERM GOAL #5   Title [name] to be walking for 15 minutes at a time for improved health habits.    Time 2   Period Weeks           [name] Long Term Goals - 04/26/15 1220    [name] LONG TERM GOAL #1   Title [name] to be I in advance HEP   Time 4   Period Weeks   [name] LONG TERM GOAL #2   Title [name] ROM to be 2-115 to  allow [name] to have a normalized gait and to be able to sit for an hour at a time with comfot to be able to dine   Time 4   Period Weeks   Status New  [name] LONG TERM GOAL #3   Title [name] strength to be at least 4+/5 to be able to walk for 30 mintues for better health habits.    Time 4   Period Weeks   Status New   [name] LONG TERM GOAL #4   Title [name] to be able to go up and down steps using one hand in a reciprical manner   Time 4   Period Weeks    Status New   [name] LONG TERM GOAL #5   Title [name] pain to be no greater than a 3/10 to allow [name] to sleep without waking from pain    Time 4   Period Weeks               Plan - 05/07/15 1644    Clinical Impression Statement [name] continues to voice concerns about limited progress in mobility. This session focuessed exclusively on strategies to improve ROM with a strong carryover to HEP updates. [name] began session at 83 degree and was able to get to 111 degrees after considerable time spent  toward this goal. Multiple strategies were made to improve the posterior glide of the tibia on the femurr, which appears to be improving mildly. within session.    [name] will benefit from skilled therapeutic intervention in order to improve on the following deficits Abnormal gait;Decreased balance;Decreased endurance;Decreased range of motion;Decreased strength;Difficulty walking;Hypomobility;Increased edema;Increased fascial restricitons;Pain   Rehab Potential Good   Clinical Impairments Affecting Rehab Potential 7 Surgeries total on this same knee leading to chronic periods of weakness.    [name] Frequency 3x / week   [name] Duration 4 weeks   [name] Treatment/Interventions ADLs/Self Care Home Management;Gait training;Stair training;Functional mobility training;Therapeutic activities;Therapeutic exercise;Balance training;Patient/family education;Passive range of motion;Manual techniques   [name] Next Visit Plan Continue to focus on improving flexion followed by hamstrings faciliatation of AROM into newly acquired range.    [name] Home Exercise Plan Folow up and get feedback newly added stretches at next session.    Consulted and Agree with Plan of Care Patient        Problem List Patient Active Problem List   Diagnosis Date Noted  . Primary osteoarthritis of right knee 04/05/2015  . Pain due to knee joint prosthesis ([place]) 04/05/2015  . Status post revision of total replacement of left knee 04/15/2013  . Pain due to total  left knee replacement ([place]) 03/27/2013  . Muscle weakness (generalized) 07/02/2012  . Total knee replacement status 07/02/2012  . Difficulty in walking(719.7) 07/02/2012  . Stiffness of left knee 07/02/2012  . Pain in left knee 07/02/2012    Buccola,Allan C 05/07/2015, 4:49 PM 4:49 PM  Etta Grandchild, [name], DPT [place] License # [id number]       [facility] [address] [place], [place], [address] Phone: [phone]   Fax:  [phone]  Name: [name] MRN: [id number] Date of Birth: 12-19-1955

## 2015-05-10 ENCOUNTER — Ambulatory Visit (HOSPITAL_COMMUNITY): Payer: Medicare Other | Admitting: Physical Therapy

## 2015-05-10 DIAGNOSIS — M25661 Stiffness of right knee, not elsewhere classified: Secondary | ICD-10-CM

## 2015-05-10 DIAGNOSIS — M25561 Pain in right knee: Secondary | ICD-10-CM

## 2015-05-10 DIAGNOSIS — R29898 Other symptoms and signs involving the musculoskeletal system: Secondary | ICD-10-CM

## 2015-05-10 DIAGNOSIS — R262 Difficulty in walking, not elsewhere classified: Secondary | ICD-10-CM

## 2015-05-10 NOTE — Therapy (Signed)
[place], [place], [address] Phone: [phone]   Fax:  [phone]  Physical Therapy Treatment  Patient Details  Name: [name] MRN: [id number] Date of Birth: June 23, 1956 Referring Provider: Dr. Berenice Primas  Encounter Date: 05/10/2015      PT End of Session - 05/10/15 1346    Visit Number 7   Number of Visits 12   Date for PT Re-Evaluation 05/26/15   PT Start Time 1302   PT Stop Time 4401   PT Time Calculation (min) 45 min   Equipment Utilized During Treatment Gait belt   Activity Tolerance Patient tolerated treatment well      Past Medical History  Diagnosis Date  . Hyperlipidemia     takes Simvastatin nightly  . Seasonal allergies     in spring  . History of migraine     last one was on 06/02/12;takes Topamax bid and Imitrex prn  . Arthritis   . Joint pain   . Joint swelling   . Insomnia     takes Trazodone nightly  . Headache(784.0)   . Cancer [facility]) 1978     cervical cancer    Past Surgical History  Procedure Laterality Date  . Cyst removed from right knee   1972  . Left knee surgery  1973  . Abdominal hysterectomy  1978  . Right knee arthroscopy  90's     x 3  . Right knee partial replacement  1991  . Bladder tacked  1999  . Breast surgery  2009    cyst removed from left breast  . Right wrist sugery  2008  . Scar tissue removed  2008  . Appendectomy  2008  . Right wrist surgery    . Left knee surgery  2012  . Left knee surgery  2012  . Right carpal tunnel    . Colonoscopy    . Artroplasty    . Total knee arthroplasty  06/12/2012    LEFT KNEE  . Total knee arthroplasty  06/12/2012    Procedure: TOTAL KNEE ARTHROPLASTY;  Surgeon: Ninetta Lights, MD;  Location: [place];  Service: Orthopedics;  Laterality: Left;  . Total knee revision Left 03/26/2013    Procedure: TOTAL KNEE REVISION- left;  Surgeon: Alta Corning, MD;  Location: [place];  Service: Orthopedics;  Laterality: Left;  .  Total knee arthroplasty Right 04/05/2015  . Total knee revision Right 04/05/2015    Procedure: TOTAL KNEE REVISION;  Surgeon: Dorna Leitz, MD;  Location: [place];  Service: Orthopedics;  Laterality: Right;    There were no vitals filed for this visit.  Visit Diagnosis:  Stiffness of right knee  Pain in right knee  Weakness of right leg  Difficulty walking down step  Difficulty walking      Subjective Assessment - 05/10/15 1307    Subjective Pt states she has been walking a lot this weekend trying to get her jknee to work better.  Pt continues to walk with her leg straight rather than bending her knee    Currently in Pain? Yes   Pain Score 5    Pain Location Knee   Pain Orientation Right               [facility] Adult PT Treatment/Exercise - 05/10/15 0001    Ambulation/Gait   Ambulation/Gait Yes   Ambulation/Gait Assistance 5: Supervision   Ambulation/Gait Assistance Details heel toe gt.    Ambulation Distance (Feet) 113  Feet   Assistive device Straight cane   Knee/Hip Exercises: Stretches   Sports administrator Right;3 reps;30 seconds   Knee/Hip Exercises: Aerobic   Stationary Bike 8' seat at 11    Knee/Hip Exercises: Seated   Heel Slides Right;10 reps   Knee/Hip Exercises: Supine   Quad Sets 10 reps   Heel Slides 5 reps   Knee Extension Limitations 7   Knee Flexion Limitations 98  supine    Knee/Hip Exercises: Prone   Hamstring Curl 10 reps   Contract/Relax to Increase Flexion x5   Prone Knee Hang Limitations quadriped positon hips back until stretch.    Straight Leg Raises Limitations terminal extension                   PT Short Term Goals - 04/26/15 1217    PT SHORT TERM GOAL #1   Title I HEP   Time 1   Period Weeks   Status New   PT SHORT TERM GOAL #2   Title ROM to improve to 4-110 to allow pt to be comfortable while sitting for 30 minutes.    Time 2   Period Weeks   Status New   PT SHORT TERM GOAL #3   Title quadricep strength to be 3+/5 to  allow pt to be able to tolerate standing for 15 minutes to make a small meal.    Time 2   Period Weeks   Status New   PT SHORT TERM GOAL #4   Title Pt pain level to be no greater than a 5/10 in the past week    Time 2   Period Weeks   Status New   PT SHORT TERM GOAL #5   Title Pt to be walking for 15 minutes at a time for improved health habits.    Time 2   Period Weeks           PT Long Term Goals - 04/26/15 1220    PT LONG TERM GOAL #1   Title Pt to be I in advance HEP   Time 4   Period Weeks   PT LONG TERM GOAL #2   Title Pt ROM to be 2-115 to  allow pt to have a normalized gait and to be able to sit for an hour at a time with comfot to be able to dine   Time 4   Period Weeks   Status New   PT LONG TERM GOAL #3   Title Pt strength to be at least 4+/5 to be able to walk for 30 mintues for better health habits.    Time 4   Period Weeks   Status New   PT LONG TERM GOAL #4   Title Pt to be able to go up and down steps using one hand in a reciprical manner   Time 4   Period Weeks   Status New   PT LONG TERM GOAL #5   Title Pt pain to be no greater than a 3/10 to allow pt to sleep without waking from pain    Time 4   Period Weeks               Plan - 05/10/15 1347    Clinical Impression Statement Pt continues to slowly increase in ROM.  Therapist will send prescription to MD for JAS.  Pt was able to demonstrate appropriate heel toe gait with multimodal cuing.    PT Next Visit Plan Focus on proper gt  and ROM at this time        Problem List Patient Active Problem List   Diagnosis Date Noted  . Primary osteoarthritis of right knee 04/05/2015  . Pain due to knee joint prosthesis ([place]) 04/05/2015  . Status post revision of total replacement of left knee 04/15/2013  . Pain due to total left knee replacement ([place]) 03/27/2013  . Muscle weakness (generalized) 07/02/2012  . Total knee replacement status 07/02/2012  . Difficulty in walking(719.7) 07/02/2012   . Stiffness of left knee 07/02/2012  . Pain in left knee 07/02/2012    Rayetta Humphrey, PT CLT [phone] 05/10/2015, 3:10 PM  [place] [address] [place], [place], [address] Phone: [phone]   Fax:  [phone]  Name: [name] MRN: [id number] Date of Birth: 1956-02-16

## 2015-05-12 ENCOUNTER — Ambulatory Visit (HOSPITAL_COMMUNITY): Payer: Medicare Other | Admitting: Physical Therapy

## 2015-05-12 DIAGNOSIS — M25561 Pain in right knee: Secondary | ICD-10-CM

## 2015-05-12 DIAGNOSIS — M25661 Stiffness of right knee, not elsewhere classified: Secondary | ICD-10-CM | POA: Diagnosis not present

## 2015-05-12 DIAGNOSIS — R29898 Other symptoms and signs involving the musculoskeletal system: Secondary | ICD-10-CM

## 2015-05-12 DIAGNOSIS — R262 Difficulty in walking, not elsewhere classified: Secondary | ICD-10-CM

## 2015-05-12 NOTE — Therapy (Signed)
[place], [place], [address] Phone: [phone]   Fax:  [phone]  Physical Therapy Treatment  Patient Details  Name: [name] MRN: [id number] Date of Birth: [birth year] Referring Provider: Dr. Berenice Primas  Encounter Date: 05/12/2015      PT End of Session - 05/12/15 1342    Visit Number 8   Number of Visits 24   Date for PT Re-Evaluation 06/11/15   Authorization Type BCBS/medicare    PT Start Time 1300   PT Stop Time 1353   PT Time Calculation (min) 53 min   Activity Tolerance Patient tolerated treatment well   Behavior During Therapy [facility] for tasks assessed/performed      Past Medical History  Diagnosis Date  . Hyperlipidemia     takes Simvastatin nightly  . Seasonal allergies     in spring  . History of migraine     last one was on 06/02/12;takes Topamax bid and Imitrex prn  . Arthritis   . Joint pain   . Joint swelling   . Insomnia     takes Trazodone nightly  . Headache(784.0)   . Cancer [facility]) 1978     cervical cancer    Past Surgical History  Procedure Laterality Date  . Cyst removed from right knee   1972  . Left knee surgery  1973  . Abdominal hysterectomy  1978  . Right knee arthroscopy  90's     x 3  . Right knee partial replacement  1991  . Bladder tacked  1999  . Breast surgery  2009    cyst removed from left breast  . Right wrist sugery  2008  . Scar tissue removed  2008  . Appendectomy  2008  . Right wrist surgery    . Left knee surgery  2012  . Left knee surgery  2012  . Right carpal tunnel    . Colonoscopy    . Artroplasty    . Total knee arthroplasty  06/12/2012    LEFT KNEE  . Total knee arthroplasty  06/12/2012    Procedure: TOTAL KNEE ARTHROPLASTY;  Surgeon: Ninetta Lights, MD;  Location: [place];  Service: Orthopedics;  Laterality: Left;  . Total knee revision Left 03/26/2013    Procedure: TOTAL KNEE REVISION- left;  Surgeon: Alta Corning, MD;  Location: [place];  Service: Orthopedics;  Laterality: Left;  . Total knee arthroplasty Right 04/05/2015  . Total knee revision Right 04/05/2015    Procedure: TOTAL KNEE REVISION;  Surgeon: Dorna Leitz, MD;  Location: [place];  Service: Orthopedics;  Laterality: Right;    There were no vitals filed for this visit.  Visit Diagnosis:  Stiffness of right knee  Pain in right knee  Weakness of right leg  Difficulty walking down step  Difficulty walking      Subjective Assessment - 05/12/15 1309    Subjective Pt states that she had increased pain and swelling after last treatment.  She did not do a lot and iced quite a bit yesterday.  Her most difficult thing is bending still    Pertinent History pt had a cyst removed from her cartilage when she was 15; they removed her cyst with all of her cartilage, she has had multiple surgeries on her knee. [name] states that she had a right knee replacement on 04/05/2015; she was discharged on the 4th and referred to [facility].  She is now being referred to out-patient therapy  to maximize her functional ability. She stopped her [place] on 04/23/2015.  She states the bend is not what it should be and she is having a lot of pain with it.  She has had pain in her knee since she was 15 and has been wearing a brace on her knee for the past 3 years.    How long can you sit comfortably? 05/12/2015 Pt is able to sit for 20 minutes with comfort now was five minutes.     How long can you stand comfortably? 05/12/2015 Pt is able to stand for five minutes before she has increased pain was five minutes    How long can you walk comfortably? walking with a cane outside; without inside.  The longest she has walked has been 30 minutes was five.    Patient Stated Goals less pain, better bend; to be able walk more better sleeping.( waking up every 3 hours.  unable to sleep past 4:00 o'clock.    Currently in Pain? Yes   Pain Score 4    Pain Location Knee   Pain Orientation Right   Pain Descriptors /  Indicators Aching   Pain Type Surgical pain   Pain Onset 1 to 4 weeks ago   Pain Frequency Constant            [facility] PT Assessment - 05/12/15 0001    Assessment   Medical Diagnosis Rt TKR   Onset Date/Surgical Date 04/05/15   Next MD Visit 05/13/2015   Prior Therapy HH   Precautions   Precautions None   Restrictions   Weight Bearing Restrictions No   Home Environment   Living Environment Private residence   Type of [place] to enter   Entrance Stairs-Number of Steps 6   Entrance Stairs-Rails Can reach both   [place] One level   Prior Function   Level of Independence Independent   Vocation Full time employment   Leisure quilt    Cognition   Overall Cognitive Status Within Functional Limits for tasks assessed   Observation/Other Assessments   Focus on Therapeutic Outcomes (FOTO)  --   Functional Tests   Functional tests Single leg stance   Single Leg Stance   Comments Rt 1:25"";Lt 63"  initially Rt 20" Lt 40"    ROM / Strength   AROM / PROM / Strength AROM   AROM   AROM Assessment Site Knee   Right/Left Knee Right   Right Knee Extension 6   Right Knee Flexion 99   Strength   Right Hip Flexion 5/5   Right Hip Extension 5/5  was 3/5    Right Hip ABduction 5/5  was 4+/5    Right Knee Flexion 5/5   Right Knee Extension 4-/5  was 3/5    Right Ankle Dorsiflexion 5/5                     [facility] Adult PT Treatment/Exercise - 05/12/15 0001    Ambulation/Gait   Ambulation/Gait Yes   Ambulation/Gait Assistance 5: Supervision   Ambulation Distance (Feet) 113 Feet   Assistive device Straight cane   Knee/Hip Exercises: Stretches   Sports administrator Right;3 reps;30 seconds   Knee/Hip Exercises: Aerobic   Stationary Bike 8' seat at 11    Knee/Hip Exercises: Seated   Heel Slides Right;10 reps   Knee/Hip Exercises: Supine   Quad Sets 10 reps   Heel Slides 10 reps   Knee Extension Limitations  8   Knee Flexion Limitations 99   supine    Knee/Hip Exercises: Prone   Hamstring Curl 10 reps   Contract/Relax to Increase Flexion x5   Prone Knee Hang Limitations quadriped positon hips back until stretch.    Straight Leg Raises Limitations terminal extension                   PT Short Term Goals - 05/12/15 1349    PT SHORT TERM GOAL #1   Title I HEP   Time 1   Period Weeks   Status Achieved   PT SHORT TERM GOAL #2   Title ROM to improve to 4-110 to allow pt to be comfortable while sitting for 30 minutes.    Time 2   Period Weeks   Status On-going   PT SHORT TERM GOAL #3   Title quadricep strength to be 3+/5 to allow pt to be able to tolerate standing for 15 minutes to make a small meal.    Baseline 05/12/2015 Pt quadricep strength is there but pt is unable to stand due to pain in her knee.    Time 2   Period Weeks   Status On-going   PT SHORT TERM GOAL #4   Title Pt pain level to be no greater than a 5/10 in the past week    Time 2   Period Weeks   Status On-going   PT SHORT TERM GOAL #5   Title 2   Time 2   Period Weeks   Status Achieved           PT Long Term Goals - 05/12/15 1350    PT LONG TERM GOAL #1   Title Pt to be I in advance HEP   Time 4   Period Weeks   Status Achieved   PT LONG TERM GOAL #2   Title Pt ROM to be 2-115 to  allow pt to have a normalized gait and to be able to sit for an hour at a time with comfot to be able to dine   Baseline ROM6-99   Time 4   Period Weeks   Status On-going   PT LONG TERM GOAL #3   Title Pt strength to be at least 4+/5 to be able to walk for 30 mintues for better health habits.    Time 4   Period Weeks   Status Achieved   PT LONG TERM GOAL #4   Title Pt to be able to go up and down steps using one hand in a reciprical manner   Baseline just starting    Time 4   Period Weeks   Status On-going   PT LONG TERM GOAL #5   Title Pt pain to be no greater than a 3/10 to allow pt to sleep without waking from pain    Time 4   Period  Weeks   Status On-going               Plan - 05/12/15 1351    Clinical Impression Statement Pt reassessed today with balance and strength near normal.  Pt is still having difficulty with flexion, pain and deviation of gait.  Pt will continue to benefit from skilled PT to decrease her use of assistive device, improve her flexion for prolong sitting and decrease her pain for prolong standing activity.  JAS prescrioption was sent to MD>    Clinical Impairments Affecting Rehab Potential 7 Surgeries total on this same knee leading  to chronic periods of weakness.    PT Frequency 3x / week   PT Duration 8 weeks  for an additional 4 weeks    PT Next Visit Plan check on JAS prescription, work on manual to decrease swelling and flexion.  Progress note completed at visit 8 for MD visit.  Complete foto at visit 10 for G code         Problem List Patient Active Problem List   Diagnosis Date Noted  . Primary osteoarthritis of right knee 04/05/2015  . Pain due to knee joint prosthesis ([place]) 04/05/2015  . Status post revision of total replacement of left knee 04/15/2013  . Pain due to total left knee replacement ([place]) 03/27/2013  . Muscle weakness (generalized) 07/02/2012  . Total knee replacement status 07/02/2012  . Difficulty in walking(719.7) 07/02/2012  . Stiffness of left knee 07/02/2012  . Pain in left knee 07/02/2012    Rayetta Humphrey, PT CLT [phone]  05/12/2015, 1:59 PM  [place] [address] [place], [place], [address] Phone: [phone]   Fax:  [phone]  Name: [name] MRN: [id number] Date of Birth: 03/23/1956 Physical Therapy Progress Note  Dates of Reporting Period: 04/26/2015  To 05/12/2015  Objective Reports of Subjective Statement: see above  Objective Measurements: see above  Goal Update: see above  Plan: continue for 4 additional weeks for a total of 8 weeks   Reason Skilled Services are  Required:to improve ROM, decrease pain and improve functional tolerance.     Rayetta Humphrey, [place] CLT [phone]

## 2015-05-14 ENCOUNTER — Ambulatory Visit (HOSPITAL_COMMUNITY): Payer: Medicare Other | Admitting: Physical Therapy

## 2015-05-14 DIAGNOSIS — R262 Difficulty in walking, not elsewhere classified: Secondary | ICD-10-CM

## 2015-05-14 DIAGNOSIS — M25661 Stiffness of right knee, not elsewhere classified: Secondary | ICD-10-CM

## 2015-05-14 DIAGNOSIS — M25561 Pain in right knee: Secondary | ICD-10-CM

## 2015-05-14 DIAGNOSIS — R29898 Other symptoms and signs involving the musculoskeletal system: Secondary | ICD-10-CM

## 2015-05-14 NOTE — Therapy (Signed)
[place], [place], [address] Phone: [phone]   Fax:  [phone]  Physical Therapy Treatment  Patient Details  Name: ELYSSE Myers MRN: [id number] Date of Birth: Jan 10, 1956 Referring Provider: Dr. Berenice Myers  Encounter Date: 05/14/2015      PT End of Session - 05/14/15 1319    Visit Number 9   Number of Visits 24   Date for PT Re-Evaluation 06/11/15   Authorization Type BCBS/medicare    PT Start Time 1302   PT Stop Time 1356   PT Time Calculation (min) 54 min   Activity Tolerance Patient tolerated treatment well   Behavior During Therapy [facility] for tasks assessed/performed      Past Medical History  Diagnosis Date  . Hyperlipidemia     takes Simvastatin nightly  . Seasonal allergies     in spring  . History of migraine     last one was on 06/02/12;takes Topamax bid and Imitrex prn  . Arthritis   . Joint pain   . Joint swelling   . Insomnia     takes Trazodone nightly  . Headache(784.0)   . Cancer [facility]) 1978     cervical cancer    Past Surgical History  Procedure Laterality Date  . Cyst removed from right knee   1972  . Left knee surgery  1973  . Abdominal hysterectomy  1978  . Right knee arthroscopy  90's     x 3  . Right knee partial replacement  1991  . Bladder tacked  1999  . Breast surgery  2009    cyst removed from left breast  . Right wrist sugery  2008  . Scar tissue removed  2008  . Appendectomy  2008  . Right wrist surgery    . Left knee surgery  2012  . Left knee surgery  2012  . Right carpal tunnel    . Colonoscopy    . Artroplasty    . Total knee arthroplasty  06/12/2012    LEFT KNEE  . Total knee arthroplasty  06/12/2012    Procedure: TOTAL KNEE ARTHROPLASTY;  Surgeon: Laurie Lights, MD;  Location: [place];  Service: Orthopedics;  Laterality: Left;  . Total knee revision Left 03/26/2013    Procedure: TOTAL KNEE REVISION- left;  Surgeon: Laurie Corning, MD;  Location: [place];  Service: Orthopedics;  Laterality: Left;  . Total knee arthroplasty Right 04/05/2015  . Total knee revision Right 04/05/2015    Procedure: TOTAL KNEE REVISION;  Surgeon: Laurie Leitz, MD;  Location: [place];  Service: Orthopedics;  Laterality: Right;    There were no vitals filed for this visit.  Visit Diagnosis:  Stiffness of right knee  Pain in right knee  Weakness of right leg  Difficulty walking down step  Difficulty walking      Subjective Assessment - 05/14/15 1307    Subjective Pt states MD was overall pleased with her progress.  He is not concerned with the pain.    Pertinent History pt had a cyst removed from her cartilage when she was 15; they removed her cyst with all of her cartilage, she has had multiple surgeries on her knee. Ms. Prock states that she had a right knee replacement on 04/05/2015; she was discharged on the 4th and referred to [facility].  She is now being referred to out-patient therapy to maximize her functional ability. She stopped her [place] on 04/23/2015.  She states the bend  is not what it should be and she is having a lot of pain with it.  She has had pain in her knee since she was 15 and has been wearing a brace on her knee for the past 3 years.    Currently in Pain? Yes   Pain Score 4    Pain Location Knee   Pain Orientation Right   Pain Descriptors / Indicators Aching   Pain Type Chronic pain                         [facility] Adult PT Treatment/Exercise - 05/14/15 0001    Knee/Hip Exercises: Stretches   Pension scheme manager reps;Other (comment)   Quad Stretch Limitations 2 minutes    Knee/Hip Exercises: Aerobic   Stationary Bike 10"   Knee/Hip Exercises: Supine   Quad Sets Right;10 reps   Heel Slides 10 reps   Terminal Knee Extension Strengthening;Right;10 reps   Knee/Hip Exercises: Prone   Hamstring Curl 10 reps   Contract/Relax to Increase Flexion x5   Prone Knee Hang Limitations quadriped positon hips back until stretch.     Straight Leg Raises Limitations terminal extension                 PT Education - 05/14/15 1319    Education Details JAS and how it is used    [employer]) Educated Patient   Methods Explanation   Comprehension Verbalized understanding          PT Short Term Goals - 05/12/15 1349    PT SHORT TERM GOAL #1   Title I HEP   Time 1   Period Weeks   Status Achieved   PT SHORT TERM GOAL #2   Title ROM to improve to 4-110 to allow pt to be comfortable while sitting for 30 minutes.    Time 2   Period Weeks   Status On-going   PT SHORT TERM GOAL #3   Title quadricep strength to be 3+/5 to allow pt to be able to tolerate standing for 15 minutes to make a small meal.    Baseline 05/12/2015 Pt quadricep strength is there but pt is unable to stand due to pain in her knee.    Time 2   Period Weeks   Status On-going   PT SHORT TERM GOAL #4   Title Pt pain level to be no greater than a 5/10 in the past week    Time 2   Period Weeks   Status On-going   PT SHORT TERM GOAL #5   Title 2   Time 2   Period Weeks   Status Achieved           PT Long Term Goals - 05/12/15 1350    PT LONG TERM GOAL #1   Title Pt to be I in advance HEP   Time 4   Period Weeks   Status Achieved   PT LONG TERM GOAL #2   Title Pt ROM to be 2-115 to  allow pt to have a normalized gait and to be able to sit for an hour at a time with comfot to be able to dine   Baseline ROM6-99   Time 4   Period Weeks   Status On-going   PT LONG TERM GOAL #3   Title Pt strength to be at least 4+/5 to be able to walk for 30 mintues for better health habits.    Time 4  Period Weeks   Status Achieved   PT LONG TERM GOAL #4   Title Pt to be able to go up and down steps using one hand in a reciprical manner   Baseline just starting    Time 4   Period Weeks   Status On-going   PT LONG TERM GOAL #5   Title Pt pain to be no greater than a 3/10 to allow pt to sleep without waking from pain    Time 4   Period Weeks    Status On-going               Plan - 05/14/15 1320    Clinical Impression Statement Pt comes to department with an order for a JAS.  Pt measured for JAS, order and reassessment faxed to [place].  Pt treatment continues to focus on increasing flexion with manual and PROM techniques .   PT Next Visit Plan continue to work on improving ROM and manual.         Problem List Patient Active Problem List   Diagnosis Date Noted  . Primary osteoarthritis of right knee 04/05/2015  . Pain due to knee joint prosthesis ([place]) 04/05/2015  . Status post revision of total replacement of left knee 04/15/2013  . Pain due to total left knee replacement ([place]) 03/27/2013  . Muscle weakness (generalized) 07/02/2012  . Total knee replacement status 07/02/2012  . Difficulty in walking(719.7) 07/02/2012  . Stiffness of left knee 07/02/2012  . Pain in left knee 07/02/2012    Rayetta Humphrey, PT CLT [phone] 05/14/2015, 1:53 PM  [place] [address] [place], [place], [address] Phone: [phone]   Fax:  [phone]  Name: [name] MRN: [id number] Date of Birth: [birth year]

## 2015-05-17 ENCOUNTER — Ambulatory Visit (HOSPITAL_COMMUNITY): Payer: Medicare Other | Admitting: Physical Therapy

## 2015-05-17 DIAGNOSIS — M25661 Stiffness of right knee, not elsewhere classified: Secondary | ICD-10-CM | POA: Diagnosis not present

## 2015-05-17 DIAGNOSIS — M25561 Pain in right knee: Secondary | ICD-10-CM

## 2015-05-17 DIAGNOSIS — R29898 Other symptoms and signs involving the musculoskeletal system: Secondary | ICD-10-CM

## 2015-05-17 DIAGNOSIS — R262 Difficulty in walking, not elsewhere classified: Secondary | ICD-10-CM

## 2015-05-17 NOTE — Therapy (Signed)
[name], [place], [address] Phone: [phone]   Fax:  [phone]  Physical Therapy Treatment  Patient Details  Name: [name] MRN: [id number] Date of Birth: 1955/08/16 Referring Provider: Dr. Berenice Primas  Encounter Date: 05/17/2015      PT End of Session - 05/17/15 1344    Visit Number 10 progress note done on visit 8 for MD visit; Gcode on 10    Number of Visits 24   Date for PT Re-Evaluation 06/11/15   Authorization Type BCBS/medicare    PT Start Time 1302   PT Stop Time 1356   PT Time Calculation (min) 54 min   Activity Tolerance Patient tolerated treatment well   Behavior During Therapy [facility] for tasks assessed/performed      Past Medical History  Diagnosis Date  . Hyperlipidemia     takes Simvastatin nightly  . Seasonal allergies     in spring  . History of migraine     last one was on 06/02/12;takes Topamax bid and Imitrex prn  . Arthritis   . Joint pain   . Joint swelling   . Insomnia     takes Trazodone nightly  . Headache(784.0)   . Cancer [facility]) 1978     cervical cancer    Past Surgical History  Procedure Laterality Date  . Cyst removed from right knee   1972  . Left knee surgery  1973  . Abdominal hysterectomy  1978  . Right knee arthroscopy  90's     x 3  . Right knee partial replacement  1991  . Bladder tacked  1999  . Breast surgery  2009    cyst removed from left breast  . Right wrist sugery  2008  . Scar tissue removed  2008  . Appendectomy  2008  . Right wrist surgery    . Left knee surgery  2012  . Left knee surgery  2012  . Right carpal tunnel    . Colonoscopy    . Artroplasty    . Total knee arthroplasty  06/12/2012    LEFT KNEE  . Total knee arthroplasty  06/12/2012    Procedure: TOTAL KNEE ARTHROPLASTY;  Surgeon: Laurie Lights, MD;  Location: [place];  Service: Orthopedics;  Laterality: Left;  . Total knee revision Left 03/26/2013    Procedure: TOTAL KNEE  REVISION- left;  Surgeon: Laurie Corning, MD;  Location: [place];  Service: Orthopedics;  Laterality: Left;  . Total knee arthroplasty Right 04/05/2015  . Total knee revision Right 04/05/2015    Procedure: TOTAL KNEE REVISION;  Surgeon: Laurie Leitz, MD;  Location: [place];  Service: Orthopedics;  Laterality: Right;    There were no vitals filed for this visit.  Visit Diagnosis:  Stiffness of right knee  Pain in right knee  Weakness of right leg  Difficulty walking down step  Difficulty walking      Subjective Assessment - 05/17/15 1302    Subjective Pt states that her knee feels very stiff today.    Pertinent History pt had a cyst removed from her cartilage when she was 15; they removed her cyst with all of her cartilage, she has had multiple surgeries on her knee. [name] states that she had a right knee replacement on 04/05/2015; she was discharged on the 4th and referred to [facility].  She is now being referred to out-patient therapy to maximize her functional ability. She stopped her [place] on 04/23/2015.  She states the bend is not what it should be and she is having a lot of pain with it.  She has had pain in her knee since she was 15 and has been wearing a brace on her knee for the past 3 years.    How long can you sit comfortably? 05/12/2015 Pt is able to sit for 20 minutes with comfort now was five minutes.     How long can you stand comfortably? 05/12/2015 Pt is able to stand for five minutes before she has increased pain was five minutes    How long can you walk comfortably? walking with a cane outside; without inside.  The longest she has walked has been 30 minutes was five.    Patient Stated Goals less pain, better bend; to be able walk more better sleeping.( waking up every 3 hours.  unable to sleep past 4:00 o'clock.    Pain Score 2    Pain Location Knee   Pain Orientation Right   Pain Descriptors / Indicators Aching            [facility] PT Assessment - 05/17/15 0001    Assessment    Medical Diagnosis Rt TKR   Onset Date/Surgical Date 04/05/15   Next MD Visit 05/13/2015   Prior Therapy HH   Precautions   Precautions None   Restrictions   Weight Bearing Restrictions No   Home Environment   Living Environment Private residence   Type of [place] to enter   Entrance Stairs-Number of Steps 6   Entrance Stairs-Rails Can reach both   [place] One level   Prior Function   Level of Independence Independent   Vocation Full time employment   Leisure quilt    Cognition   Overall Cognitive Status Within Functional Limits for tasks assessed   Observation/Other Assessments   Focus on Therapeutic Outcomes (FOTO)  46  was 36    Functional Tests   Functional tests Single leg stance   Single Leg Stance   Comments Rt 1:25"";Lt 63"  initially Rt 20" Lt 40"    AROM   Right Knee Extension 3   Right Knee Flexion 97   Strength   Right Hip Flexion 5/5   Right Hip Extension 5/5  was 3/5    Right Hip ABduction 5/5  was 4+/5    Right Knee Flexion 5/5   Right Knee Extension 4/5  was 3/5    Right Ankle Dorsiflexion 5/5                     [facility] Adult PT Treatment/Exercise - 05/17/15 0001    Ambulation/Gait   Ambulation/Gait --   Ambulation/Gait Assistance --   Ambulation Distance (Feet) --   Assistive device --   Knee/Hip Exercises: Stretches   Passive Hamstring Stretch Right;60 seconds   Passive Hamstring Stretch Limitations long sit    Pension scheme manager reps;Other (comment)   Quad Stretch Limitations 2 minutes    Knee/Hip Exercises: Aerobic   Stationary Bike 10"   Knee/Hip Exercises: Standing   Stairs 2 RT   Knee/Hip Exercises: Seated   Heel Slides Right;10 reps   Knee/Hip Exercises: Supine   Quad Sets Right;10 reps   Heel Slides 10 reps   Terminal Knee Extension Strengthening;Right;10 reps   Knee Extension Limitations 3   Knee Flexion Limitations 99   Knee/Hip Exercises: Prone   Hamstring Curl 10 reps    Contract/Relax to Increase  Flexion x5   Prone Knee Hang Limitations quadriped positon hips back until stretch.    Straight Leg Raises Limitations terminal extension    Manual Therapy   Manual Therapy Passive ROM   Manual therapy comments patella mobs; PROM for both flexion and extension /A/P mobs                   PT Short Term Goals - 05/12/15 1349    PT SHORT TERM GOAL #1   Title I HEP   Time 1   Period Weeks   Status Achieved   PT SHORT TERM GOAL #2   Title ROM to improve to 4-110 to allow pt to be comfortable while sitting for 30 minutes.    Time 2   Period Weeks   Status On-going   PT SHORT TERM GOAL #3   Title quadricep strength to be 3+/5 to allow pt to be able to tolerate standing for 15 minutes to make a small meal.    Baseline 05/12/2015 Pt quadricep strength is there but pt is unable to stand due to pain in her knee.    Time 2   Period Weeks   Status On-going   PT SHORT TERM GOAL #4   Title Pt pain level to be no greater than a 5/10 in the past week    Time 2   Period Weeks   Status On-going   PT SHORT TERM GOAL #5   Title 2   Time 2   Period Weeks   Status Achieved           PT Long Term Goals - 05/12/15 1350    PT LONG TERM GOAL #1   Title Pt to be I in advance HEP   Time 4   Period Weeks   Status Achieved   PT LONG TERM GOAL #2   Title Pt ROM to be 2-115 to  allow pt to have a normalized gait and to be able to sit for an hour at a time with comfot to be able to dine   Baseline ROM6-99   Time 4   Period Weeks   Status On-going   PT LONG TERM GOAL #3   Title Pt strength to be at least 4+/5 to be able to walk for 30 mintues for better health habits.    Time 4   Period Weeks   Status Achieved   PT LONG TERM GOAL #4   Title Pt to be able to go up and down steps using one hand in a reciprical manner   Baseline just starting    Time 4   Period Weeks   Status On-going   PT LONG TERM GOAL #5   Title Pt pain to be no greater than a 3/10  to allow pt to sleep without waking from pain    Time 4   Period Weeks   Status On-going               Plan - 06-16-2015 1442    Clinical Impression Statement Pt states she feels stiffer today feels that it may be due to the weather.  Treatment focused on ROM with tight end feel in flexion.  Will await JAS brace; once this is acquired pt can reduce to one time a week to assess progress.    PT Next Visit Plan continue to work on improving ROM and manual.           G-Codes - 2015/06/16 1330  Functional Assessment Tool Used foto    Functional Limitation Mobility: Walking and moving around   Mobility: Walking and Moving Around Current Status [phone]) At least 40 percent but less than 60 percent impaired, limited or restricted   Mobility: Walking and Moving Around Goal Status [phone]) At least 40 percent but less than 60 percent impaired, limited or restricted      Problem List Patient Active Problem List   Diagnosis Date Noted  . Primary osteoarthritis of right knee 04/05/2015  . Pain due to knee joint prosthesis ([place]) 04/05/2015  . Status post revision of total replacement of left knee 04/15/2013  . Pain due to total left knee replacement ([place]) 03/27/2013  . Muscle weakness (generalized) 07/02/2012  . Total knee replacement status 07/02/2012  . Difficulty in walking(719.7) 07/02/2012  . Stiffness of left knee 07/02/2012  . Pain in left knee 07/02/2012    Rayetta Humphrey, PT CLT [phone] 05/17/2015, 2:45 PM  [facility] [address] [place], [place], [address] Phone: [phone]   Fax:  [phone]  Name: [name] MRN: [id number] Date of Birth: Mar 14, 1956

## 2015-05-19 ENCOUNTER — Ambulatory Visit (HOSPITAL_COMMUNITY): Payer: Medicare Other | Admitting: Physical Therapy

## 2015-05-19 DIAGNOSIS — M25661 Stiffness of right knee, not elsewhere classified: Secondary | ICD-10-CM

## 2015-05-19 DIAGNOSIS — R29898 Other symptoms and signs involving the musculoskeletal system: Secondary | ICD-10-CM

## 2015-05-19 DIAGNOSIS — Z96651 Presence of right artificial knee joint: Secondary | ICD-10-CM

## 2015-05-19 DIAGNOSIS — M25561 Pain in right knee: Secondary | ICD-10-CM

## 2015-05-19 DIAGNOSIS — R262 Difficulty in walking, not elsewhere classified: Secondary | ICD-10-CM

## 2015-05-19 NOTE — Therapy (Signed)
[place], [place], [address] Phone: [phone]   Fax:  [phone]  Physical Therapy Treatment  Patient Details  Name: [name] MRN: [id number] Date of Birth: [birth year] Referring Provider: Dr. Berenice Primas  Encounter Date: 05/19/2015      PT End of Session - 05/19/15 1457    Visit Number 12   Number of Visits 24   Date for PT Re-Evaluation 06/11/15   Authorization Type BCBS/medicare    PT Start Time 1308   PT Stop Time 1356   PT Time Calculation (min) 48 min   Activity Tolerance Patient tolerated treatment well   Behavior During Therapy [facility] for tasks assessed/performed      Past Medical History  Diagnosis Date  . Hyperlipidemia     takes Simvastatin nightly  . Seasonal allergies     in spring  . History of migraine     last one was on 06/02/12;takes Topamax bid and Imitrex prn  . Arthritis   . Joint pain   . Joint swelling   . Insomnia     takes Trazodone nightly  . Headache(784.0)   . Cancer [facility]) 1978     cervical cancer    Past Surgical History  Procedure Laterality Date  . Cyst removed from right knee   1972  . Left knee surgery  1973  . Abdominal hysterectomy  1978  . Right knee arthroscopy  90's     x 3  . Right knee partial replacement  1991  . Bladder tacked  1999  . Breast surgery  2009    cyst removed from left breast  . Right wrist sugery  2008  . Scar tissue removed  2008  . Appendectomy  2008  . Right wrist surgery    . Left knee surgery  2012  . Left knee surgery  2012  . Right carpal tunnel    . Colonoscopy    . Artroplasty    . Total knee arthroplasty  06/12/2012    LEFT KNEE  . Total knee arthroplasty  06/12/2012    Procedure: TOTAL KNEE ARTHROPLASTY;  Surgeon: Ninetta Lights, MD;  Location: [place];  Service: Orthopedics;  Laterality: Left;  . Total knee revision Left 03/26/2013    Procedure: TOTAL KNEE REVISION- left;  Surgeon: Alta Corning, MD;  Location: [place];  Service: Orthopedics;  Laterality: Left;  . Total knee arthroplasty Right 04/05/2015  . Total knee revision Right 04/05/2015    Procedure: TOTAL KNEE REVISION;  Surgeon: Dorna Leitz, MD;  Location: [place];  Service: Orthopedics;  Laterality: Right;    There were no vitals filed for this visit.  Visit Diagnosis:  Stiffness of right knee  Pain in right knee  Weakness of right leg  Difficulty walking down step  Difficulty walking  Status post total right knee replacement      Subjective Assessment - 05/19/15 1310    Subjective Patient reports that she is doing well today, having some significant pain and concerned about her flexion   Pertinent History pt had a cyst removed from her cartilage when she was 15; they removed her cyst with all of her cartilage, she has had multiple surgeries on her knee. [name] states that she had a right knee replacement on 04/05/2015; she was discharged on the 4th and referred to [facility].  She is now being referred to out-patient therapy to maximize her functional ability. She stopped her [place] on  04/23/2015.  She states the bend is not what it should be and she is having a lot of pain with it.  She has had pain in her knee since she was 15 and has been wearing a brace on her knee for the past 3 years.    Currently in Pain? Yes   Pain Location Knee   Pain Orientation Right                         [facility] Adult PT Treatment/Exercise - 05/19/15 1353    Knee/Hip Exercises: Stretches   Quad Stretch Right;3 reps;60 seconds   Knee: Self-Stretch Limitations 10 reps, 10 second holds    Press photographer Both;3 reps;30 seconds   Gastroc Stretch Limitations slantboard    Knee/Hip Exercises: Aerobic   Stationary Bike 10"   Knee/Hip Exercises: Supine   Quad Sets Right;10 reps   Heel Slides 10 reps   Manual Therapy   Manual Therapy Passive ROM;Myofascial release   Manual therapy comments to increase knee flexion   Myofascial Release Rt distal  quad and scar                  PT Short Term Goals - 05/12/15 1349    PT SHORT TERM GOAL #1   Title I HEP   Time 1   Period Weeks   Status Achieved   PT SHORT TERM GOAL #2   Title ROM to improve to 4-110 to allow pt to be comfortable while sitting for 30 minutes.    Time 2   Period Weeks   Status On-going   PT SHORT TERM GOAL #3   Title quadricep strength to be 3+/5 to allow pt to be able to tolerate standing for 15 minutes to make a small meal.    Baseline 05/12/2015 Pt quadricep strength is there but pt is unable to stand due to pain in her knee.    Time 2   Period Weeks   Status On-going   PT SHORT TERM GOAL #4   Title Pt pain level to be no greater than a 5/10 in the past week    Time 2   Period Weeks   Status On-going   PT SHORT TERM GOAL #5   Title 2   Time 2   Period Weeks   Status Achieved           PT Long Term Goals - 05/12/15 1350    PT LONG TERM GOAL #1   Title Pt to be I in advance HEP   Time 4   Period Weeks   Status Achieved   PT LONG TERM GOAL #2   Title Pt ROM to be 2-115 to  allow pt to have a normalized gait and to be able to sit for an hour at a time with comfot to be able to dine   Baseline ROM6-99   Time 4   Period Weeks   Status On-going   PT LONG TERM GOAL #3   Title Pt strength to be at least 4+/5 to be able to walk for 30 mintues for better health habits.    Time 4   Period Weeks   Status Achieved   PT LONG TERM GOAL #4   Title Pt to be able to go up and down steps using one hand in a reciprical manner   Baseline just starting    Time 4   Period Weeks   Status On-going  PT LONG TERM GOAL #5   Title Pt pain to be no greater than a 3/10 to allow pt to sleep without waking from pain    Time 4   Period Weeks   Status On-going               Plan - 05/19/15 1508    Clinical Impression Statement Focus mainly on decreasing adhesions as patient wtih tight end feel and indurated tight fascia superior knee.  PT with  call from [place] and hopes to get by end of week.  Ended session with bike at end of session to increase ROM after loosening fascia.     PT Next Visit Plan continue to work on improving ROM and manual. Follow up with JAS brace.         Problem List Patient Active Problem List   Diagnosis Date Noted  . Primary osteoarthritis of right knee 04/05/2015  . Pain due to knee joint prosthesis ([place]) 04/05/2015  . Status post revision of total replacement of left knee 04/15/2013  . Pain due to total left knee replacement ([place]) 03/27/2013  . Muscle weakness (generalized) 07/02/2012  . Total knee replacement status 07/02/2012  . Difficulty in walking(719.7) 07/02/2012  . Stiffness of left knee 07/02/2012  . Pain in left knee 07/02/2012    Teena Irani, PTA/CLT [phone] 05/19/2015, 3:12 PM  [facility] [address] [place], [place], [address] Phone: [phone]   Fax:  [phone]  Name: [name] MRN: [id number] Date of Birth: [birth year]

## 2015-05-21 ENCOUNTER — Ambulatory Visit (HOSPITAL_COMMUNITY): Payer: Medicare Other | Admitting: Physical Therapy

## 2015-05-21 DIAGNOSIS — M25561 Pain in right knee: Secondary | ICD-10-CM

## 2015-05-21 DIAGNOSIS — R262 Difficulty in walking, not elsewhere classified: Secondary | ICD-10-CM

## 2015-05-21 DIAGNOSIS — M6281 Muscle weakness (generalized): Secondary | ICD-10-CM

## 2015-05-21 DIAGNOSIS — Z96651 Presence of right artificial knee joint: Secondary | ICD-10-CM

## 2015-05-21 DIAGNOSIS — M25562 Pain in left knee: Secondary | ICD-10-CM

## 2015-05-21 DIAGNOSIS — M25662 Stiffness of left knee, not elsewhere classified: Secondary | ICD-10-CM

## 2015-05-21 DIAGNOSIS — M25661 Stiffness of right knee, not elsewhere classified: Secondary | ICD-10-CM

## 2015-05-21 DIAGNOSIS — R29898 Other symptoms and signs involving the musculoskeletal system: Secondary | ICD-10-CM

## 2015-05-21 NOTE — Therapy (Signed)
[place], [place], [address] Phone: [phone]   Fax:  [phone]  Physical Therapy Treatment  Patient Details  Name: [name] MRN: [id number] Date of Birth: 09/20/1955 Referring Provider: Dr. Berenice Primas  Encounter Date: 05/21/2015      PT End of Session - 05/21/15 1332    Visit Number 13   Number of Visits 24   Date for PT Re-Evaluation 06/11/15   Authorization Type BCBS/medicare    PT Start Time 1300   PT Stop Time 1355   PT Time Calculation (min) 55 min   Activity Tolerance Patient tolerated treatment well      Past Medical History  Diagnosis Date  . Hyperlipidemia     takes Simvastatin nightly  . Seasonal allergies     in spring  . History of migraine     last one was on 06/02/12;takes Topamax bid and Imitrex prn  . Arthritis   . Joint pain   . Joint swelling   . Insomnia     takes Trazodone nightly  . Headache(784.0)   . Cancer [facility]) 1978     cervical cancer    Past Surgical History  Procedure Laterality Date  . Cyst removed from right knee   1972  . Left knee surgery  1973  . Abdominal hysterectomy  1978  . Right knee arthroscopy  90's     x 3  . Right knee partial replacement  1991  . Bladder tacked  1999  . Breast surgery  2009    cyst removed from left breast  . Right wrist sugery  2008  . Scar tissue removed  2008  . Appendectomy  2008  . Right wrist surgery    . Left knee surgery  2012  . Left knee surgery  2012  . Right carpal tunnel    . Colonoscopy    . Artroplasty    . Total knee arthroplasty  06/12/2012    LEFT KNEE  . Total knee arthroplasty  06/12/2012    Procedure: TOTAL KNEE ARTHROPLASTY;  Surgeon: Ninetta Lights, MD;  Location: [place];  Service: Orthopedics;  Laterality: Left;  . Total knee revision Left 03/26/2013    Procedure: TOTAL KNEE REVISION- left;  Surgeon: Alta Corning, MD;  Location: [place];  Service: Orthopedics;  Laterality: Left;  . Total  knee arthroplasty Right 04/05/2015  . Total knee revision Right 04/05/2015    Procedure: TOTAL KNEE REVISION;  Surgeon: Dorna Leitz, MD;  Location: [place];  Service: Orthopedics;  Laterality: Right;    There were no vitals filed for this visit.  Visit Diagnosis:  Stiffness of right knee  Pain in right knee  Weakness of right leg  Difficulty walking down step  Status post total right knee replacement  Difficulty walking  Stiffness of left knee  Muscle weakness (generalized)  Pain in left knee      Subjective Assessment - 05/21/15 1300    Subjective Pt states that JAS called and gave her information about her co-pay.  Should recieve next week. She has recieved her bike she was able to go around yesterday but unable to go around today.  States that she is frustrated.     Pain Score 5    Pain Location Knee   Pain Orientation Right                         [facility] Adult PT  Treatment/Exercise - 05/21/15 0001    Knee/Hip Exercises: Stretches   Quad Stretch Right;3 reps;30 seconds   Knee: Self-Stretch to increase Flexion Right;3 reps;30 seconds   Knee/Hip Exercises: Machines for Strengthening   Other Machine Biodex PROM 70-90 degrees x 10 ' with manual     Knee/Hip Exercises: Prone   Hamstring Curl 15 reps   Hamstring Curl Limitations terminal flexion    Contract/Relax to Increase Flexion 10   Straight Leg Raises Limitations terminal extension    Manual Therapy   Manual Therapy Passive ROM;Myofascial release   Manual therapy comments to increase knee flexion   Soft tissue mobilization peripatellar                   PT Short Term Goals - 05/12/15 1349    PT SHORT TERM GOAL #1   Title I HEP   Time 1   Period Weeks   Status Achieved   PT SHORT TERM GOAL #2   Title ROM to improve to 4-110 to allow pt to be comfortable while sitting for 30 minutes.    Time 2   Period Weeks   Status On-going   PT SHORT TERM GOAL #3   Title quadricep strength to  be 3+/5 to allow pt to be able to tolerate standing for 15 minutes to make a small meal.    Baseline 05/12/2015 Pt quadricep strength is there but pt is unable to stand due to pain in her knee.    Time 2   Period Weeks   Status On-going   PT SHORT TERM GOAL #4   Title Pt pain level to be no greater than a 5/10 in the past week    Time 2   Period Weeks   Status On-going   PT SHORT TERM GOAL #5   Title 2   Time 2   Period Weeks   Status Achieved           PT Long Term Goals - 05/12/15 1350    PT LONG TERM GOAL #1   Title Pt to be I in advance HEP   Time 4   Period Weeks   Status Achieved   PT LONG TERM GOAL #2   Title Pt ROM to be 2-115 to  allow pt to have a normalized gait and to be able to sit for an hour at a time with comfot to be able to dine   Baseline ROM6-99   Time 4   Period Weeks   Status On-going   PT LONG TERM GOAL #3   Title Pt strength to be at least 4+/5 to be able to walk for 30 mintues for better health habits.    Time 4   Period Weeks   Status Achieved   PT LONG TERM GOAL #4   Title Pt to be able to go up and down steps using one hand in a reciprical manner   Baseline just starting    Time 4   Period Weeks   Status On-going   PT LONG TERM GOAL #5   Title Pt pain to be no greater than a 3/10 to allow pt to sleep without waking from pain    Time 4   Period Weeks   Status On-going               Plan - 05/21/15 1333    Clinical Impression Statement Pt is not progressing with flexion despite PROM.  Added biodex with manual with bilodex  PROM from 70-90 degrees.  Pt ended treatment with ice secondary to pushing end flexion on biodex    Pt will benefit from skilled therapeutic intervention in order to improve on the following deficits Abnormal gait;Decreased balance;Decreased endurance;Decreased range of motion;Decreased strength;Difficulty walking;Hypomobility;Increased edema;Increased fascial restricitons;Pain   Clinical Impairments Affecting  Rehab Potential 7 Surgeries total on this same knee leading to chronic periods of weakness.    PT Next Visit Plan Have Biodex be at the end of treatment and allow pt to stay on machine for as long as 30 minutes         Problem List Patient Active Problem List   Diagnosis Date Noted  . Primary osteoarthritis of right knee 04/05/2015  . Pain due to knee joint prosthesis ([place]) 04/05/2015  . Status post revision of total replacement of left knee 04/15/2013  . Pain due to total left knee replacement ([place]) 03/27/2013  . Muscle weakness (generalized) 07/02/2012  . Total knee replacement status 07/02/2012  . Difficulty in walking(719.7) 07/02/2012  . Stiffness of left knee 07/02/2012  . Pain in left knee 07/02/2012    Rayetta Humphrey, PT CLT [phone] 05/21/2015, 1:47 PM  [facility] [address] [place], [place], [address] Phone: [phone]   Fax:  [phone]  Name: [name] MRN: [id number] Date of Birth: 02-05-1956

## 2015-05-24 ENCOUNTER — Ambulatory Visit (HOSPITAL_COMMUNITY): Payer: Medicare Other | Admitting: Physical Therapy

## 2015-05-24 DIAGNOSIS — M25661 Stiffness of right knee, not elsewhere classified: Secondary | ICD-10-CM | POA: Diagnosis not present

## 2015-05-24 DIAGNOSIS — R262 Difficulty in walking, not elsewhere classified: Secondary | ICD-10-CM

## 2015-05-24 DIAGNOSIS — M25561 Pain in right knee: Secondary | ICD-10-CM

## 2015-05-24 DIAGNOSIS — Z96651 Presence of right artificial knee joint: Secondary | ICD-10-CM

## 2015-05-24 DIAGNOSIS — R29898 Other symptoms and signs involving the musculoskeletal system: Secondary | ICD-10-CM

## 2015-05-24 NOTE — Therapy (Signed)
[place], [place], [address] Phone: [phone]   Fax:  [phone]  Physical Therapy Treatment  Patient Details  Name: [name] MRN: [id number] Date of Birth: [birth year] Referring Provider: Dr. Berenice Primas  Encounter Date: 05/24/2015      PT End of Session - 05/24/15 1445    Visit Number 14   Number of Visits 24   Date for PT Re-Evaluation 06/11/15   Authorization Type BCBS/medicare    PT Start Time 1302   PT Stop Time 1408   PT Time Calculation (min) 66 min   Activity Tolerance Patient limited by pain   Behavior During Therapy [facility] for tasks assessed/performed      Past Medical History  Diagnosis Date  . Hyperlipidemia     takes Simvastatin nightly  . Seasonal allergies     in spring  . History of migraine     last one was on 06/02/12;takes Topamax bid and Imitrex prn  . Arthritis   . Joint pain   . Joint swelling   . Insomnia     takes Trazodone nightly  . Headache(784.0)   . Cancer [facility]) 1978     cervical cancer    Past Surgical History  Procedure Laterality Date  . Cyst removed from right knee   1972  . Left knee surgery  1973  . Abdominal hysterectomy  1978  . Right knee arthroscopy  90's     x 3  . Right knee partial replacement  1991  . Bladder tacked  1999  . Breast surgery  2009    cyst removed from left breast  . Right wrist sugery  2008  . Scar tissue removed  2008  . Appendectomy  2008  . Right wrist surgery    . Left knee surgery  2012  . Left knee surgery  2012  . Right carpal tunnel    . Colonoscopy    . Artroplasty    . Total knee arthroplasty  06/12/2012    LEFT KNEE  . Total knee arthroplasty  06/12/2012    Procedure: TOTAL KNEE ARTHROPLASTY;  Surgeon: Ninetta Lights, MD;  Location: [place];  Service: Orthopedics;  Laterality: Left;  . Total knee revision Left 03/26/2013    Procedure: TOTAL KNEE REVISION- left;  Surgeon: Alta Corning, MD;  Location: [place];   Service: Orthopedics;  Laterality: Left;  . Total knee arthroplasty Right 04/05/2015  . Total knee revision Right 04/05/2015    Procedure: TOTAL KNEE REVISION;  Surgeon: Dorna Leitz, MD;  Location: [place];  Service: Orthopedics;  Laterality: Right;    There were no vitals filed for this visit.  Visit Diagnosis:  Stiffness of right knee  Pain in right knee  Weakness of right leg  Difficulty walking down step  Status post total right knee replacement  Difficulty walking      Subjective Assessment - 05/24/15 1328    Subjective Pt states that she called [employer] who stated that they were waiting on insurance approval.    Pertinent History pt had a cyst removed from her cartilage when she was 15; they removed her cyst with all of her cartilage, she has had multiple surgeries on her knee. Ms. Dusseault states that she had a right knee replacement on 04/05/2015; she was discharged on the 4th and referred to [facility].  She is now being referred to out-patient therapy to maximize her functional ability. She stopped her [place] on 04/23/2015.  She states the bend is not what it should be and she is having a lot of pain with it.  She has had pain in her knee since she was 15 and has been wearing a brace on her knee for the past 3 years.    Currently in Pain? Yes   Pain Score 6    Pain Location Knee   Pain Orientation Right   Pain Descriptors / Indicators Aching   Pain Type Chronic pain                         [facility] Adult PT Treatment/Exercise - 05/24/15 0001    Knee/Hip Exercises: Stretches   Active Hamstring Stretch Right;1 rep;60 seconds   Active Hamstring Stretch Limitations Standing LE  on box 3x 30"   Quad Stretch Right;2 rep;60 seconds   Knee: Self-Stretch Limitations 10 reps, 10 second holds   onto 15" box    Knee/Hip Exercises: Machines for Strengthening   Other Machine Biodex PROM 70-90 degrees x 20' with manual     Knee/Hip Exercises: Standing   Heel Raises Right;15 reps    Functional Squat 10 reps   Functional Squat Limitations to pick up ball offf 6" step    Knee/Hip Exercises: Supine   Knee Extension Limitations 2   Knee Flexion Limitations 90    PROM.              PT Short Term Goals - 05/12/15 1349    PT SHORT TERM GOAL #1   Title I HEP   Time 1   Period Weeks   Status Achieved   PT SHORT TERM GOAL #2   Title ROM to improve to 4-110 to allow pt to be comfortable while sitting for 30 minutes.    Time 2   Period Weeks   Status On-going   PT SHORT TERM GOAL #3   Title quadricep strength to be 3+/5 to allow pt to be able to tolerate standing for 15 minutes to make a small meal.    Baseline 05/12/2015 Pt quadricep strength is there but pt is unable to stand due to pain in her knee.    Time 2   Period Weeks   Status On-going   PT SHORT TERM GOAL #4   Title Pt pain level to be no greater than a 5/10 in the past week    Time 2   Period Weeks   Status On-going   PT SHORT TERM GOAL #5   Title 2   Time 2   Period Weeks   Status Achieved           PT Long Term Goals - 05/12/15 1350    PT LONG TERM GOAL #1   Title Pt to be I in advance HEP   Time 4   Period Weeks   Status Achieved   PT LONG TERM GOAL #2   Title Pt ROM to be 2-115 to  allow pt to have a normalized gait and to be able to sit for an hour at a time with comfot to be able to dine   Baseline ROM6-99   Time 4   Period Weeks   Status On-going   PT LONG TERM GOAL #3   Title Pt strength to be at least 4+/5 to be able to walk for 30 mintues for better health habits.    Time 4   Period Weeks   Status Achieved   PT LONG TERM  GOAL #4   Title Pt to be able to go up and down steps using one hand in a reciprical manner   Baseline just starting    Time 4   Period Weeks   Status On-going   PT LONG TERM GOAL #5   Title Pt pain to be no greater than a 3/10 to allow pt to sleep without waking from pain    Time 4   Period Weeks   Status On-going                Plan - 05/24/15 1446    Clinical Impression Statement Pt flexion continues to decrease despite pt being vigilant on HEP.  Extension has decreased to 2 degrees.  Pt began functional squatting with manual cuing to keep weight evenly distributed on LE.  Pt placed on biodex at the end of treatment for prolong PROM with motion set at 70-90.   Pt will benefit from skilled therapeutic intervention in order to improve on the following deficits Abnormal gait;Decreased balance;Decreased endurance;Decreased range of motion;Decreased strength;Difficulty walking;Hypomobility;Increased edema;Increased fascial restricitons;Pain   Rehab Potential Good   PT Next Visit Plan Increase biodex to 30' if possible.  Begin stairs and continue with functional squats.         Problem List Patient Active Problem List   Diagnosis Date Noted  . Primary osteoarthritis of right knee 04/05/2015  . Pain due to knee joint prosthesis ([place]) 04/05/2015  . Status post revision of total replacement of left knee 04/15/2013  . Pain due to total left knee replacement ([place]) 03/27/2013  . Muscle weakness (generalized) 07/02/2012  . Total knee replacement status 07/02/2012  . Difficulty in walking(719.7) 07/02/2012  . Stiffness of left knee 07/02/2012  . Pain in left knee 07/02/2012    Rayetta Humphrey, PT CLT [phone] 05/24/2015, 2:50 PM  [place] [address] [place], [place], [address] Phone: [phone]   Fax:  [phone]  Name: JAMEA LAT MRN: [id number] Date of Birth: [birth year]

## 2015-05-26 ENCOUNTER — Ambulatory Visit (HOSPITAL_COMMUNITY): Payer: Medicare Other | Admitting: Physical Therapy

## 2015-05-26 DIAGNOSIS — M25661 Stiffness of right knee, not elsewhere classified: Secondary | ICD-10-CM

## 2015-05-26 DIAGNOSIS — R29898 Other symptoms and signs involving the musculoskeletal system: Secondary | ICD-10-CM

## 2015-05-26 DIAGNOSIS — Z96651 Presence of right artificial knee joint: Secondary | ICD-10-CM

## 2015-05-26 DIAGNOSIS — R262 Difficulty in walking, not elsewhere classified: Secondary | ICD-10-CM

## 2015-05-26 DIAGNOSIS — M25561 Pain in right knee: Secondary | ICD-10-CM

## 2015-05-26 NOTE — Therapy (Signed)
[place], [place], [address] Phone: [phone]   Fax:  [phone]  Physical Therapy Treatment  Patient Details  Name: [name] MRN: [id number] Date of Birth: Aug 27, 1955 Referring Provider: Dr. Berenice Primas  Encounter Date: 05/26/2015      PT End of Session - 05/26/15 1437    Visit Number 15   Number of Visits 24   Date for PT Re-Evaluation 06/11/15   Authorization Type BCBS/medicare    PT Start Time 1305   PT Stop Time 1350   PT Time Calculation (min) 45 min   Activity Tolerance Patient limited by pain   Behavior During Therapy [facility] for tasks assessed/performed      Past Medical History  Diagnosis Date  . Hyperlipidemia     takes Simvastatin nightly  . Seasonal allergies     in spring  . History of migraine     last one was on 06/02/12;takes Topamax bid and Imitrex prn  . Arthritis   . Joint pain   . Joint swelling   . Insomnia     takes Trazodone nightly  . Headache(784.0)   . Cancer [facility]) 1978     cervical cancer    Past Surgical History  Procedure Laterality Date  . Cyst removed from right knee   1972  . Left knee surgery  1973  . Abdominal hysterectomy  1978  . Right knee arthroscopy  90's     x 3  . Right knee partial replacement  1991  . Bladder tacked  1999  . Breast surgery  2009    cyst removed from left breast  . Right wrist sugery  2008  . Scar tissue removed  2008  . Appendectomy  2008  . Right wrist surgery    . Left knee surgery  2012  . Left knee surgery  2012  . Right carpal tunnel    . Colonoscopy    . Artroplasty    . Total knee arthroplasty  06/12/2012    LEFT KNEE  . Total knee arthroplasty  06/12/2012    Procedure: TOTAL KNEE ARTHROPLASTY;  Surgeon: Ninetta Lights, MD;  Location: [place];  Service: Orthopedics;  Laterality: Left;  . Total knee revision Left 03/26/2013    Procedure: TOTAL KNEE REVISION- left;  Surgeon: Alta Corning, MD;  Location: [place];   Service: Orthopedics;  Laterality: Left;  . Total knee arthroplasty Right 04/05/2015  . Total knee revision Right 04/05/2015    Procedure: TOTAL KNEE REVISION;  Surgeon: Dorna Leitz, MD;  Location: [place];  Service: Orthopedics;  Laterality: Right;    There were no vitals filed for this visit.  Visit Diagnosis:  Stiffness of right knee  Pain in right knee  Weakness of right leg  Difficulty walking down step  Status post total right knee replacement  Difficulty walking      Subjective Assessment - 05/26/15 1313    Subjective PT states she is having a bad day and believes the last treatment session flaired her up.  STates she continues to have difficulty sleeping at night.  8/10 today in distal quad /superior patellar region.    Currently in Pain? Yes   Pain Score 8    Pain Location Knee   Pain Orientation Right   Pain Descriptors / Indicators Aching;Constant   Pain Type Chronic pain  [place] Adult PT Treatment/Exercise - 05/26/15 1316    Knee/Hip Exercises: Stretches   Active Hamstring Stretch Right;1 rep;60 seconds   Active Hamstring Stretch Limitations standing on box    Knee: Self-Stretch Limitations 10 reps, 10 second holds    Press photographer Both;3 reps;30 seconds   Gastroc Stretch Limitations slantboard    Knee/Hip Exercises: Aerobic   Stationary Bike 10"   Knee/Hip Exercises: Supine   Knee Flexion PROM   Manual Therapy   Manual Therapy Passive ROM;Myofascial release   Manual therapy comments to increase knee flexion   Myofascial Release Rt distal quad and scar                  PT Short Term Goals - 05/12/15 1349    PT SHORT TERM GOAL #1   Title I HEP   Time 1   Period Weeks   Status Achieved   PT SHORT TERM GOAL #2   Title ROM to improve to 4-110 to allow pt to be comfortable while sitting for 30 minutes.    Time 2   Period Weeks   Status On-going   PT SHORT TERM GOAL #3   Title quadricep strength to be  3+/5 to allow pt to be able to tolerate standing for 15 minutes to make a small meal.    Baseline 05/12/2015 Pt quadricep strength is there but pt is unable to stand due to pain in her knee.    Time 2   Period Weeks   Status On-going   PT SHORT TERM GOAL #4   Title Pt pain level to be no greater than a 5/10 in the past week    Time 2   Period Weeks   Status On-going   PT SHORT TERM GOAL #5   Title 2   Time 2   Period Weeks   Status Achieved           PT Long Term Goals - 05/12/15 1350    PT LONG TERM GOAL #1   Title Pt to be I in advance HEP   Time 4   Period Weeks   Status Achieved   PT LONG TERM GOAL #2   Title Pt ROM to be 2-115 to  allow pt to have a normalized gait and to be able to sit for an hour at a time with comfot to be able to dine   Baseline ROM6-99   Time 4   Period Weeks   Status On-going   PT LONG TERM GOAL #3   Title Pt strength to be at least 4+/5 to be able to walk for 30 mintues for better health habits.    Time 4   Period Weeks   Status Achieved   PT LONG TERM GOAL #4   Title Pt to be able to go up and down steps using one hand in a reciprical manner   Baseline just starting    Time 4   Period Weeks   Status On-going   PT LONG TERM GOAL #5   Title Pt pain to be no greater than a 3/10 to allow pt to sleep without waking from pain    Time 4   Period Weeks   Status On-going               Plan - 05/26/15 1438    Clinical Impression Statement Focused session on decreasing pain today.  Completed stretches and completed manual in supine.  Gentle ROM/PROM completed with 2 therapist  assist to increase comfort.  Pt with hard endfeel with passive witout c/o pain.  Pt returns to MD next week to discuss manipulation; still has not received her [facility] brace.    Pt will benefit from skilled therapeutic intervention in order to improve on the following deficits Abnormal gait;Decreased balance;Decreased endurance;Decreased range of motion;Decreased  strength;Difficulty walking;Hypomobility;Increased edema;Increased fascial restricitons;Pain   Rehab Potential Good   PT Next Visit Plan Begin stairs and continue with functional squats.         Problem List Patient Active Problem List   Diagnosis Date Noted  . Primary osteoarthritis of right knee 04/05/2015  . Pain due to knee joint prosthesis ([place]) 04/05/2015  . Status post revision of total replacement of left knee 04/15/2013  . Pain due to total left knee replacement ([place]) 03/27/2013  . Muscle weakness (generalized) 07/02/2012  . Total knee replacement status 07/02/2012  . Difficulty in walking(719.7) 07/02/2012  . Stiffness of left knee 07/02/2012  . Pain in left knee 07/02/2012    Teena Irani, PTA/CLT [phone] 05/26/2015, 2:42 PM  [place] [address] [place], [place], [address] Phone: [phone]   Fax:  [phone]  Name: [name] MRN: [id number] Date of Birth: 1956/06/24

## 2015-05-31 ENCOUNTER — Ambulatory Visit (HOSPITAL_COMMUNITY): Payer: Medicare Other | Admitting: Physical Therapy

## 2015-05-31 DIAGNOSIS — R262 Difficulty in walking, not elsewhere classified: Secondary | ICD-10-CM

## 2015-05-31 DIAGNOSIS — R29898 Other symptoms and signs involving the musculoskeletal system: Secondary | ICD-10-CM

## 2015-05-31 DIAGNOSIS — M25561 Pain in right knee: Secondary | ICD-10-CM

## 2015-05-31 DIAGNOSIS — M25661 Stiffness of right knee, not elsewhere classified: Secondary | ICD-10-CM

## 2015-05-31 DIAGNOSIS — Z96651 Presence of right artificial knee joint: Secondary | ICD-10-CM

## 2015-05-31 NOTE — Therapy (Signed)
[place], [place], [address] Phone: [phone]   Fax:  [phone]  Physical Therapy Treatment  Patient Details  Name: [name] MRN: [id number] Date of Birth: [birth year] Referring Provider: Dr. Berenice Primas  Encounter Date: 05/31/2015      PT End of Session - 05/31/15 1342    Visit Number 16   Number of Visits 24   Date for PT Re-Evaluation 06/11/15   Authorization Type BCBS/medicare    PT Start Time 1300   PT Stop Time 1410   PT Time Calculation (min) 70 min   Activity Tolerance Patient tolerated treatment well   Behavior During Therapy [facility] for tasks assessed/performed      Past Medical History  Diagnosis Date  . Hyperlipidemia     takes Simvastatin nightly  . Seasonal allergies     in spring  . History of migraine     last one was on 06/02/12;takes Topamax bid and Imitrex prn  . Arthritis   . Joint pain   . Joint swelling   . Insomnia     takes Trazodone nightly  . Headache(784.0)   . Cancer [facility]) 1978     cervical cancer    Past Surgical History  Procedure Laterality Date  . Cyst removed from right knee   1972  . Left knee surgery  1973  . Abdominal hysterectomy  1978  . Right knee arthroscopy  90's     x 3  . Right knee partial replacement  1991  . Bladder tacked  1999  . Breast surgery  2009    cyst removed from left breast  . Right wrist sugery  2008  . Scar tissue removed  2008  . Appendectomy  2008  . Right wrist surgery    . Left knee surgery  2012  . Left knee surgery  2012  . Right carpal tunnel    . Colonoscopy    . Artroplasty    . Total knee arthroplasty  06/12/2012    LEFT KNEE  . Total knee arthroplasty  06/12/2012    Procedure: TOTAL KNEE ARTHROPLASTY;  Surgeon: Ninetta Lights, MD;  Location: [place];  Service: Orthopedics;  Laterality: Left;  . Total knee revision Left 03/26/2013    Procedure: TOTAL KNEE REVISION- left;  Surgeon: Alta Corning, MD;  Location: [place];  Service: Orthopedics;  Laterality: Left;  . Total knee arthroplasty Right 04/05/2015  . Total knee revision Right 04/05/2015    Procedure: TOTAL KNEE REVISION;  Surgeon: Dorna Leitz, MD;  Location: [place];  Service: Orthopedics;  Laterality: Right;    There were no vitals filed for this visit.  Visit Diagnosis:  Stiffness of right knee  Pain in right knee  Weakness of right leg  Difficulty walking down step  Status post total right knee replacement  Difficulty walking      Subjective Assessment - 05/31/15 1258    Subjective PT states she has been doing her Christmas decoration and she can really tell, (significant increased soreness)    How long can you sit comfortably? 05/12/2015 Pt is able to sit for 20 minutes with comfort . Now no change      How long can you stand comfortably? 05/12/2015 Pt is able to stand for five minutes  now 15 minutes    How long can you walk comfortably? walking without  a cane now; was walking inside without outside.   The longest she has  walked has been 30 minutes.     Patient Stated Goals less pain, better bend; to be able walk more better sleeping.( waking up every 3 hours.  unable to sleep past 4:00 o'clock.    Currently in Pain? Yes   Pain Score 5    Pain Location Knee   Pain Orientation Right   Pain Descriptors / Indicators Aching;Constant            [facility] PT Assessment - 05/31/15 0001    Assessment   Medical Diagnosis Rt TKR   Onset Date/Surgical Date 04/05/15   Next MD Visit 06/01/2015   Prior Therapy HH   Precautions   Precautions None   Restrictions   Weight Bearing Restrictions No   Home Environment   Living Environment Private residence   Type of [place] to enter   Entrance Stairs-Number of Steps 6   Entrance Stairs-Rails Can reach both   [place] One level   Prior Function   Level of Independence Independent   Vocation Full time employment   Leisure quilt    Cognition   Overall Cognitive  Status Within Functional Limits for tasks assessed   Observation/Other Assessments   Focus on Therapeutic Outcomes (FOTO)  --  was 36    Functional Tests   Functional tests Single leg stance   Single Leg Stance   Comments Rt 1:25"";Lt 63"  initially Rt 20" Lt 40"    AROM   Right Knee Extension 2  was 3   Right Knee Flexion 92   Strength   Right Hip Flexion 5/5   Right Hip Extension 5/5  was 3/5    Right Hip ABduction 5/5  was 4+/5    Right Knee Flexion 5/5   Right Knee Extension 4/5  was 4/5 in available range; pt has poor terminal extension   Right Ankle Dorsiflexion 5/5                     [facility] Adult PT Treatment/Exercise - 05/31/15 0001    Knee/Hip Exercises: Stretches   Sports administrator Right;3 reps;30 seconds   Knee: Self-Stretch to increase Flexion Right;3 reps;30 seconds   Knee/Hip Exercises: Machines for Strengthening   Other Machine Biodex PROM 70-90 degrees x 20' with manual     Knee/Hip Exercises: Standing   Heel Raises 10 reps   Functional Squat 10 reps   Functional Squat Limitations to pick up ball offf 6" step    Knee/Hip Exercises: Supine   Quad Sets 10 reps   Terminal Knee Extension Right;10 reps   Knee Extension Limitations 2   Knee Flexion Limitations 92                PT Education - 05/31/15 1342    Education provided No          PT Short Term Goals - 05/12/15 1349    PT SHORT TERM GOAL #1   Title I HEP   Time 1   Period Weeks   Status Achieved   PT SHORT TERM GOAL #2   Title ROM to improve to 4-110 to allow pt to be comfortable while sitting for 30 minutes.    Time 2   Period Weeks   Status On-going   PT SHORT TERM GOAL #3   Title quadricep strength to be 3+/5 to allow pt to be able to tolerate standing for 15 minutes to make a small meal.    Baseline 05/12/2015  Pt quadricep strength is there but pt is unable to stand due to pain in her knee.    Time 2   Period Weeks   Status On-going   PT SHORT TERM GOAL #4    Title Pt pain level to be no greater than a 5/10 in the past week    Time 2   Period Weeks   Status On-going   PT SHORT TERM GOAL #5   Title 2   Time 2   Period Weeks   Status Achieved           PT Long Term Goals - 05/12/15 1350    PT LONG TERM GOAL #1   Title Pt to be I in advance HEP   Time 4   Period Weeks   Status Achieved   PT LONG TERM GOAL #2   Title Pt ROM to be 2-115 to  allow pt to have a normalized gait and to be able to sit for an hour at a time with comfot to be able to dine   Baseline ROM6-99   Time 4   Period Weeks   Status On-going   PT LONG TERM GOAL #3   Title Pt strength to be at least 4+/5 to be able to walk for 30 mintues for better health habits.    Time 4   Period Weeks   Status Achieved   PT LONG TERM GOAL #4   Title Pt to be able to go up and down steps using one hand in a reciprical manner   Baseline just starting    Time 4   Period Weeks   Status On-going   PT LONG TERM GOAL #5   Title Pt pain to be no greater than a 3/10 to allow pt to sleep without waking from pain    Time 4   Period Weeks   Status On-going               Plan - 05/31/15 1343    Clinical Impression Statement Pt to MD tomorrow.  Pt has made minimal gains in the past two weeks.  Benefit of skilled care may have maximized at this time.  Pt still has not recieved any information about her J[facility]    PT Next Visit Plan Pt to MD to assess if skilled physical therapy has maximized her benefits.         Problem List Patient Active Problem List   Diagnosis Date Noted  . Primary osteoarthritis of right knee 04/05/2015  . Pain due to knee joint prosthesis ([place]) 04/05/2015  . Status post revision of total replacement of left knee 04/15/2013  . Pain due to total left knee replacement ([place]) 03/27/2013  . Muscle weakness (generalized) 07/02/2012  . Total knee replacement status 07/02/2012  . Difficulty in walking(719.7) 07/02/2012  . Stiffness of left knee 07/02/2012   . Pain in left knee 07/02/2012    Rayetta Humphrey, PT CLT [phone] 05/31/2015, 1:50 PM  [place] [address] [place], [place], [address] Phone: [phone]   Fax:  [phone]  Name: [name] MRN: [id number] Date of Birth: 12-08-1955  Physical Therapy Progress Note  Dates of Reporting Period: 05/17/2015 to 05/29/2015  Objective Reports of Subjective Statement: no significant improvement.  Objective Measurements: 2-92  Goal Update: see above   Plan: Pt JAS has been ordered but she has not heard anything from the company  Reason Skilled Services are Required: questionable if  skilled therapy is still needed.

## 2015-06-02 ENCOUNTER — Encounter (HOSPITAL_COMMUNITY): Payer: Medicare Other | Admitting: Physical Therapy

## 2015-06-02 ENCOUNTER — Other Ambulatory Visit: Payer: Self-pay | Admitting: Orthopedic Surgery

## 2015-06-02 ENCOUNTER — Encounter (HOSPITAL_BASED_OUTPATIENT_CLINIC_OR_DEPARTMENT_OTHER): Payer: Self-pay | Admitting: *Deleted

## 2015-06-04 ENCOUNTER — Encounter (HOSPITAL_COMMUNITY): Payer: Medicare Other | Admitting: Physical Therapy

## 2015-06-07 ENCOUNTER — Ambulatory Visit (HOSPITAL_BASED_OUTPATIENT_CLINIC_OR_DEPARTMENT_OTHER): Payer: Medicare Other | Admitting: Certified Registered"

## 2015-06-07 ENCOUNTER — Encounter (HOSPITAL_COMMUNITY): Payer: Medicare Other | Admitting: Physical Therapy

## 2015-06-07 ENCOUNTER — Encounter (HOSPITAL_BASED_OUTPATIENT_CLINIC_OR_DEPARTMENT_OTHER)
Admission: RE | Disposition: A | Discharge status: Home / Self Care | Payer: Self-pay | Source: Ambulatory Visit | Attending: Orthopedic Surgery

## 2015-06-07 ENCOUNTER — Encounter (HOSPITAL_BASED_OUTPATIENT_CLINIC_OR_DEPARTMENT_OTHER): Payer: Self-pay | Admitting: Certified Registered"

## 2015-06-07 ENCOUNTER — Ambulatory Visit (HOSPITAL_BASED_OUTPATIENT_CLINIC_OR_DEPARTMENT_OTHER)
Admission: RE | Admit: 2015-06-07 | Discharge: 2015-06-07 | Disposition: A | Discharge status: Home / Self Care | Payer: Medicare Other | Source: Ambulatory Visit | Attending: Orthopedic Surgery | Admitting: Orthopedic Surgery

## 2015-06-07 DIAGNOSIS — Z96653 Presence of artificial knee joint, bilateral: Secondary | ICD-10-CM | POA: Insufficient documentation

## 2015-06-07 DIAGNOSIS — M24661 Ankylosis, right knee: Secondary | ICD-10-CM | POA: Diagnosis not present

## 2015-06-07 DIAGNOSIS — M25561 Pain in right knee: Secondary | ICD-10-CM | POA: Diagnosis present

## 2015-06-07 DIAGNOSIS — Z79899 Other long term (current) drug therapy: Secondary | ICD-10-CM | POA: Insufficient documentation

## 2015-06-07 DIAGNOSIS — Z96651 Presence of right artificial knee joint: Secondary | ICD-10-CM

## 2015-06-07 DIAGNOSIS — Z87891 Personal history of nicotine dependence: Secondary | ICD-10-CM | POA: Diagnosis not present

## 2015-06-07 DIAGNOSIS — Z7982 Long term (current) use of aspirin: Secondary | ICD-10-CM | POA: Insufficient documentation

## 2015-06-07 DIAGNOSIS — M24669 Ankylosis, unspecified knee: Secondary | ICD-10-CM

## 2015-06-07 DIAGNOSIS — M1990 Unspecified osteoarthritis, unspecified site: Secondary | ICD-10-CM | POA: Insufficient documentation

## 2015-06-07 DIAGNOSIS — E785 Hyperlipidemia, unspecified: Secondary | ICD-10-CM | POA: Insufficient documentation

## 2015-06-07 DIAGNOSIS — Z79891 Long term (current) use of opiate analgesic: Secondary | ICD-10-CM | POA: Diagnosis not present

## 2015-06-07 HISTORY — PX: KNEE CLOSED REDUCTION: SHX995

## 2015-06-07 SURGERY — MANIPULATION, KNEE, CLOSED
Anesthesia: General | Site: Knee | Laterality: Right

## 2015-06-07 MED ORDER — HYDROMORPHONE HCL 1 MG/ML IJ SOLN
0.2500 mg | INTRAMUSCULAR | Status: DC | PRN
  Administered 2015-06-07 (×4): 0.5 mg via INTRAVENOUS

## 2015-06-07 MED ORDER — ONDANSETRON HCL 4 MG/2ML IJ SOLN
INTRAMUSCULAR | Status: DC | PRN
  Administered 2015-06-07: 4 mg via INTRAVENOUS

## 2015-06-07 MED ORDER — MIDAZOLAM HCL 5 MG/5ML IJ SOLN
INTRAMUSCULAR | Status: DC | PRN
  Administered 2015-06-07: 2 mg via INTRAVENOUS

## 2015-06-07 MED ORDER — ONDANSETRON HCL 4 MG/2ML IJ SOLN
INTRAMUSCULAR | Status: AC
  Filled 2015-06-07: qty 2

## 2015-06-07 MED ORDER — ONDANSETRON HCL 4 MG/2ML IJ SOLN: 4.0000 mg | Freq: Four times a day (QID) | INTRAMUSCULAR | Status: DC | PRN

## 2015-06-07 MED ORDER — SCOPOLAMINE 1 MG/3DAYS TD PT72: 1.0000 | MEDICATED_PATCH | Freq: Once | TRANSDERMAL | Status: DC | PRN

## 2015-06-07 MED ORDER — MIDAZOLAM HCL 2 MG/2ML IJ SOLN: 1.0000 mg | INTRAMUSCULAR | Status: DC | PRN

## 2015-06-07 MED ORDER — FENTANYL CITRATE (PF) 100 MCG/2ML IJ SOLN
INTRAMUSCULAR | Status: AC
  Filled 2015-06-07: qty 2

## 2015-06-07 MED ORDER — BUPIVACAINE-EPINEPHRINE (PF) 0.5% -1:200000 IJ SOLN
INTRAMUSCULAR | Status: DC | PRN
  Administered 2015-06-07: 30 mL via PERINEURAL

## 2015-06-07 MED ORDER — OXYCODONE HCL 5 MG/5ML PO SOLN: 5.0000 mg | Freq: Once | ORAL | Status: AC | PRN

## 2015-06-07 MED ORDER — HYDROMORPHONE HCL 1 MG/ML IJ SOLN
INTRAMUSCULAR | Status: AC
Start: 2015-06-07 — End: 2015-06-07
  Filled 2015-06-07: qty 1

## 2015-06-07 MED ORDER — BUPIVACAINE HCL (PF) 0.5 % IJ SOLN
INTRAMUSCULAR | Status: AC
  Filled 2015-06-07: qty 30

## 2015-06-07 MED ORDER — OXYCODONE HCL 5 MG PO TABS
5.0000 mg | ORAL_TABLET | Freq: Once | ORAL | Status: AC | PRN
  Administered 2015-06-07: 5 mg via ORAL

## 2015-06-07 MED ORDER — LACTATED RINGERS IV SOLN: INTRAVENOUS | Status: DC

## 2015-06-07 MED ORDER — CEFAZOLIN SODIUM-DEXTROSE 2-3 GM-% IV SOLR
2.0000 g | INTRAVENOUS | Status: AC
  Administered 2015-06-07: 2 g via INTRAVENOUS

## 2015-06-07 MED ORDER — LIDOCAINE HCL (CARDIAC) 20 MG/ML IV SOLN
INTRAVENOUS | Status: DC | PRN
  Administered 2015-06-07: 30 mg via INTRAVENOUS

## 2015-06-07 MED ORDER — CEFAZOLIN SODIUM-DEXTROSE 2-3 GM-% IV SOLR
INTRAVENOUS | Status: AC
  Filled 2015-06-07: qty 50

## 2015-06-07 MED ORDER — LIDOCAINE HCL (CARDIAC) 20 MG/ML IV SOLN
INTRAVENOUS | Status: AC
  Filled 2015-06-07: qty 5

## 2015-06-07 MED ORDER — OXYCODONE HCL 5 MG PO TABS
ORAL_TABLET | ORAL | Status: AC
  Filled 2015-06-07: qty 1

## 2015-06-07 MED ORDER — GLYCOPYRROLATE 0.2 MG/ML IJ SOLN: 0.2000 mg | Freq: Once | INTRAMUSCULAR | Status: DC | PRN

## 2015-06-07 MED ORDER — BUPIVACAINE-EPINEPHRINE (PF) 0.5% -1:200000 IJ SOLN
INTRAMUSCULAR | Status: AC
  Filled 2015-06-07: qty 30

## 2015-06-07 MED ORDER — LACTATED RINGERS IV SOLN
INTRAVENOUS | Status: DC
  Administered 2015-06-07: 10 mL/h via INTRAVENOUS

## 2015-06-07 MED ORDER — HYDROMORPHONE HCL 1 MG/ML IJ SOLN
INTRAMUSCULAR | Status: AC
  Filled 2015-06-07: qty 1

## 2015-06-07 MED ORDER — PROPOFOL 10 MG/ML IV BOLUS
INTRAVENOUS | Status: DC | PRN
  Administered 2015-06-07: 200 mg via INTRAVENOUS

## 2015-06-07 MED ORDER — PROPOFOL 10 MG/ML IV BOLUS
INTRAVENOUS | Status: AC
  Filled 2015-06-07: qty 20

## 2015-06-07 MED ORDER — FENTANYL CITRATE (PF) 100 MCG/2ML IJ SOLN: 50.0000 ug | INTRAMUSCULAR | Status: DC | PRN

## 2015-06-07 MED ORDER — FENTANYL CITRATE (PF) 100 MCG/2ML IJ SOLN
50.0000 ug | INTRAMUSCULAR | Status: DC | PRN
  Administered 2015-06-07: 100 ug via INTRAVENOUS

## 2015-06-07 MED ORDER — MIDAZOLAM HCL 2 MG/2ML IJ SOLN
INTRAMUSCULAR | Status: AC
  Filled 2015-06-07: qty 2

## 2015-06-07 MED ORDER — CHLORHEXIDINE GLUCONATE 4 % EX LIQD: 60.0000 mL | Freq: Once | CUTANEOUS | Status: DC

## 2015-06-07 MED ORDER — BUPIVACAINE-EPINEPHRINE 0.5% -1:200000 IJ SOLN
INTRAMUSCULAR | Status: DC | PRN
  Administered 2015-06-07: 10 mL

## 2015-06-07 SURGICAL SUPPLY — 17 items
BANDAGE ADH SHEER 1  50/CT (GAUZE/BANDAGES/DRESSINGS) ×2 IMPLANT
DECANTER SPIKE VIAL GLASS SM (MISCELLANEOUS) IMPLANT
GLOVE BIOGEL PI IND STRL 8 (GLOVE) ×2 IMPLANT
GLOVE BIOGEL PI INDICATOR 8 (GLOVE) ×2
GLOVE ECLIPSE 7.5 STRL STRAW (GLOVE) ×2 IMPLANT
KNEE WRAP E Z 3 GEL PACK (MISCELLANEOUS) IMPLANT
NDL HYPO 25X1 1.5 SAFETY (NEEDLE) IMPLANT
NDL SAFETY ECLIPSE 18X1.5 (NEEDLE) ×1 IMPLANT
NDL SPNL 22GX3.5 QUINCKE BK (NEEDLE) IMPLANT
NEEDLE HYPO 18GX1.5 SHARP (NEEDLE) ×2
NEEDLE HYPO 22GX1.5 SAFETY (NEEDLE) ×2 IMPLANT
NEEDLE HYPO 25X1 1.5 SAFETY (NEEDLE) IMPLANT
NEEDLE SPNL 22GX3.5 QUINCKE BK (NEEDLE) IMPLANT
PAD ALCOHOL SWAB (MISCELLANEOUS) ×4 IMPLANT
SPONGE GAUZE 4X4 12PLY STER LF (GAUZE/BANDAGES/DRESSINGS) IMPLANT
SWABSTICK POVIDONE IODINE SNGL (MISCELLANEOUS) IMPLANT
SYR CONTROL 10ML LL (SYRINGE) ×4 IMPLANT

## 2015-06-07 NOTE — Anesthesia Postprocedure Evaluation (Signed)
Anesthesia Post Note  Patient: [name]  Procedure(s) Performed: Procedure(s) (LRB): CLOSED MANIPULATION RIGHT KNEE (Right)  Patient location during evaluation: PACU Anesthesia Type: General Level of consciousness: awake and alert and patient cooperative Pain management: pain level controlled Vital Signs Assessment: post-procedure vital signs reviewed and stable Respiratory status: spontaneous breathing and respiratory function stable Cardiovascular status: stable Anesthetic complications: no    Last Vitals:  Filed Vitals:   06/07/15 1236 06/07/15 1245  BP:  118/69  Pulse: 104 88  Temp:    Resp: 26 12    Last Pain:  Filed Vitals:   06/07/15 1252  PainSc: 8         RLE Motor Response: Purposeful movement (06/07/15 1245) RLE Sensation: Full sensation (06/07/15 1245)      [place]

## 2015-06-07 NOTE — Discharge Instructions (Signed)
Attend PT daily this week. Use CPM 6-8 hrs per day set at  0 degrees to 90 degrees.increase up to 110 degrees over the next few days.    Post Anesthesia Home Care Instructions  Activity: Get plenty of rest for the remainder of the day. A responsible adult should stay with you for 24 hours following the procedure.  For the next 24 hours, DO NOT: -Drive a car -Paediatric nurse -Drink alcoholic beverages -Take any medication unless instructed by your physician -Make any legal decisions or sign important papers.  Meals: Start with liquid foods such as gelatin or soup. Progress to regular foods as tolerated. Avoid greasy, spicy, heavy foods. If nausea and/or vomiting occur, drink only clear liquids until the nausea and/or vomiting subsides. Call your physician if vomiting continues.  Special Instructions/Symptoms: Your throat may feel dry or sore from the anesthesia or the breathing tube placed in your throat during surgery. If this causes discomfort, gargle with warm salt water. The discomfort should disappear within 24 hours.  If you had a scopolamine patch placed behind your ear for the management of post- operative nausea and/or vomiting:  1. The medication in the patch is effective for 72 hours, after which it should be removed.  Wrap patch in a tissue and discard in the trash. Wash hands thoroughly with soap and water. 2. You may remove the patch earlier than 72 hours if you experience unpleasant side effects which may include dry mouth, dizziness or visual disturbances. 3. Avoid touching the patch. Wash your hands with soap and water after contact with the patch.   Regional Anesthesia Blocks  1. Numbness or the inability to move the "blocked" extremity may last from 3-48 hours after placement. The length of time depends on the medication injected and your individual response to the medication. If the numbness is not going away after 48 hours, call your surgeon.  2. The extremity  that is blocked will need to be protected until the numbness is gone and the  Strength has returned. Because you cannot feel it, you will need to take extra care to avoid injury. Because it may be weak, you may have difficulty moving it or using it. You may not know what position it is in without looking at it while the block is in effect.  3. For blocks in the legs and feet, returning to weight bearing and walking needs to be done carefully. You will need to wait until the numbness is entirely gone and the strength has returned. You should be able to move your leg and foot normally before you try and bear weight or walk. You will need someone to be with you when you first try to ensure you do not fall and possibly risk injury.  4. Bruising and tenderness at the needle site are common side effects and will resolve in a few days.  5. Persistent numbness or new problems with movement should be communicated to the surgeon or the [facility] [phone] [place] [phone]).

## 2015-06-07 NOTE — H&P (Signed)
PREOPERATIVE H&P  Chief Complaint: r knee pain and stiffness after r TKR  HPI: [name] is a 59 y.o. female who presents for evaluation of r knee pain and stiffness after TKR. It has been present for 6 weeks and has been worsening. She has failed conservative measures. Pain is rated as moderate.  Past Medical History  Diagnosis Date  . Hyperlipidemia     takes Simvastatin nightly  . Seasonal allergies     in spring  . History of migraine     last one was on 06/02/12;takes Topamax bid and Imitrex prn  . Arthritis   . Joint pain   . Joint swelling   . Insomnia     takes Trazodone nightly  . Headache(784.0)   . Cancer [facility] [employer]) 1978     cervical cancer   Past Surgical History  Procedure Laterality Date  . Cyst removed from right knee   1972  . Left knee surgery  1973  . Abdominal hysterectomy  1978  . Right knee arthroscopy  90's     x 3  . Right knee partial replacement  1991  . Bladder tacked  1999  . Breast surgery  2009    cyst removed from left breast  . Right wrist sugery  2008  . Scar tissue removed  2008  . Appendectomy  2008  . Right wrist surgery    . Left knee surgery  2012  . Left knee surgery  2012  . Right carpal tunnel    . Colonoscopy    . Artroplasty    . Total knee arthroplasty  06/12/2012    LEFT KNEE  . Total knee arthroplasty  06/12/2012    Procedure: TOTAL KNEE ARTHROPLASTY;  Surgeon: Ninetta Lights, MD;  Location: [facility];  Service: Orthopedics;  Laterality: Left;  . Total knee revision Left 03/26/2013    Procedure: TOTAL KNEE REVISION- left;  Surgeon: Alta Corning, MD;  Location: [facility];  Service: Orthopedics;  Laterality: Left;  . Total knee arthroplasty Right 04/05/2015  . Total knee revision Right 04/05/2015    Procedure: TOTAL KNEE REVISION;  Surgeon: Dorna Leitz, MD;  Location: [facility];  Service: Orthopedics;  Laterality: Right;   Social History   Social History  . Marital Status: Married    Spouse Name: N/A  . Number of Children:  N/A  . Years of Education: N/A   Social History Main Topics  . Smoking status: Former Smoker    Quit date: 06/12/2000  . Smokeless tobacco: Never Used     Comment: quit 10+yrs ago  . Alcohol Use: No  . Drug Use: No  . Sexual Activity: Yes    Birth Control/ Protection: Surgical   Other Topics Concern  . None   Social History Narrative   History reviewed. No pertinent family history. Allergies  Allergen Reactions  . Mupirocin Other (See Comments)    Bumps on tongue and thickness to tongue    Prior to Admission medications   Medication Sig Start Date End Date Taking? Authorizing Provider  aspirin EC 325 MG tablet Take 1 tablet (325 mg total) by mouth 2 (two) times daily after a meal. Take x 1 month post op to decrease risk of blood clots. 04/05/15  Yes Gary Fleet, PA-C  oxyCODONE-acetaminophen (PERCOCET/ROXICET) 5-325 MG tablet Take 1-2 tablets by mouth every 4 (four) hours as needed for severe pain. 04/05/15  Yes Gary Fleet, PA-C  SUMAtriptan (IMITREX) 100 MG tablet Take 100 mg by  mouth every 2 (two) hours as needed for migraine.    Yes Historical Provider, MD  tiZANidine (ZANAFLEX) 2 MG tablet Take 1 tablet (2 mg total) by mouth every 6 (six) hours as needed for muscle spasms. 04/05/15  Yes Gary Fleet, PA-C  topiramate (TOPAMAX) 50 MG tablet Take 50 mg by mouth 2 (two) times daily.   Yes Historical Provider, MD  traZODone (DESYREL) 50 MG tablet Take 100 mg by mouth at bedtime.   Yes Historical Provider, MD     Positive ROS: none  All other systems have been reviewed and were otherwise negative with the exception of those mentioned in the HPI and as above.  Physical Exam: Filed Vitals:   06/07/15 1139  BP: 143/70  Pulse: 72  Temp: 98.4 F (36.9 C)  Resp: 20    General: Alert, no acute distress Cardiovascular: No pedal edema Respiratory: No cyanosis, no use of accessory musculature GI: No organomegaly, abdomen is soft and non-tender Skin: No lesions in the area  of chief complaint Neurologic: Sensation intact distally Psychiatric: Patient is competent for consent with normal mood and affect Lymphatic: No axillary or cervical lymphadenopathy  MUSCULOSKELETAL: r knee rom 10-70 deg trace effusiion and painful rom  Assessment/Plan: ARTHROFIBROSIS RIGHT KNEE  Plan for Procedure(s): CLOSED MANIPULATION RIGHT KNEE  The risks benefits and alternatives were discussed with the patient including but not limited to the risks of nonoperative treatment, versus surgical intervention including infection, bleeding, nerve injury, malunion, nonunion, hardware prominence, hardware failure, need for hardware removal, blood clots, cardiopulmonary complications, morbidity, mortality, among others, and they were willing to proceed.  Predicted outcome is good, although there will be at least a six to nine month expected recovery.  Elke Holtry L, MD 06/07/2015 12:01 PM

## 2015-06-07 NOTE — Op Note (Signed)
[name], [name]              ACCOUNT NO.:  [id number]  MEDICAL RECORD NO.:  [id number]  LOCATION:  [facility]                        FACILITY:  [facility]  PHYSICIAN:  Alta Corning, M.D.   DATE OF BIRTH:  1955-11-10  DATE OF PROCEDURE:  06/07/2015 DATE OF DISCHARGE:                              OPERATIVE REPORT   PREOPERATIVE DIAGNOSIS:  Painful, stiff total knee on the right side, status post total knee replacement, now 5 weeks out.  POSTOPERATIVE DIAGNOSIS:  Painful, stiff total knee on the right side, status post total knee replacement, now 5 weeks out.  PROCEDURES: 1. Right knee manipulation. 2. Injection of right knee with 10 mL of Marcaine with 1:100,000     epinephrine for postoperative pain and swelling control.  BRIEF HISTORY:  Ms. [name] is a 59 year old female with history of having had a left total knee replacement she had over the years, but complained of pain.  When I first met her, she had a significantly loose painful knee on the left side.  She was having recurrent effusions.  We ultimately revised that and tightened this knee up quite a lot with a bigger spacer.  She did well after that procedure, but we kind of noticed over time that she loosened this up as well.  She had had a previous patellofemoral arthroplasty on the right side and went to bone- on-bone in the weightbearing area.  Because of this, we elected to convert this to a total knee replacement.  She was brought to the operating room for this procedure.  She tolerated that procedure well, initially was bending and straightening quite well postsurgery, but over time, this began to get worse.  It was unclear exactly why this was happening, but we tried medications and other things and none of that seemed to work.  As of the date of surgery, range of motion was about 10- 60 degrees and we felt that manipulation was appropriate.  She was brought to the operating room for this procedure.  DESCRIPTION  OF PROCEDURE:  The patient was brought to the operating room.  After adequate anesthesia was obtained with general anesthetic, the patient was placed supine on the operating table.  The right leg was then examined under anesthesia, really had a range of motion about 10-60 under complete anesthetic.  We worked on trying to straighten her first cut, we could get her flap, but it was little bit spurring, but did come out to flat at least.  We then did a manipulative reduction.  We really tight to begin with, but the ultimately scar tissue did kind of release and when this happened, we were able to get her back to 140 degrees of flexion.  At that point, we looked at what her easy gravity range of motion, it was about 115 of motion and coming out relatively straight.  At this point, the patient was injected with 10 mL Marcaine with 1:100,000 epinephrine in the knee to allow for postoperative pain control.  The patient, at this point, will be discharged home.     Alta Corning, M.D.     Corliss Skains  D:  06/07/2015  T:  06/07/2015  Job:  [id number] 

## 2015-06-07 NOTE — Progress Notes (Signed)
Assisted Dr. Hodierne with right, ultrasound guided, femoral block. Side rails up, monitors on throughout procedure. See vital signs in flow sheet. Tolerated Procedure well. 

## 2015-06-07 NOTE — Anesthesia Procedure Notes (Addendum)
Procedure Name: LMA Insertion Date/Time: 06/07/2015 12:22 PM Performed by: BLOCKER, TIMOTHY D Pre-anesthesia Checklist: Patient identified, Emergency Drugs available, Suction available and Patient being monitored Patient Re-evaluated:Patient Re-evaluated prior to inductionOxygen Delivery Method: Circle System Utilized Preoxygenation: Pre-oxygenation with 100% oxygen Intubation Type: IV induction Ventilation: Mask ventilation without difficulty LMA: LMA inserted LMA Size: 4.0 Number of attempts: 1 Airway Equipment and Method: Bite block Placement Confirmation: positive ETCO2 Tube secured with: Tape Dental Injury: Teeth and Oropharynx as per pre-operative assessment    Anesthesia Regional Block:  Femoral nerve block  Pre-Anesthetic Checklist: ,, timeout performed, Correct Patient, Correct Site, Correct Laterality, Correct Procedure,, site marked, risks and benefits discussed, Surgical consent,  Pre-op evaluation,  At surgeon's request and post-op pain management  Laterality: Right  Prep: chloraprep       Needles:  Injection technique: Single-shot  Needle Type: Echogenic Stimulator Needle     Needle Length: 9cm 9 cm Needle Gauge: 21 and 21 G    Additional Needles:  Procedures: nerve stimulator Femoral nerve block  Nerve Stimulator or Paresthesia:  Response: Quadriceps muscle contraction, 0.45 mA,   Additional Responses:   Narrative:  Start time: 06/07/2015 12:00 PM End time: 06/07/2015 12:11 PM Injection made incrementally with aspirations every 5 mL.  Performed by: Personally  Anesthesiologist: Elfriede Bonini  Additional Notes: Functioning IV was confirmed and monitors were applied.  A 24mm 21ga Arrow echogenic stimulator needle was used. Sterile prep and drape,hand hygiene and sterile gloves were used.  Negative aspiration and negative test dose prior to incremental administration of local anesthetic. The patient tolerated the procedure well.

## 2015-06-07 NOTE — Brief Op Note (Signed)
06/07/2015  12:37 PM  PATIENT:  [name]  59 y.o. female  PRE-OPERATIVE DIAGNOSIS:  ARTHROFIBROSIS RIGHT KNEE   POST-OPERATIVE DIAGNOSIS:  ARTHROFIBROSIS RIGHT KNEE   PROCEDURE:  Procedure(s): CLOSED MANIPULATION RIGHT KNEE (Right)  SURGEON:  Surgeon(s) and Role:    * Dorna Leitz, MD - Primary  PHYSICIAN ASSISTANT:   ASSISTANTS: bethune   ANESTHESIA:   general  EBL:  Total I/O In: 600 [I.V.:600] Out: -   BLOOD ADMINISTERED:none  DRAINS: none   LOCAL MEDICATIONS USED:  MARCAINE     SPECIMEN:  No Specimen  DISPOSITION OF SPECIMEN:  N/A  COUNTS:  YES  TOURNIQUET:  * No tourniquets in log *  DICTATION: .Other Dictation: Dictation Number [id number]  PLAN OF CARE: Admit to inpatient   PATIENT DISPOSITION:  PACU - hemodynamically stable.   Delay start of Pharmacological VTE agent (>24hrs) due to surgical blood loss or risk of bleeding: no

## 2015-06-07 NOTE — Transfer of Care (Signed)
Immediate Anesthesia Transfer of Care Note  Patient: [name]  Procedure(s) Performed: Procedure(s): CLOSED MANIPULATION RIGHT KNEE (Right)  Patient Location: PACU  Anesthesia Type:GA combined with regional for post-op pain  Level of Consciousness: awake and patient cooperative  Airway & Oxygen Therapy: Patient Spontanous Breathing and Patient connected to face mask oxygen  Post-op Assessment: Report given to RN and Post -op Vital signs reviewed and stable  Post vital signs: Reviewed and stable  Last Vitals:  Filed Vitals:   06/07/15 1235 06/07/15 1236  BP:    Pulse: 98 104  Temp:    Resp:  26    Complications: No apparent anesthesia complications

## 2015-06-07 NOTE — Anesthesia Preprocedure Evaluation (Signed)
Anesthesia Evaluation  Patient identified by MRN, date of birth, ID band Patient awake    Reviewed: Allergy & Precautions, NPO status , Patient's Chart, lab work & pertinent test results  Airway Mallampati: II   Neck ROM: full    Dental   Pulmonary former smoker,    breath sounds clear to auscultation       Cardiovascular negative cardio ROS   Rhythm:regular Rate:Normal     Neuro/Psych  Headaches,    GI/Hepatic   Endo/Other    Renal/GU      Musculoskeletal  (+) Arthritis ,   Abdominal   Peds  Hematology   Anesthesia Other Findings   Reproductive/Obstetrics                             Anesthesia Physical Anesthesia Plan  ASA: II  Anesthesia Plan: General   Post-op Pain Management:    Induction: Intravenous  Airway Management Planned: LMA  Additional Equipment:   Intra-op Plan:   Post-operative Plan:   Informed Consent: I have reviewed the patients History and Physical, chart, labs and discussed the procedure including the risks, benefits and alternatives for the proposed anesthesia with the patient or authorized representative who has indicated his/her understanding and acceptance.     Plan Discussed with: CRNA, Anesthesiologist and Surgeon  Anesthesia Plan Comments:         Anesthesia Quick Evaluation

## 2015-06-08 ENCOUNTER — Encounter (HOSPITAL_BASED_OUTPATIENT_CLINIC_OR_DEPARTMENT_OTHER): Payer: Self-pay | Admitting: Orthopedic Surgery

## 2015-06-08 ENCOUNTER — Ambulatory Visit (HOSPITAL_COMMUNITY): Payer: Medicare Other | Attending: Orthopedic Surgery | Admitting: Physical Therapy

## 2015-06-08 DIAGNOSIS — M25561 Pain in right knee: Secondary | ICD-10-CM

## 2015-06-08 DIAGNOSIS — R262 Difficulty in walking, not elsewhere classified: Secondary | ICD-10-CM | POA: Diagnosis present

## 2015-06-08 DIAGNOSIS — Z96652 Presence of left artificial knee joint: Secondary | ICD-10-CM | POA: Diagnosis present

## 2015-06-08 DIAGNOSIS — Z96651 Presence of right artificial knee joint: Secondary | ICD-10-CM | POA: Diagnosis present

## 2015-06-08 DIAGNOSIS — M25661 Stiffness of right knee, not elsewhere classified: Secondary | ICD-10-CM | POA: Diagnosis present

## 2015-06-08 DIAGNOSIS — M25562 Pain in left knee: Secondary | ICD-10-CM | POA: Diagnosis present

## 2015-06-08 DIAGNOSIS — Z9889 Other specified postprocedural states: Secondary | ICD-10-CM | POA: Insufficient documentation

## 2015-06-08 DIAGNOSIS — R29898 Other symptoms and signs involving the musculoskeletal system: Secondary | ICD-10-CM | POA: Diagnosis present

## 2015-06-08 DIAGNOSIS — M25662 Stiffness of left knee, not elsewhere classified: Secondary | ICD-10-CM | POA: Diagnosis present

## 2015-06-08 DIAGNOSIS — M6281 Muscle weakness (generalized): Secondary | ICD-10-CM | POA: Insufficient documentation

## 2015-06-08 NOTE — Therapy (Signed)
[facility] [address] [place], [place], [address] Phone: [phone]   Fax:  [phone]  Physical Therapy Treatment (Re-Assessment)  Patient Details  Name: SHAREENA WILLENBRINK MRN: [id number] Date of Birth: 1955-12-08 Referring Provider: Dr. Berenice Primas  Encounter Date: 06/08/2015      PT End of Session - 06/08/15 1311    Visit Number 17   Number of Visits 27   Date for PT Re-Evaluation 07/06/15   Authorization Type BCBS/medicare    PT Start Time 1147   PT Stop Time 1227   PT Time Calculation (min) 40 min   Activity Tolerance Patient tolerated treatment well   Behavior During Therapy [facility] for tasks assessed/performed      Past Medical History  Diagnosis Date  . Hyperlipidemia     takes Simvastatin nightly  . Seasonal allergies     in spring  . History of migraine     last one was on 06/02/12;takes Topamax bid and Imitrex prn  . Arthritis   . Joint pain   . Joint swelling   . Insomnia     takes Trazodone nightly  . Headache(784.0)   . Cancer [facility]) 1978     cervical cancer    Past Surgical History  Procedure Laterality Date  . Cyst removed from right knee   1972  . Left knee surgery  1973  . Abdominal hysterectomy  1978  . Right knee arthroscopy  90's     x 3  . Right knee partial replacement  1991  . Bladder tacked  1999  . Breast surgery  2009    cyst removed from left breast  . Right wrist sugery  2008  . Scar tissue removed  2008  . Appendectomy  2008  . Right wrist surgery    . Left knee surgery  2012  . Left knee surgery  2012  . Right carpal tunnel    . Colonoscopy    . Artroplasty    . Total knee arthroplasty  06/12/2012    LEFT KNEE  . Total knee arthroplasty  06/12/2012    Procedure: TOTAL KNEE ARTHROPLASTY;  Surgeon: Ninetta Lights, MD;  Location: [place];  Service: Orthopedics;  Laterality: Left;  . Total knee revision Left 03/26/2013    Procedure: TOTAL KNEE REVISION- left;  Surgeon: Alta Corning,  MD;  Location: [place];  Service: Orthopedics;  Laterality: Left;  . Total knee arthroplasty Right 04/05/2015  . Total knee revision Right 04/05/2015    Procedure: TOTAL KNEE REVISION;  Surgeon: Dorna Leitz, MD;  Location: [place];  Service: Orthopedics;  Laterality: Right;  . Knee closed reduction Right 06/07/2015    Procedure: CLOSED MANIPULATION RIGHT KNEE;  Surgeon: Dorna Leitz, MD;  Location: [place];  Service: Orthopedics;  Laterality: Right;    There were no vitals filed for this visit.  Visit Diagnosis:  Stiffness of right knee - Plan: PT plan of care cert/re-cert  Pain in right knee - Plan: PT plan of care cert/re-cert  Weakness of right leg - Plan: PT plan of care cert/re-cert  Difficulty walking down step - Plan: PT plan of care cert/re-cert  Status post total right knee replacement - Plan: PT plan of care cert/re-cert  Difficulty walking - Plan: PT plan of care cert/re-cert  S/P surgical manipulation of knee joint - Plan: PT plan of care cert/re-cert      Subjective Assessment - 06/08/15 1156    Subjective Patient reports  that she went to MD, who did a manipulation yesterday with nerve block, which she is still feeling quite a bit of numbness from the block, still complaining of some IT band discomfort    Pertinent History pt had a cyst removed from her cartilage when she was 15; they removed her cyst with all of her cartilage, she has had multiple surgeries on her knee. Ms. Norgren states that she had a right knee replacement on 04/05/2015; she was discharged on the 4th and referred to [facility].  She is now being referred to out-patient therapy to maximize her functional ability. She stopped her [place] on 04/23/2015.  She states the bend is not what it should be and she is having a lot of pain with it.  She has had pain in her knee since she was 15 and has been wearing a brace on her knee for the past 3 years.             [facility] PT Assessment - 06/08/15 0001     Assessment   Medical Diagnosis R TKR/R manipulation    Onset Date/Surgical Date --  originial surgery was october 3rd; manipulation on 12/5    Next MD Visit December 13th with Dr. Berenice Primas     Prior Therapy [facility]   Precautions   Precautions None   Restrictions   Weight Bearing Restrictions No   Balance Screen   Has the patient fallen in the past 6 months No   Has the patient had a decrease in activity level because of a fear of falling?  No   Is the patient reluctant to leave their home because of a fear of falling?  No   Home Environment   Living Environment Private residence   Type of [place] Access Stairs to enter   Entrance Stairs-Number of Steps 6   Entrance Stairs-Rails Can reach both   Home Layout One level   Prior Function   Level of Independence Independent   Vocation On disability   Leisure quilt    Cognition   Overall Cognitive Status Within Functional Limits for tasks assessed   Single Leg Stance   Comments R 7 seconds today    AROM   Right Knee Extension 12  quad still very numb from block and difficulty activating   Right Knee Flexion 99   Strength   Right Hip Flexion 3-/5   Right Hip Extension 2+/5   Right Hip ABduction 4-/5   Right Knee Flexion 4+/5   Right Knee Extension 2/5  appears to be from effects of nerve block, not weakness    Right Ankle Dorsiflexion 5/5                     [facility] Adult PT Treatment/Exercise - 06/08/15 0001    Knee/Hip Exercises: Stretches   Active Hamstring Stretch 3 reps;30 seconds   Active Hamstring Stretch Limitations 12 inch box    Quad Stretch Right;3 reps;30 seconds   Gastroc Stretch Both;3 reps;30 seconds   Gastroc Stretch Limitations slantboard    Knee/Hip Exercises: Standing   Rocker Board Limitations x20AP, x20 lateral B HHA                 PT Education - 06/08/15 1311    Education provided Yes   Education Details physical effects of knee manipulation and edema, nerve block effect on  quad, plan of care moving forward, general prognosis with manipulation    Person(s) Educated  Patient   Methods Explanation   Comprehension Verbalized understanding          PT Short Term Goals - 05/12/15 1349    PT SHORT TERM GOAL #1   Title I HEP   Time 1   Period Weeks   Status Achieved   PT SHORT TERM GOAL #2   Title ROM to improve to 4-110 to allow pt to be comfortable while sitting for 30 minutes.    Time 2   Period Weeks   Status On-going   PT SHORT TERM GOAL #3   Title quadricep strength to be 3+/5 to allow pt to be able to tolerate standing for 15 minutes to make a small meal.    Baseline 05/12/2015 Pt quadricep strength is there but pt is unable to stand due to pain in her knee.    Time 2   Period Weeks   Status On-going   PT SHORT TERM GOAL #4   Title Pt pain level to be no greater than a 5/10 in the past week    Time 2   Period Weeks   Status On-going   PT SHORT TERM GOAL #5   Title 2   Time 2   Period Weeks   Status Achieved           PT Long Term Goals - 05/12/15 1350    PT LONG TERM GOAL #1   Title Pt to be I in advance HEP   Time 4   Period Weeks   Status Achieved   PT LONG TERM GOAL #2   Title Pt ROM to be 2-115 to  allow pt to have a normalized gait and to be able to sit for an hour at a time with comfot to be able to dine   Baseline ROM6-99   Time 4   Period Weeks   Status On-going   PT LONG TERM GOAL #3   Title Pt strength to be at least 4+/5 to be able to walk for 30 mintues for better health habits.    Time 4   Period Weeks   Status Achieved   PT LONG TERM GOAL #4   Title Pt to be able to go up and down steps using one hand in a reciprical manner   Baseline just starting    Time 4   Period Weeks   Status On-going   PT LONG TERM GOAL #5   Title Pt pain to be no greater than a 3/10 to allow pt to sleep without waking from pain    Time 4   Period Weeks   Status On-going               Plan - 06/08/15 1312    Clinical  Impression Statement Re-assessment performed today as MD did perform manipulation on her yesterday. Patient continues to demonstrate limited knee ROM and weakness, however it is important to note taht ROM was likely limited by edema and quad specific weakness (which is assumed to be secondary to nerve block this session). Patient also continues to demonstrate gait impairment, but again this is likely partially an effect of nthe numbness from nerve block performed yesterday. At this time patient will benefit from skilled PT services to facilitate improved function and assist her in reaching an optimal level of functional performance with minimal fall risk.    Pt will benefit from skilled therapeutic intervention in order to improve on the following deficits Abnormal gait;Decreased balance;Decreased endurance;Decreased range of  motion;Decreased strength;Difficulty walking;Hypomobility;Increased edema;Increased fascial restricitons;Pain   Rehab Potential Good   Clinical Impairments Affecting Rehab Potential 7 Surgeries total on this same knee leading to chronic periods of weakness.    PT Frequency 5x / week   PT Duration 2 weeks   PT Treatment/Interventions ADLs/Self Care Home Management;Gait training;Stair training;Functional mobility training;Therapeutic activities;Therapeutic exercise;Balance training;Patient/family education;Passive range of motion;Manual techniques   PT Next Visit Plan continue to focus on strength and ROM status-post manipulation    PT Home Exercise Plan Folow up and get feedback newly added stretches at next session.    Consulted and Agree with Plan of Care Patient          G-Codes - 06/15/15 1317    Functional Assessment Tool Used Based on skilled clinical assessment of ROM, strength, gait, balance, functional task performance    Functional Limitation Mobility: Walking and moving around   Mobility: Walking and Moving Around Current Status [phone]) At least 60 percent but less  than 80 percent impaired, limited or restricted   Mobility: Walking and Moving Around Goal Status [phone]) At least 40 percent but less than 60 percent impaired, limited or restricted      Problem List Patient Active Problem List   Diagnosis Date Noted  . Arthrofibrosis of knee joint 06/07/2015  . Status post right knee replacement 06/07/2015  . Primary osteoarthritis of right knee 04/05/2015  . Pain due to knee joint prosthesis ([place]) 04/05/2015  . Status post revision of total replacement of left knee 04/15/2013  . Pain due to total left knee replacement ([place]) 03/27/2013  . Muscle weakness (generalized) 07/02/2012  . Total knee replacement status 07/02/2012  . Difficulty in walking(719.7) 07/02/2012  . Stiffness of left knee 07/02/2012  . Pain in left knee 07/02/2012    Physical Therapy Progress Note  Dates of Reporting Period: 05/31/15 to Jun 15, 2015  Objective Reports of Subjective Statement: see above   Objective Measurements: see above   Goal Update: see above   Plan: see above   Reason Skilled Services are Required: reduced knee ROM, functional weakness, gait deviation, unsteadiness status post manipulation   Deniece Ree PT, DPT [facility] [address] [place], [place], [address] Phone: [phone]   Fax:  [phone]  Name: SALVATRICE SUPPLE MRN: [id number] Date of Birth: 27-Jan-1956

## 2015-06-09 ENCOUNTER — Ambulatory Visit (HOSPITAL_COMMUNITY): Payer: Medicare Other | Admitting: Physical Therapy

## 2015-06-09 DIAGNOSIS — Z96651 Presence of right artificial knee joint: Secondary | ICD-10-CM

## 2015-06-09 DIAGNOSIS — R29898 Other symptoms and signs involving the musculoskeletal system: Secondary | ICD-10-CM

## 2015-06-09 DIAGNOSIS — M25661 Stiffness of right knee, not elsewhere classified: Secondary | ICD-10-CM

## 2015-06-09 DIAGNOSIS — Z9889 Other specified postprocedural states: Secondary | ICD-10-CM

## 2015-06-09 DIAGNOSIS — R262 Difficulty in walking, not elsewhere classified: Secondary | ICD-10-CM

## 2015-06-09 DIAGNOSIS — M25561 Pain in right knee: Secondary | ICD-10-CM

## 2015-06-09 NOTE — Therapy (Signed)
[facility] [place], [place], [address] Phone: [phone]   Fax:  [phone]  Physical Therapy Treatment  Patient Details  Name: [name] MRN: [id number] Date of Birth: 01/08/1956 Referring Provider: Dr. Berenice Primas  Encounter Date: 06/09/2015      PT End of Session - 06/09/15 1451    Visit Number 18   Number of Visits 27   Date for PT Re-Evaluation 07/06/15   Authorization Type BCBS/medicare    PT Start Time 1301   PT Stop Time 1345   PT Time Calculation (min) 44 min   Activity Tolerance Patient tolerated treatment well   Behavior During Therapy [facility] for tasks assessed/performed      Past Medical History  Diagnosis Date  . Hyperlipidemia     takes Simvastatin nightly  . Seasonal allergies     in spring  . History of migraine     last one was on 06/02/12;takes Topamax bid and Imitrex prn  . Arthritis   . Joint pain   . Joint swelling   . Insomnia     takes Trazodone nightly  . Headache(784.0)   . Cancer [facility]) 1978     cervical cancer    Past Surgical History  Procedure Laterality Date  . Cyst removed from right knee   1972  . Left knee surgery  1973  . Abdominal hysterectomy  1978  . Right knee arthroscopy  90's     x 3  . Right knee partial replacement  1991  . Bladder tacked  1999  . Breast surgery  2009    cyst removed from left breast  . Right wrist sugery  2008  . Scar tissue removed  2008  . Appendectomy  2008  . Right wrist surgery    . Left knee surgery  2012  . Left knee surgery  2012  . Right carpal tunnel    . Colonoscopy    . Artroplasty    . Total knee arthroplasty  06/12/2012    LEFT KNEE  . Total knee arthroplasty  06/12/2012    Procedure: TOTAL KNEE ARTHROPLASTY;  Surgeon: Ninetta Lights, MD;  Location: [place];  Service: Orthopedics;  Laterality: Left;  . Total knee revision Left 03/26/2013    Procedure: TOTAL KNEE REVISION- left;  Surgeon: Alta Corning, MD;  Location: [place];  Service: Orthopedics;  Laterality: Left;  . Total knee arthroplasty Right 04/05/2015  . Total knee revision Right 04/05/2015    Procedure: TOTAL KNEE REVISION;  Surgeon: Dorna Leitz, MD;  Location: [place];  Service: Orthopedics;  Laterality: Right;  . Knee closed reduction Right 06/07/2015    Procedure: CLOSED MANIPULATION RIGHT KNEE;  Surgeon: Dorna Leitz, MD;  Location: [place];  Service: Orthopedics;  Laterality: Right;    There were no vitals filed for this visit.  Visit Diagnosis:  Stiffness of right knee  Pain in right knee  Weakness of right leg  Difficulty walking down step  Status post total right knee replacement  Difficulty walking  S/P surgical manipulation of knee joint      Subjective Assessment - 06/09/15 1303    Subjective Pateitn reports she is still having quite a bit of numbness from nerve block and is still real swollen   Pertinent History pt had a cyst removed from her cartilage when she was 15; they removed her cyst with all of her cartilage, she has had multiple surgeries on her knee.  Ms. Tejada states that she had a right knee replacement on 04/05/2015; she was discharged on the 4th and referred to [facility].  She is now being referred to out-patient therapy to maximize her functional ability. She stopped her [place] on 04/23/2015.  She states the bend is not what it should be and she is having a lot of pain with it.  She has had pain in her knee since she was 15 and has been wearing a brace on her knee for the past 3 years.    Currently in Pain? Yes   Pain Score 5    Pain Location Knee   Pain Orientation Right                         [facility] Adult PT Treatment/Exercise - 06/09/15 0001    Knee/Hip Exercises: Stretches   Active Hamstring Stretch 3 reps;30 seconds   Active Hamstring Stretch Limitations 12 inch box    Quad Stretch Right;3 reps;30 seconds   Knee: Self-Stretch to increase Flexion Right  10 reps, 10 second holds     Press photographer Both;3 reps;30 seconds   Gastroc Stretch Limitations slantboard    Knee/Hip Exercises: Supine   Quad Sets 20 reps   Heel Slides Right;10 reps   Other Supine Knee/Hip Exercises PROM extension and flexion x10   Manual Therapy   Manual Therapy Edema management   Edema Management retrograde massage to knee for edema management     Soft tissue mobilization soft tissue mobilzation quadriceps muscle                 PT Education - 06/09/15 1450    Education provided Yes   Education Details need for re-assessment last session due to acute procedure (manipulation)   Person(s) Educated Patient   Methods Explanation   Comprehension Verbalized understanding          PT Short Term Goals - 05/12/15 1349    PT SHORT TERM GOAL #1   Title I HEP   Time 1   Period Weeks   Status Achieved   PT SHORT TERM GOAL #2   Title ROM to improve to 4-110 to allow pt to be comfortable while sitting for 30 minutes.    Time 2   Period Weeks   Status On-going   PT SHORT TERM GOAL #3   Title quadricep strength to be 3+/5 to allow pt to be able to tolerate standing for 15 minutes to make a small meal.    Baseline 05/12/2015 Pt quadricep strength is there but pt is unable to stand due to pain in her knee.    Time 2   Period Weeks   Status On-going   PT SHORT TERM GOAL #4   Title Pt pain level to be no greater than a 5/10 in the past week    Time 2   Period Weeks   Status On-going   PT SHORT TERM GOAL #5   Title 2   Time 2   Period Weeks   Status Achieved           PT Long Term Goals - 05/12/15 1350    PT LONG TERM GOAL #1   Title Pt to be I in advance HEP   Time 4   Period Weeks   Status Achieved   PT LONG TERM GOAL #2   Title Pt ROM to be 2-115 to  allow pt to have a normalized gait and to  be able to sit for an hour at a time with comfot to be able to dine   Baseline ROM6-99   Time 4   Period Weeks   Status On-going   PT LONG TERM GOAL #3   Title Pt strength to  be at least 4+/5 to be able to walk for 30 mintues for better health habits.    Time 4   Period Weeks   Status Achieved   PT LONG TERM GOAL #4   Title Pt to be able to go up and down steps using one hand in a reciprical manner   Baseline just starting    Time 4   Period Weeks   Status On-going   PT LONG TERM GOAL #5   Title Pt pain to be no greater than a 3/10 to allow pt to sleep without waking from pain    Time 4   Period Weeks   Status On-going               Plan - 06/09/15 1452    Clinical Impression Statement Continued with functional exercises and stretches today with focus on ROM and reducing pain/edema. Patient continues to experience numbness in her quad due to nerve block that was given for manipulation however it is improving and she can now activate her quad a little better. However ROM does continue to be limited. Finished session with retrograde massage and soft tissue mobilization of quadriceps, both of which were performed separately from other activities/exercises this session. Pain reduced to 2-3/10 at end of session.    Pt will benefit from skilled therapeutic intervention in order to improve on the following deficits Abnormal gait;Decreased balance;Decreased endurance;Decreased range of motion;Decreased strength;Difficulty walking;Hypomobility;Increased edema;Increased fascial restricitons;Pain   Rehab Potential Good   Clinical Impairments Affecting Rehab Potential 7 Surgeries total on this same knee leading to chronic periods of weakness.    PT Frequency 5x / week   PT Duration 2 weeks   PT Treatment/Interventions ADLs/Self Care Home Management;Gait training;Stair training;Functional mobility training;Therapeutic activities;Therapeutic exercise;Balance training;Patient/family education;Passive range of motion;Manual techniques   PT Next Visit Plan continue to focus on strength and ROM status-post manipulation    PT Home Exercise Plan Folow up and get feedback  newly added stretches at next session.    Consulted and Agree with Plan of Care Patient          G-Codes - 02-Jul-2015 1317    Functional Assessment Tool Used Based on skilled clinical assessment of ROM, strength, gait, balance, functional task performance    Functional Limitation Mobility: Walking and moving around   Mobility: Walking and Moving Around Current Status [phone]) At least 60 percent but less than 80 percent impaired, limited or restricted   Mobility: Walking and Moving Around Goal Status [phone]) At least 40 percent but less than 60 percent impaired, limited or restricted      Problem List Patient Active Problem List   Diagnosis Date Noted  . Arthrofibrosis of knee joint 06/07/2015  . Status post right knee replacement 06/07/2015  . Primary osteoarthritis of right knee 04/05/2015  . Pain due to knee joint prosthesis ([place]) 04/05/2015  . Status post revision of total replacement of left knee 04/15/2013  . Pain due to total left knee replacement ([place]) 03/27/2013  . Muscle weakness (generalized) 07/02/2012  . Total knee replacement status 07/02/2012  . Difficulty in walking(719.7) 07/02/2012  . Stiffness of left knee 07/02/2012  . Pain in left knee 07/02/2012    Cyril Mourning  Hanley Hays, DPT [phone]  [facility] [address] [place], [place], [address] Phone: [phone]   Fax:  [phone]  Name: [name] MRN: [id number] Date of Birth: 09-15-1955

## 2015-06-10 ENCOUNTER — Ambulatory Visit (HOSPITAL_COMMUNITY): Payer: Medicare Other | Admitting: Physical Therapy

## 2015-06-10 DIAGNOSIS — M25561 Pain in right knee: Secondary | ICD-10-CM

## 2015-06-10 DIAGNOSIS — R29898 Other symptoms and signs involving the musculoskeletal system: Secondary | ICD-10-CM

## 2015-06-10 DIAGNOSIS — M25661 Stiffness of right knee, not elsewhere classified: Secondary | ICD-10-CM

## 2015-06-10 DIAGNOSIS — R262 Difficulty in walking, not elsewhere classified: Secondary | ICD-10-CM

## 2015-06-10 NOTE — Therapy (Signed)
[place], [place], [address] Phone: [phone]   Fax:  [phone]  Physical Therapy Treatment  Patient Details  Name: [name] MRN: [id number] Date of Birth: July 24, 1955 Referring Provider: Dr. Berenice Primas  Encounter Date: 06/10/2015      PT End of Session - 06/10/15 1446    Visit Number 19   Number of Visits 27   Date for PT Re-Evaluation 07/06/15   Authorization Type BCBS/medicare    PT Start Time 1301   PT Stop Time 1346   PT Time Calculation (min) 45 min   Activity Tolerance Patient tolerated treatment well   Behavior During Therapy [facility] for tasks assessed/performed      Past Medical History  Diagnosis Date  . Hyperlipidemia     takes Simvastatin nightly  . Seasonal allergies     in spring  . History of migraine     last one was on 06/02/12;takes Topamax bid and Imitrex prn  . Arthritis   . Joint pain   . Joint swelling   . Insomnia     takes Trazodone nightly  . Headache(784.0)   . Cancer [facility]) 1978     cervical cancer    Past Surgical History  Procedure Laterality Date  . Cyst removed from right knee   1972  . Left knee surgery  1973  . Abdominal hysterectomy  1978  . Right knee arthroscopy  90's     x 3  . Right knee partial replacement  1991  . Bladder tacked  1999  . Breast surgery  2009    cyst removed from left breast  . Right wrist sugery  2008  . Scar tissue removed  2008  . Appendectomy  2008  . Right wrist surgery    . Left knee surgery  2012  . Left knee surgery  2012  . Right carpal tunnel    . Colonoscopy    . Artroplasty    . Total knee arthroplasty  06/12/2012    LEFT KNEE  . Total knee arthroplasty  06/12/2012    Procedure: TOTAL KNEE ARTHROPLASTY;  Surgeon: Ninetta Lights, MD;  Location: [place];  Service: Orthopedics;  Laterality: Left;  . Total knee revision Left 03/26/2013    Procedure: TOTAL KNEE REVISION- left;  Surgeon: Alta Corning, MD;  Location: [place];  Service: Orthopedics;  Laterality: Left;  . Total knee arthroplasty Right 04/05/2015  . Total knee revision Right 04/05/2015    Procedure: TOTAL KNEE REVISION;  Surgeon: Dorna Leitz, MD;  Location: [place];  Service: Orthopedics;  Laterality: Right;  . Knee closed reduction Right 06/07/2015    Procedure: CLOSED MANIPULATION RIGHT KNEE;  Surgeon: Dorna Leitz, MD;  Location: [place];  Service: Orthopedics;  Laterality: Right;    There were no vitals filed for this visit.  Visit Diagnosis:  Stiffness of right knee  Pain in right knee  Weakness of right leg  Difficulty walking      Subjective Assessment - 06/10/15 1305    Subjective Pt reports that she is having a lot of pain in her medial R knee, and she is still having a lot of swelling.    Currently in Pain? Yes   Pain Score 7    Pain Location Knee   Pain Orientation Medial;Posterior;Right  [facility] Adult PT Treatment/Exercise - 06/10/15 0001    Knee/Hip Exercises: Stretches   Active Hamstring Stretch 3 reps;30 seconds   Active Hamstring Stretch Limitations 12 inch box    Quad Stretch Right;3 reps;30 seconds   Knee: Self-Stretch to increase Flexion Right  10 reps, 10 second holds    Press photographer Both;3 reps;30 seconds   Gastroc Stretch Limitations slantboard    Knee/Hip Exercises: Supine   Quad Sets 20 reps   Quad Sets Limitations 3 second hold   Heel Slides Right;10 reps   Knee Flexion Limitations 102   Other Supine Knee/Hip Exercises PROM knee flexion to 106   Manual Therapy   Manual Therapy Edema management;Soft tissue mobilization   Manual therapy comments performed following therex   Edema Management retrograde massage to knee for edema management     Soft tissue mobilization soft tissue mobilzation quadriceps muscle                 PT Education - 06/09/15 1450    Education provided Yes   Education Details need for re-assessment last session  due to acute procedure (manipulation)   Person(s) Educated Patient   Methods Explanation   Comprehension Verbalized understanding          PT Short Term Goals - 05/12/15 1349    PT SHORT TERM GOAL #1   Title I HEP   Time 1   Period Weeks   Status Achieved   PT SHORT TERM GOAL #2   Title ROM to improve to 4-110 to allow pt to be comfortable while sitting for 30 minutes.    Time 2   Period Weeks   Status On-going   PT SHORT TERM GOAL #3   Title quadricep strength to be 3+/5 to allow pt to be able to tolerate standing for 15 minutes to make a small meal.    Baseline 05/12/2015 Pt quadricep strength is there but pt is unable to stand due to pain in her knee.    Time 2   Period Weeks   Status On-going   PT SHORT TERM GOAL #4   Title Pt pain level to be no greater than a 5/10 in the past week    Time 2   Period Weeks   Status On-going   PT SHORT TERM GOAL #5   Title 2   Time 2   Period Weeks   Status Achieved           PT Long Term Goals - 05/12/15 1350    PT LONG TERM GOAL #1   Title Pt to be I in advance HEP   Time 4   Period Weeks   Status Achieved   PT LONG TERM GOAL #2   Title Pt ROM to be 2-115 to  allow pt to have a normalized gait and to be able to sit for an hour at a time with comfot to be able to dine   Baseline ROM6-99   Time 4   Period Weeks   Status On-going   PT LONG TERM GOAL #3   Title Pt strength to be at least 4+/5 to be able to walk for 30 mintues for better health habits.    Time 4   Period Weeks   Status Achieved   PT LONG TERM GOAL #4   Title Pt to be able to go up and down steps using one hand in a reciprical manner   Baseline just starting    Time 4  Period Weeks   Status On-going   PT LONG TERM GOAL #5   Title Pt pain to be no greater than a 3/10 to allow pt to sleep without waking from pain    Time 4   Period Weeks   Status On-going               Plan - 06/10/15 1446    Clinical Impression Statement Treatment  session focused on improving ROM of R knee following manipulation. Pt education was provided throughout treatment regarding her symptoms; pt reported that she didn't understand why she still had swelling and pain after the manipulation. It was explained to pt that her knee had been through a great deal of trauma, and that the swelling and pain would most likely persist for weeks to months. PROM was performed to improve R knee flexion folllowing heel slides and static knee flexion stretch held at 102 degrees, with PROM 106 degrees of flexion was achieved. Treatment ended with retrograde massage to decrease edema in knee, pt educated on continuing with self scar tissue massage to reduce adhesions. Pt reported decreased pain post treatment.    PT Next Visit Plan Continue with focus on ROM and quad strengthening        Problem List Patient Active Problem List   Diagnosis Date Noted  . Arthrofibrosis of knee joint 06/07/2015  . Status post right knee replacement 06/07/2015  . Primary osteoarthritis of right knee 04/05/2015  . Pain due to knee joint prosthesis ([place]) 04/05/2015  . Status post revision of total replacement of left knee 04/15/2013  . Pain due to total left knee replacement ([place]) 03/27/2013  . Muscle weakness (generalized) 07/02/2012  . Total knee replacement status 07/02/2012  . Difficulty in walking(719.7) 07/02/2012  . Stiffness of left knee 07/02/2012  . Pain in left knee 07/02/2012    Hilma Favors, PT, DPT [phone] 06/10/2015, 2:50 PM  [place], [place], [address] Phone: [phone]   Fax:  [phone]  Name: [name] MRN: [id number] Date of Birth: [birth year]

## 2015-06-11 ENCOUNTER — Ambulatory Visit (HOSPITAL_COMMUNITY): Payer: Medicare Other

## 2015-06-11 DIAGNOSIS — M6281 Muscle weakness (generalized): Secondary | ICD-10-CM

## 2015-06-11 DIAGNOSIS — M25561 Pain in right knee: Secondary | ICD-10-CM

## 2015-06-11 DIAGNOSIS — M25661 Stiffness of right knee, not elsewhere classified: Secondary | ICD-10-CM | POA: Diagnosis not present

## 2015-06-11 DIAGNOSIS — Z96652 Presence of left artificial knee joint: Secondary | ICD-10-CM

## 2015-06-11 DIAGNOSIS — Z96651 Presence of right artificial knee joint: Secondary | ICD-10-CM

## 2015-06-11 DIAGNOSIS — Z9889 Other specified postprocedural states: Secondary | ICD-10-CM

## 2015-06-11 DIAGNOSIS — M25662 Stiffness of left knee, not elsewhere classified: Secondary | ICD-10-CM

## 2015-06-11 DIAGNOSIS — R29898 Other symptoms and signs involving the musculoskeletal system: Secondary | ICD-10-CM

## 2015-06-11 DIAGNOSIS — R262 Difficulty in walking, not elsewhere classified: Secondary | ICD-10-CM

## 2015-06-11 DIAGNOSIS — M25562 Pain in left knee: Secondary | ICD-10-CM

## 2015-06-11 NOTE — Therapy (Signed)
[place], [place], [address] Phone: [phone]   Fax:  [phone]  Physical Therapy Treatment  Patient Details  Name: [name] MRN: [id number] Date of Birth: 1956/07/02 Referring Provider: Dr. Berenice Primas  Encounter Date: 06/11/2015      PT End of Session - 06/11/15 1647    Visit Number 20   Number of Visits 27   Date for PT Re-Evaluation 07/06/15   Authorization Type BCBS/medicare    PT Start Time 1359   PT Stop Time 1430   PT Time Calculation (min) 31 min   Activity Tolerance Patient tolerated treatment well;No increased pain  walking better after session.       Past Medical History  Diagnosis Date  . Hyperlipidemia     takes Simvastatin nightly  . Seasonal allergies     in spring  . History of migraine     last one was on 06/02/12;takes Topamax bid and Imitrex prn  . Arthritis   . Joint pain   . Joint swelling   . Insomnia     takes Trazodone nightly  . Headache(784.0)   . Cancer [facility]) 1978     cervical cancer    Past Surgical History  Procedure Laterality Date  . Cyst removed from right knee   1972  . Left knee surgery  1973  . Abdominal hysterectomy  1978  . Right knee arthroscopy  90's     x 3  . Right knee partial replacement  1991  . Bladder tacked  1999  . Breast surgery  2009    cyst removed from left breast  . Right wrist sugery  2008  . Scar tissue removed  2008  . Appendectomy  2008  . Right wrist surgery    . Left knee surgery  2012  . Left knee surgery  2012  . Right carpal tunnel    . Colonoscopy    . Artroplasty    . Total knee arthroplasty  06/12/2012    LEFT KNEE  . Total knee arthroplasty  06/12/2012    Procedure: TOTAL KNEE ARTHROPLASTY;  Surgeon: Ninetta Lights, MD;  Location: [place];  Service: Orthopedics;  Laterality: Left;  . Total knee revision Left 03/26/2013    Procedure: TOTAL KNEE REVISION- left;  Surgeon: Alta Corning, MD;  Location: [place];   Service: Orthopedics;  Laterality: Left;  . Total knee arthroplasty Right 04/05/2015  . Total knee revision Right 04/05/2015    Procedure: TOTAL KNEE REVISION;  Surgeon: Dorna Leitz, MD;  Location: [place];  Service: Orthopedics;  Laterality: Right;  . Knee closed reduction Right 06/07/2015    Procedure: CLOSED MANIPULATION RIGHT KNEE;  Surgeon: Dorna Leitz, MD;  Location: [place];  Service: Orthopedics;  Laterality: Right;    There were no vitals filed for this visit.  Visit Diagnosis:  Stiffness of right knee  S/P surgical manipulation of knee joint  Muscle weakness (generalized)  Pain in right knee  Pain in left knee  Weakness of right leg  Status post revision of total replacement of left knee  Difficulty walking  Difficulty walking down step  Status post total right knee replacement  Stiffness of left knee      Subjective Assessment - 06/11/15 1401    Subjective Pt is descrbing worseing bruising and swelling from manipulation. Pt reporting sharp pain throught anteromedial knee. Pt is using ice and CPM 6 hours each day. Pt has been  unable to don the Jass brace due to swelling.    Pertinent History pt had a cyst removed from her cartilage when she was 15; they removed her cyst with all of her cartilage, she has had multiple surgeries on her knee. [name] states that she had a right knee replacement on 04/05/2015; she was discharged on the 4th and referred to [facility].  She is now being referred to out-patient therapy to maximize her functional ability. She stopped her [place] on 04/23/2015.  She states the bend is not what it should be and she is having a lot of pain with it.  She has had pain in her knee since she was 15 and has been wearing a brace on her knee for the past 3 years.    Currently in Pain? Yes   Pain Score 6    Pain Orientation Right                         [facility] Adult PT Treatment/Exercise - 06/11/15 0001    Knee/Hip Exercises:  Stretches   Quad Stretch Right;20 seconds  Prone; 10x20sec, p prone knee flexion AROM Mobilization belt around bilat thighs to prevent hip abduction during stretch.    Soleus Stretch Limitations 3 reps;30 seconds  manually in prone   Knee/Hip Exercises: Supine   Patellar Mobs med lat 60sec   Manual Therapy   Joint Mobilization Posterior glides tibia on femur Grade IV, 4x45sec  knee at 60 degrees, 80 degrees     -Supine LAQ, hip at 60* 1x15 -Supine knee flexion hip at 60* 1x15 manually resisted -Prone knee flexion AROM 25-60* x25  -Ankle dorsiflexion 1x15 -supine hamstrings strings stretch 5x30sec -supine knee flexion stretch 5x30            PT Education - 06/11/15 1645    Education provided Yes   Education Details discussed role of new swelling on limitations in range.    Person(s) Educated Patient   Methods Explanation   Comprehension Verbalized understanding          PT Short Term Goals - 05/12/15 1349    PT SHORT TERM GOAL #1   Title I HEP   Time 1   Period Weeks   Status Achieved   PT SHORT TERM GOAL #2   Title ROM to improve to 4-110 to allow pt to be comfortable while sitting for 30 minutes.    Time 2   Period Weeks   Status On-going   PT SHORT TERM GOAL #3   Title quadricep strength to be 3+/5 to allow pt to be able to tolerate standing for 15 minutes to make a small meal.    Baseline 05/12/2015 Pt quadricep strength is there but pt is unable to stand due to pain in her knee.    Time 2   Period Weeks   Status On-going   PT SHORT TERM GOAL #4   Title Pt pain level to be no greater than a 5/10 in the past week    Time 2   Period Weeks   Status On-going   PT SHORT TERM GOAL #5   Title 2   Time 2   Period Weeks   Status Achieved           PT Long Term Goals - 05/12/15 1350    PT LONG TERM GOAL #1   Title Pt to be I in advance HEP   Time 4   Period Weeks  Status Achieved   PT LONG TERM GOAL #2   Title Pt ROM to be 2-115 to  allow pt to  have a normalized gait and to be able to sit for an hour at a time with comfot to be able to dine   Baseline ROM6-99   Time 4   Period Weeks   Status On-going   PT LONG TERM GOAL #3   Title Pt strength to be at least 4+/5 to be able to walk for 30 mintues for better health habits.    Time 4   Period Weeks   Status Achieved   PT LONG TERM GOAL #4   Title Pt to be able to go up and down steps using one hand in a reciprical manner   Baseline just starting    Time 4   Period Weeks   Status On-going   PT LONG TERM GOAL #5   Title Pt pain to be no greater than a 3/10 to allow pt to sleep without waking from pain    Time 4   Period Weeks   Status On-going               Plan - 06/11/15 1653    Clinical Impression Statement Pt tolerating session well, stretching is mostly pain free, just gaurded by residual pain after manipulation. Pt is still quite limited in active quads and hamstrings activation, but joint mobility seems less restricted by capsular tightness, although it remains quite limited from effusuion. Pt remains complaint with HEP, icing, and CPM. Will attempt JASS brace once more after swelling improves.      Pt will benefit from skilled therapeutic intervention in order to improve on the following deficits Abnormal gait;Decreased balance;Decreased endurance;Decreased range of motion;Decreased strength;Difficulty walking;Hypomobility;Increased edema;Increased fascial restricitons;Pain   Clinical Impairments Affecting Rehab Potential 7 Surgeries total on this same knee leading to chronic periods of weakness.    PT Frequency 5x / week   PT Duration 2 weeks   PT Treatment/Interventions ADLs/Self Care Home Management;Gait training;Stair training;Functional mobility training;Therapeutic activities;Therapeutic exercise;Balance training;Patient/family education;Passive range of motion;Manual techniques   PT Next Visit Plan Continue with focus on ROM and quad strengthening   PT Home  Exercise Plan Follow up and get feedback newly added stretches at next session (did not get a chance this session)   Consulted and Agree with Plan of Care Patient        Problem List Patient Active Problem List   Diagnosis Date Noted  . Arthrofibrosis of knee joint 06/07/2015  . Status post right knee replacement 06/07/2015  . Primary osteoarthritis of right knee 04/05/2015  . Pain due to knee joint prosthesis ([place]) 04/05/2015  . Status post revision of total replacement of left knee 04/15/2013  . Pain due to total left knee replacement ([place]) 03/27/2013  . Muscle weakness (generalized) 07/02/2012  . Total knee replacement status 07/02/2012  . Difficulty in walking(719.7) 07/02/2012  . Stiffness of left knee 07/02/2012  . Pain in left knee 07/02/2012    Buccola,Allan C 06/11/2015, 4:58 PM  4:59 PM  Etta Grandchild, PT, DPT [place] License # [id number]       [place] [facility] [address] [place], [place], [address] Phone: [phone]   Fax:  [phone]  Name: ANNYE DURST MRN: [id number] Date of Birth: [birth year]

## 2015-06-14 ENCOUNTER — Ambulatory Visit (HOSPITAL_COMMUNITY): Payer: Medicare Other | Admitting: Physical Therapy

## 2015-06-14 DIAGNOSIS — M25561 Pain in right knee: Secondary | ICD-10-CM

## 2015-06-14 DIAGNOSIS — M25661 Stiffness of right knee, not elsewhere classified: Secondary | ICD-10-CM | POA: Diagnosis not present

## 2015-06-14 DIAGNOSIS — R29898 Other symptoms and signs involving the musculoskeletal system: Secondary | ICD-10-CM

## 2015-06-14 NOTE — Therapy (Signed)
[place], [place], [address] Phone: [phone]   Fax:  [phone]  Physical Therapy Treatment  Patient Details  Name: [name] MRN: [id number] Date of Birth: 10-14-1955 Referring Provider: Dr. Berenice Primas  Encounter Date: 06/14/2015      PT End of Session - 06/14/15 1625    Visit Number 21   Number of Visits 27   Date for PT Re-Evaluation 07/06/15   Authorization Type BCBS/medicare    PT Start Time 1430   PT Stop Time 1514   PT Time Calculation (min) 44 min   Activity Tolerance Patient tolerated treatment well   Behavior During Therapy [facility] for tasks assessed/performed      Past Medical History  Diagnosis Date  . Hyperlipidemia     takes Simvastatin nightly  . Seasonal allergies     in spring  . History of migraine     last one was on 06/02/12;takes Topamax bid and Imitrex prn  . Arthritis   . Joint pain   . Joint swelling   . Insomnia     takes Trazodone nightly  . Headache(784.0)   . Cancer [facility]) 1978     cervical cancer    Past Surgical History  Procedure Laterality Date  . Cyst removed from right knee   1972  . Left knee surgery  1973  . Abdominal hysterectomy  1978  . Right knee arthroscopy  90's     x 3  . Right knee partial replacement  1991  . Bladder tacked  1999  . Breast surgery  2009    cyst removed from left breast  . Right wrist sugery  2008  . Scar tissue removed  2008  . Appendectomy  2008  . Right wrist surgery    . Left knee surgery  2012  . Left knee surgery  2012  . Right carpal tunnel    . Colonoscopy    . Artroplasty    . Total knee arthroplasty  06/12/2012    LEFT KNEE  . Total knee arthroplasty  06/12/2012    Procedure: TOTAL KNEE ARTHROPLASTY;  Surgeon: Ninetta Lights, MD;  Location: [place];  Service: Orthopedics;  Laterality: Left;  . Total knee revision Left 03/26/2013    Procedure: TOTAL KNEE REVISION- left;  Surgeon: Alta Corning, MD;  Location: [place];  Service: Orthopedics;  Laterality: Left;  . Total knee arthroplasty Right 04/05/2015  . Total knee revision Right 04/05/2015    Procedure: TOTAL KNEE REVISION;  Surgeon: Dorna Leitz, MD;  Location: [place];  Service: Orthopedics;  Laterality: Right;  . Knee closed reduction Right 06/07/2015    Procedure: CLOSED MANIPULATION RIGHT KNEE;  Surgeon: Dorna Leitz, MD;  Location: [place];  Service: Orthopedics;  Laterality: Right;    There were no vitals filed for this visit.  Visit Diagnosis:  Stiffness of right knee  Pain in right knee  Weakness of right leg      Subjective Assessment - 06/14/15 1624    Subjective Pt reports that she is having a lot of bruising on her leg, it is difficult for her to sit in a chair at this time due to pain and tenderness. Pt reports that she is still unable to use the JAS brace due to increased swelling.    Currently in Pain? Yes   Pain Score 6    Pain Location Knee   Pain Orientation Right  [place] Adult PT Treatment/Exercise - 06/14/15 0001    Knee/Hip Exercises: Stretches   Active Hamstring Stretch 3 reps;30 seconds   Active Hamstring Stretch Limitations 8" ste   Quad Stretch Right;3 reps;30 seconds   Knee: Self-Stretch to increase Flexion Right  10 reps, 10 second holds    Gastroc Stretch Both;3 reps;30 seconds   Gastroc Stretch Limitations slantboard    Knee/Hip Exercises: Supine   Quad Sets 20 reps   Quad Sets Limitations 3 second holds   Heel Slides Right;10 reps   Manual Therapy   Manual Therapy Edema management;Soft tissue mobilization;Passive ROM   Manual therapy comments performed prior to therex   Edema Management retrograde massage to knee for edema management     Passive ROM PROM for flexion performed in supine with hip at 90 degrees                  PT Short Term Goals - 05/12/15 1349    PT SHORT TERM GOAL #1   Title I HEP   Time 1   Period Weeks   Status Achieved   PT  SHORT TERM GOAL #2   Title ROM to improve to 4-110 to allow pt to be comfortable while sitting for 30 minutes.    Time 2   Period Weeks   Status On-going   PT SHORT TERM GOAL #3   Title quadricep strength to be 3+/5 to allow pt to be able to tolerate standing for 15 minutes to make a small meal.    Baseline 05/12/2015 Pt quadricep strength is there but pt is unable to stand due to pain in her knee.    Time 2   Period Weeks   Status On-going   PT SHORT TERM GOAL #4   Title Pt pain level to be no greater than a 5/10 in the past week    Time 2   Period Weeks   Status On-going   PT SHORT TERM GOAL #5   Title 2   Time 2   Period Weeks   Status Achieved           PT Long Term Goals - 05/12/15 1350    PT LONG TERM GOAL #1   Title Pt to be I in advance HEP   Time 4   Period Weeks   Status Achieved   PT LONG TERM GOAL #2   Title Pt ROM to be 2-115 to  allow pt to have a normalized gait and to be able to sit for an hour at a time with comfot to be able to dine   Baseline ROM6-99   Time 4   Period Weeks   Status On-going   PT LONG TERM GOAL #3   Title Pt strength to be at least 4+/5 to be able to walk for 30 mintues for better health habits.    Time 4   Period Weeks   Status Achieved   PT LONG TERM GOAL #4   Title Pt to be able to go up and down steps using one hand in a reciprical manner   Baseline just starting    Time 4   Period Weeks   Status On-going   PT LONG TERM GOAL #5   Title Pt pain to be no greater than a 3/10 to allow pt to sleep without waking from pain    Time 4   Period Weeks   Status On-going  Plan - 06/14/15 1625    Clinical Impression Statement Pt presented to PT with extensive bruising over medial and posterior thigh and hamstring musculature. AROM and MMT completed for hamstring to rule out a tear, pt was able to complete AROM pain free to 60 degrees of flexion before being limited by stiffness in knee and demonstrated good  strength with MMT. Treatment session focused on improving ROM to decrease adhesions, manual therapy to decrease swelling and improve motion, and quad strengthening. Pt denied any increased pain post treatment, encouraged to speak to her MD regarding extensive bruising.    PT Next Visit Plan Continue with focus on ROM, add joint mobs if tolerated        Problem List Patient Active Problem List   Diagnosis Date Noted  . Arthrofibrosis of knee joint 06/07/2015  . Status post right knee replacement 06/07/2015  . Primary osteoarthritis of right knee 04/05/2015  . Pain due to knee joint prosthesis ([place]) 04/05/2015  . Status post revision of total replacement of left knee 04/15/2013  . Pain due to total left knee replacement ([place]) 03/27/2013  . Muscle weakness (generalized) 07/02/2012  . Total knee replacement status 07/02/2012  . Difficulty in walking(719.7) 07/02/2012  . Stiffness of left knee 07/02/2012  . Pain in left knee 07/02/2012    Hilma Favors, PT, DPT [phone] 06/14/2015, 4:30 PM  [place] [address] [place], [place], [address] Phone: [phone]   Fax:  [phone]  Name: [name] MRN: [id number] Date of Birth: 01/21/1956

## 2015-06-15 ENCOUNTER — Ambulatory Visit (HOSPITAL_COMMUNITY): Payer: Medicare Other | Admitting: Physical Therapy

## 2015-06-15 DIAGNOSIS — M25661 Stiffness of right knee, not elsewhere classified: Secondary | ICD-10-CM | POA: Diagnosis not present

## 2015-06-15 DIAGNOSIS — R29898 Other symptoms and signs involving the musculoskeletal system: Secondary | ICD-10-CM

## 2015-06-15 DIAGNOSIS — Z9889 Other specified postprocedural states: Secondary | ICD-10-CM

## 2015-06-15 DIAGNOSIS — M25561 Pain in right knee: Secondary | ICD-10-CM

## 2015-06-15 DIAGNOSIS — M6281 Muscle weakness (generalized): Secondary | ICD-10-CM

## 2015-06-15 NOTE — Therapy (Signed)
[name], [place], [address] Phone: [phone]   Fax:  [phone]  Physical Therapy Treatment  Patient Details  Name: [name] MRN: [id number] Date of Birth: [birth year] Referring Provider: Dr. Berenice Primas  Encounter Date: 06/15/2015      PT End of Session - 06/15/15 1200    Visit Number 22   Number of Visits 27   Date for PT Re-Evaluation 07/06/15   Authorization Type BCBS/medicare (gcodes updated visit #17)   Authorization - Visit Number 79   Authorization - Number of Visits 27   PT Start Time 1110   PT Stop Time 1150   PT Time Calculation (min) 40 min   Activity Tolerance Patient tolerated treatment well   Behavior During Therapy [facility] for tasks assessed/performed      Past Medical History  Diagnosis Date  . Hyperlipidemia     takes Simvastatin nightly  . Seasonal allergies     in spring  . History of migraine     last one was on 06/02/12;takes Topamax bid and Imitrex prn  . Arthritis   . Joint pain   . Joint swelling   . Insomnia     takes Trazodone nightly  . Headache(784.0)   . Cancer [facility]) 1978     cervical cancer    Past Surgical History  Procedure Laterality Date  . Cyst removed from right knee   1972  . Left knee surgery  1973  . Abdominal hysterectomy  1978  . Right knee arthroscopy  90's     x 3  . Right knee partial replacement  1991  . Bladder tacked  1999  . Breast surgery  2009    cyst removed from left breast  . Right wrist sugery  2008  . Scar tissue removed  2008  . Appendectomy  2008  . Right wrist surgery    . Left knee surgery  2012  . Left knee surgery  2012  . Right carpal tunnel    . Colonoscopy    . Artroplasty    . Total knee arthroplasty  06/12/2012    LEFT KNEE  . Total knee arthroplasty  06/12/2012    Procedure: TOTAL KNEE ARTHROPLASTY;  Surgeon: Ninetta Lights, MD;  Location: [place];  Service: Orthopedics;  Laterality: Left;  . Total knee revision  Left 03/26/2013    Procedure: TOTAL KNEE REVISION- left;  Surgeon: Alta Corning, MD;  Location: [place];  Service: Orthopedics;  Laterality: Left;  . Total knee arthroplasty Right 04/05/2015  . Total knee revision Right 04/05/2015    Procedure: TOTAL KNEE REVISION;  Surgeon: Dorna Leitz, MD;  Location: [place];  Service: Orthopedics;  Laterality: Right;  . Knee closed reduction Right 06/07/2015    Procedure: CLOSED MANIPULATION RIGHT KNEE;  Surgeon: Dorna Leitz, MD;  Location: [place];  Service: Orthopedics;  Laterality: Right;    There were no vitals filed for this visit.  Visit Diagnosis:  Stiffness of right knee  Pain in right knee  Weakness of right leg  S/P surgical manipulation of knee joint  Muscle weakness (generalized)      Subjective Assessment - 06/15/15 1113    Subjective Pt states she is going to see Dr. Berenice Primas this afternoon.  STates the back of her leg is still sore and bruised.  6/10 pain today from hip to knee.    Currently in Pain? Yes   Pain Score 6  Pain Location Knee   Pain Orientation Right   Pain Descriptors / Indicators Aching                         [facility] Adult PT Treatment/Exercise - 06/15/15 0001    Knee/Hip Exercises: Stretches   Active Hamstring Stretch 3 reps;30 seconds   Active Hamstring Stretch Limitations 8" ste   Knee: Self-Stretch to increase Flexion Right   Knee: Self-Stretch Limitations 10 reps, 10 second holds    Gastroc Stretch Both;3 reps;30 seconds   Gastroc Stretch Limitations slantboard    Knee/Hip Exercises: Supine   Quad Sets 20 reps   Quad Sets Limitations 3 seconds   Heel Slides Right;10 reps   Knee Flexion PROM   Manual Therapy   Manual Therapy Edema management;Passive ROM;Myofascial release   Manual therapy comments performed prior to therex   Edema Management retrograde massage to knee for edema management     Myofascial Release to anterior, lateral and superior knee   Passive ROM PROM  for flexion performed in supine with hip at 90 degrees                  PT Short Term Goals - 05/12/15 1349    PT SHORT TERM GOAL #1   Title I HEP   Time 1   Period Weeks   Status Achieved   PT SHORT TERM GOAL #2   Title ROM to improve to 4-110 to allow pt to be comfortable while sitting for 30 minutes.    Time 2   Period Weeks   Status On-going   PT SHORT TERM GOAL #3   Title quadricep strength to be 3+/5 to allow pt to be able to tolerate standing for 15 minutes to make a small meal.    Baseline 05/12/2015 Pt quadricep strength is there but pt is unable to stand due to pain in her knee.    Time 2   Period Weeks   Status On-going   PT SHORT TERM GOAL #4   Title Pt pain level to be no greater than a 5/10 in the past week    Time 2   Period Weeks   Status On-going   PT SHORT TERM GOAL #5   Title 2   Time 2   Period Weeks   Status Achieved           PT Long Term Goals - 05/12/15 1350    PT LONG TERM GOAL #1   Title Pt to be I in advance HEP   Time 4   Period Weeks   Status Achieved   PT LONG TERM GOAL #2   Title Pt ROM to be 2-115 to  allow pt to have a normalized gait and to be able to sit for an hour at a time with comfot to be able to dine   Baseline ROM6-99   Time 4   Period Weeks   Status On-going   PT LONG TERM GOAL #3   Title Pt strength to be at least 4+/5 to be able to walk for 30 mintues for better health habits.    Time 4   Period Weeks   Status Achieved   PT LONG TERM GOAL #4   Title Pt to be able to go up and down steps using one hand in a reciprical manner   Baseline just starting    Time 4   Period Weeks   Status On-going  PT LONG TERM GOAL #5   Title Pt pain to be no greater than a 3/10 to allow pt to sleep without waking from pain    Time 4   Period Weeks   Status On-going               Plan - 06/15/15 1202    Clinical Impression Statement Continued focus on improving AROM/PROM Rt knee.  PT wtih hard end feel with  PROM.  completed myofascial techniques to anterior knee in endrange to loosen scar tissue and adhesions.  Manual  to decrease edema done to posterior knee in prone. Advised patient to resume JAS asap.   PT Next Visit Plan Continue with focus on ROM, add joint mobs if tolerated        Problem List Patient Active Problem List   Diagnosis Date Noted  . Arthrofibrosis of knee joint 06/07/2015  . Status post right knee replacement 06/07/2015  . Primary osteoarthritis of right knee 04/05/2015  . Pain due to knee joint prosthesis ([place]) 04/05/2015  . Status post revision of total replacement of left knee 04/15/2013  . Pain due to total left knee replacement ([place]) 03/27/2013  . Muscle weakness (generalized) 07/02/2012  . Total knee replacement status 07/02/2012  . Difficulty in walking(719.7) 07/02/2012  . Stiffness of left knee 07/02/2012  . Pain in left knee 07/02/2012    Teena Irani, PTA/CLT [id number]  06/15/2015, 12:15 PM  [place] [address] [place], [place], [address] Phone: [phone]   Fax:  [phone]  Name: [name] MRN: [id number] Date of Birth: 13-Feb-1956

## 2015-06-16 ENCOUNTER — Ambulatory Visit (HOSPITAL_COMMUNITY): Payer: Medicare Other

## 2015-06-16 DIAGNOSIS — Z9889 Other specified postprocedural states: Secondary | ICD-10-CM

## 2015-06-16 DIAGNOSIS — R262 Difficulty in walking, not elsewhere classified: Secondary | ICD-10-CM

## 2015-06-16 DIAGNOSIS — M6281 Muscle weakness (generalized): Secondary | ICD-10-CM

## 2015-06-16 DIAGNOSIS — Z96651 Presence of right artificial knee joint: Secondary | ICD-10-CM

## 2015-06-16 DIAGNOSIS — M25561 Pain in right knee: Secondary | ICD-10-CM

## 2015-06-16 DIAGNOSIS — M25661 Stiffness of right knee, not elsewhere classified: Secondary | ICD-10-CM | POA: Diagnosis not present

## 2015-06-16 DIAGNOSIS — R29898 Other symptoms and signs involving the musculoskeletal system: Secondary | ICD-10-CM

## 2015-06-16 NOTE — Therapy (Signed)
[name], [place], [address] Phone: [phone]   Fax:  [phone]  Physical Therapy Treatment  Patient Details  Name: [name] MRN: [id number] Date of Birth: September 02, 1955 Referring Provider: Dr. Berenice Primas  Encounter Date: 06/16/2015      PT End of Session - 06/16/15 1203    Visit Number 23   Number of Visits 27   Date for PT Re-Evaluation 07/06/15   Authorization Type BCBS/medicare (gcodes updated visit #17)   Authorization - Visit Number 23   Authorization - Number of Visits 27   PT Start Time 1107   PT Stop Time 1155   PT Time Calculation (min) 48 min   Activity Tolerance Patient tolerated treatment well   Behavior During Therapy [facility] for tasks assessed/performed      Past Medical History  Diagnosis Date  . Hyperlipidemia     takes Simvastatin nightly  . Seasonal allergies     in spring  . History of migraine     last one was on 06/02/12;takes Topamax bid and Imitrex prn  . Arthritis   . Joint pain   . Joint swelling   . Insomnia     takes Trazodone nightly  . Headache(784.0)   . Cancer [facility]) 1978     cervical cancer    Past Surgical History  Procedure Laterality Date  . Cyst removed from right knee   1972  . Left knee surgery  1973  . Abdominal hysterectomy  1978  . Right knee arthroscopy  90's     x 3  . Right knee partial replacement  1991  . Bladder tacked  1999  . Breast surgery  2009    cyst removed from left breast  . Right wrist sugery  2008  . Scar tissue removed  2008  . Appendectomy  2008  . Right wrist surgery    . Left knee surgery  2012  . Left knee surgery  2012  . Right carpal tunnel    . Colonoscopy    . Artroplasty    . Total knee arthroplasty  06/12/2012    LEFT KNEE  . Total knee arthroplasty  06/12/2012    Procedure: TOTAL KNEE ARTHROPLASTY;  Surgeon: Ninetta Lights, MD;  Location: [place];  Service: Orthopedics;  Laterality: Left;  . Total knee revision  Left 03/26/2013    Procedure: TOTAL KNEE REVISION- left;  Surgeon: Alta Corning, MD;  Location: [place];  Service: Orthopedics;  Laterality: Left;  . Total knee arthroplasty Right 04/05/2015  . Total knee revision Right 04/05/2015    Procedure: TOTAL KNEE REVISION;  Surgeon: Dorna Leitz, MD;  Location: [place];  Service: Orthopedics;  Laterality: Right;  . Knee closed reduction Right 06/07/2015    Procedure: CLOSED MANIPULATION RIGHT KNEE;  Surgeon: Dorna Leitz, MD;  Location: [place];  Service: Orthopedics;  Laterality: Right;    There were no vitals filed for this visit.  Visit Diagnosis:  Stiffness of right knee  Pain in right knee  Weakness of right leg  S/P surgical manipulation of knee joint  Muscle weakness (generalized)  Difficulty walking  Difficulty walking down step  Status post total right knee replacement      Subjective Assessment - 06/16/15 1132    Subjective Pt stated knee pain 6/10, went to MD yesterday   Pertinent History pt had a cyst removed from her cartilage when she was 15; they removed her cyst with  all of her cartilage, she has had multiple surgeries on her knee. [name] states that she had a right knee replacement on 04/05/2015; she was discharged on the 4th and referred to [facility].  She is now being referred to out-patient therapy to maximize her functional ability. She stopped her [place] on 04/23/2015.  She states the bend is not what it should be and she is having a lot of pain with it.  She has had pain in her knee since she was 15 and has been wearing a brace on her knee for the past 3 years.    Currently in Pain? Yes   Pain Score 6    Pain Location Knee   Pain Orientation Right   Pain Descriptors / Indicators Aching            [facility] Adult PT Treatment/Exercise - 06/16/15 0001    Knee/Hip Exercises: Stretches   Active Hamstring Stretch 3 reps;30 seconds   Active Hamstring Stretch Limitations 12in step   Quad Stretch Right;3 reps;30  seconds   Quad Stretch Limitations prone with rope   Knee: Self-Stretch to increase Flexion Right   Knee: Self-Stretch Limitations 10 reps, 10 second holds    Press photographer Both;3 reps;30 seconds   Gastroc Stretch Limitations slantboard    Knee/Hip Exercises: Seated   Heel Slides Right;10 reps   Knee/Hip Exercises: Supine   Quad Sets 20 reps   Quad Sets Limitations 3" holds   Heel Slides Right;10 reps   Patellar Mobs all directions   Knee Flexion PROM   Knee/Hip Exercises: Prone   Contract/Relax to Increase Flexion 10                  PT Short Term Goals - 05/12/15 1349    PT SHORT TERM GOAL #1   Title I HEP   Time 1   Period Weeks   Status Achieved   PT SHORT TERM GOAL #2   Title ROM to improve to 4-110 to allow pt to be comfortable while sitting for 30 minutes.    Time 2   Period Weeks   Status On-going   PT SHORT TERM GOAL #3   Title quadricep strength to be 3+/5 to allow pt to be able to tolerate standing for 15 minutes to make a small meal.    Baseline 05/12/2015 Pt quadricep strength is there but pt is unable to stand due to pain in her knee.    Time 2   Period Weeks   Status On-going   PT SHORT TERM GOAL #4   Title Pt pain level to be no greater than a 5/10 in the past week    Time 2   Period Weeks   Status On-going   PT SHORT TERM GOAL #5   Title 2   Time 2   Period Weeks   Status Achieved           PT Long Term Goals - 05/12/15 1350    PT LONG TERM GOAL #1   Title Pt to be I in advance HEP   Time 4   Period Weeks   Status Achieved   PT LONG TERM GOAL #2   Title Pt ROM to be 2-115 to  allow pt to have a normalized gait and to be able to sit for an hour at a time with comfot to be able to dine   Baseline ROM6-99   Time 4   Period Weeks   Status On-going  PT LONG TERM GOAL #3   Title Pt strength to be at least 4+/5 to be able to walk for 30 mintues for better health habits.    Time 4   Period Weeks   Status Achieved   PT LONG TERM  GOAL #4   Title Pt to be able to go up and down steps using one hand in a reciprical manner   Baseline just starting    Time 4   Period Weeks   Status On-going   PT LONG TERM GOAL #5   Title Pt pain to be no greater than a 3/10 to allow pt to sleep without waking from pain    Time 4   Period Weeks   Status On-going               Plan - 06/16/15 1203    Clinical Impression Statement Session focus on improving ROM with functional stretches, ROM based strengthening exercises and manual techniques.  Pt continues to present with hand end feel with PROM and adhesions with patella mobs all directions.  Manual technqiues complete with PROM for knee flexion per pt. tolerance, retro massage for edema control and soft tissue mobilizaiton technqiues to quadriceps to reduce tightness. Pt reports she has been compliant with CPM and HEP but has not been using JAS brace due to pain from bruising posterior LE.  Pt encouraged to resume JAS asap for knee flexion.  End of session pt reports pain reduced to 4/10.   PT Next Visit Plan Continue with focus on ROM, add joint mobs if tolerated        Problem List Patient Active Problem List   Diagnosis Date Noted  . Arthrofibrosis of knee joint 06/07/2015  . Status post right knee replacement 06/07/2015  . Primary osteoarthritis of right knee 04/05/2015  . Pain due to knee joint prosthesis ([place]) 04/05/2015  . Status post revision of total replacement of left knee 04/15/2013  . Pain due to total left knee replacement ([place]) 03/27/2013  . Muscle weakness (generalized) 07/02/2012  . Total knee replacement status 07/02/2012  . Difficulty in walking(719.7) 07/02/2012  . Stiffness of left knee 07/02/2012  . Pain in left knee 07/02/2012   Ihor Austin, LPTA; [place]  Aldona Lento 06/16/2015, 12:15 PM  [place], [place], [address] Phone: [phone]   Fax:   [phone]  Name: [name] MRN: [id number] Date of Birth: 09/15/1955

## 2015-06-17 ENCOUNTER — Ambulatory Visit (HOSPITAL_COMMUNITY): Payer: Medicare Other | Admitting: Physical Therapy

## 2015-06-17 DIAGNOSIS — Z96652 Presence of left artificial knee joint: Secondary | ICD-10-CM

## 2015-06-17 DIAGNOSIS — Z96651 Presence of right artificial knee joint: Secondary | ICD-10-CM

## 2015-06-17 DIAGNOSIS — M6281 Muscle weakness (generalized): Secondary | ICD-10-CM

## 2015-06-17 DIAGNOSIS — M25661 Stiffness of right knee, not elsewhere classified: Secondary | ICD-10-CM | POA: Diagnosis not present

## 2015-06-17 NOTE — Therapy (Signed)
[place], [place], [address] Phone: [phone]   Fax:  [phone]  Physical Therapy Treatment  Patient Details  Name: [name] MRN: [id number] Date of Birth: 1956-01-05 Referring Provider: Dr. Berenice Primas  Encounter Date: 06/17/2015      PT End of Session - 06/17/15 1233    Visit Number 24   Number of Visits 27   Date for PT Re-Evaluation 07/06/15   Authorization Type BCBS/medicare (gcodes updated visit #17)   Authorization - Visit Number 24   Authorization - Number of Visits 27   PT Start Time 1055   PT Stop Time 1145   PT Time Calculation (min) 50 min   Activity Tolerance Patient limited by pain   Behavior During Therapy [facility] for tasks assessed/performed      Past Medical History  Diagnosis Date  . Hyperlipidemia     takes Simvastatin nightly  . Seasonal allergies     in spring  . History of migraine     last one was on 06/02/12;takes Topamax bid and Imitrex prn  . Arthritis   . Joint pain   . Joint swelling   . Insomnia     takes Trazodone nightly  . Headache(784.0)   . Cancer [facility]) 1978     cervical cancer    Past Surgical History  Procedure Laterality Date  . Cyst removed from right knee   1972  . Left knee surgery  1973  . Abdominal hysterectomy  1978  . Right knee arthroscopy  90's     x 3  . Right knee partial replacement  1991  . Bladder tacked  1999  . Breast surgery  2009    cyst removed from left breast  . Right wrist sugery  2008  . Scar tissue removed  2008  . Appendectomy  2008  . Right wrist surgery    . Left knee surgery  2012  . Left knee surgery  2012  . Right carpal tunnel    . Colonoscopy    . Artroplasty    . Total knee arthroplasty  06/12/2012    LEFT KNEE  . Total knee arthroplasty  06/12/2012    Procedure: TOTAL KNEE ARTHROPLASTY;  Surgeon: Ninetta Lights, MD;  Location: [place];  Service: Orthopedics;  Laterality: Left;  . Total knee revision Left  03/26/2013    Procedure: TOTAL KNEE REVISION- left;  Surgeon: Alta Corning, MD;  Location: [place];  Service: Orthopedics;  Laterality: Left;  . Total knee arthroplasty Right 04/05/2015  . Total knee revision Right 04/05/2015    Procedure: TOTAL KNEE REVISION;  Surgeon: Dorna Leitz, MD;  Location: [place];  Service: Orthopedics;  Laterality: Right;  . Knee closed reduction Right 06/07/2015    Procedure: CLOSED MANIPULATION RIGHT KNEE;  Surgeon: Dorna Leitz, MD;  Location: [place];  Service: Orthopedics;  Laterality: Right;    There were no vitals filed for this visit.  Visit Diagnosis:  Status post revision of total replacement of left knee  Status post total right knee replacement  Muscle weakness (generalized)  Stiffness of right knee      Subjective Assessment - 06/17/15 1100    Subjective Pt state she is concerned because she can not sit.  States it is not the act of sitting it is the pressure on the back of her knee.  Pt states she is still very swollen and due to the swelling she can not  get into her JAS .   Currently in Pain? Yes   Pain Score 7    Pain Location Knee   Pain Orientation Right;Posterior   Pain Descriptors / Indicators Aching;Sore                [facility] Adult PT Treatment/Exercise - 06/17/15 0001    Knee/Hip Exercises: Stretches   Sports administrator Right;3 reps;30 seconds   Knee: Self-Stretch to increase Flexion Right   Knee: Self-Stretch Limitations 10 reps, 10 second holds    Knee/Hip Exercises: Supine   Quad Sets 10 reps   Heel Slides 10 reps   Patellar Mobs all directions   Knee Extension Limitations 2   Knee Flexion PROM   Knee Flexion Limitations 90   Knee/Hip Exercises: Prone   Hamstring Curl 5 reps   Contract/Relax to Increase Flexion 10   Prone Knee Hang 5 minutes   Prone Knee Hang Weights (lbs) with manual.   Manual Therapy   Manual Therapy Edema management;Passive ROM;Myofascial release   Manual therapy comments performed  prior to therex   Edema Management retrograde massage to knee for edema management     Myofascial Release to anterior, lateral and superior knee   Passive ROM PROM for flexion performed in supine with hip at 90 degrees                  PT Short Term Goals - 05/12/15 1349    PT SHORT TERM GOAL #1   Title I HEP   Time 1   Period Weeks   Status Achieved   PT SHORT TERM GOAL #2   Title ROM to improve to 4-110 to allow pt to be comfortable while sitting for 30 minutes.    Time 2   Period Weeks   Status On-going   PT SHORT TERM GOAL #3   Title quadricep strength to be 3+/5 to allow pt to be able to tolerate standing for 15 minutes to make a small meal.    Baseline 05/12/2015 Pt quadricep strength is there but pt is unable to stand due to pain in her knee.    Time 2   Period Weeks   Status On-going   PT SHORT TERM GOAL #4   Title Pt pain level to be no greater than a 5/10 in the past week    Time 2   Period Weeks   Status On-going   PT SHORT TERM GOAL #5   Title 2   Time 2   Period Weeks   Status Achieved           PT Long Term Goals - 05/12/15 1350    PT LONG TERM GOAL #1   Title Pt to be I in advance HEP   Time 4   Period Weeks   Status Achieved   PT LONG TERM GOAL #2   Title Pt ROM to be 2-115 to  allow pt to have a normalized gait and to be able to sit for an hour at a time with comfot to be able to dine   Baseline ROM6-99   Time 4   Period Weeks   Status On-going   PT LONG TERM GOAL #3   Title Pt strength to be at least 4+/5 to be able to walk for 30 mintues for better health habits.    Time 4   Period Weeks   Status Achieved   PT LONG TERM GOAL #4   Title Pt to be able to  go up and down steps using one hand in a reciprical manner   Baseline just starting    Time 4   Period Weeks   Status On-going   PT LONG TERM GOAL #5   Title Pt pain to be no greater than a 3/10 to allow pt to sleep without waking from pain    Time 4   Period Weeks   Status  On-going               Plan - 06/17/15 1234    Clinical Impression Statement Pt continues to have significant swelling.  ROM continues to be significantly decreased.  Pt currently unable to apply JAS due to swelling.  Pt to bring JAS in next session so therapist can try to problem solve how JAS can be utilized.  Pt progress appears to be limited due to pain as well as edema and scar tissue.  Therapist spoke to pt re:  pt may have maximized benefit of skilled therapy.   Pt is going to consider her options but may be ready for discharge next session.  Manual techniques used to attempt to decrease edema for improved mobility.    PT Next Visit Plan Check JAS fitting, possible discharge to HEP pt has CPM as well as JAS at home to use.         Problem List Patient Active Problem List   Diagnosis Date Noted  . Arthrofibrosis of knee joint 06/07/2015  . Status post right knee replacement 06/07/2015  . Primary osteoarthritis of right knee 04/05/2015  . Pain due to knee joint prosthesis ([place]) 04/05/2015  . Status post revision of total replacement of left knee 04/15/2013  . Pain due to total left knee replacement ([place]) 03/27/2013  . Muscle weakness (generalized) 07/02/2012  . Total knee replacement status 07/02/2012  . Difficulty in walking(719.7) 07/02/2012  . Stiffness of left knee 07/02/2012  . Pain in left knee 07/02/2012    Rayetta Humphrey, PT CLT [id number] 06/17/2015, 12:40 PM  [place] [address] [place], [place], [address] Phone: [phone]   Fax:  [phone]  Name: [name] MRN: [id number] Date of Birth: [birth year]

## 2015-06-18 ENCOUNTER — Ambulatory Visit (HOSPITAL_COMMUNITY): Payer: Medicare Other

## 2015-06-18 DIAGNOSIS — M25662 Stiffness of left knee, not elsewhere classified: Secondary | ICD-10-CM

## 2015-06-18 DIAGNOSIS — M25561 Pain in right knee: Secondary | ICD-10-CM

## 2015-06-18 DIAGNOSIS — Z96652 Presence of left artificial knee joint: Secondary | ICD-10-CM

## 2015-06-18 DIAGNOSIS — R262 Difficulty in walking, not elsewhere classified: Secondary | ICD-10-CM

## 2015-06-18 DIAGNOSIS — R29898 Other symptoms and signs involving the musculoskeletal system: Secondary | ICD-10-CM

## 2015-06-18 DIAGNOSIS — M25661 Stiffness of right knee, not elsewhere classified: Secondary | ICD-10-CM

## 2015-06-18 DIAGNOSIS — M25562 Pain in left knee: Secondary | ICD-10-CM

## 2015-06-18 DIAGNOSIS — Z9889 Other specified postprocedural states: Secondary | ICD-10-CM

## 2015-06-18 DIAGNOSIS — M6281 Muscle weakness (generalized): Secondary | ICD-10-CM

## 2015-06-18 DIAGNOSIS — Z96651 Presence of right artificial knee joint: Secondary | ICD-10-CM

## 2015-06-18 NOTE — Therapy (Addendum)
[place], [place], [address] Phone: [phone]   Fax:  [phone]  Physical Therapy Treatment  Patient Details  Name: [name] MRN: [id number] Date of Birth: 1956/02/23 Referring Provider: Dr. Berenice Primas  Encounter Date: 06/18/2015      PT End of Session - 06/18/15 1221    Visit Number 25   Number of Visits 27   Date for PT Re-Evaluation 07/06/15   Authorization Type BCBS/medicare (gcodes updated visit #17)   Authorization - Visit Number 25   Authorization - Number of Visits 27   PT Start Time 1103   PT Stop Time 1151   PT Time Calculation (min) 48 min   Equipment Utilized During Treatment Gait belt   Activity Tolerance Patient limited by pain;Patient tolerated treatment well   Behavior During Therapy [facility] for tasks assessed/performed      Past Medical History  Diagnosis Date  . Hyperlipidemia     takes Simvastatin nightly  . Seasonal allergies     in spring  . History of migraine     last one was on 06/02/12;takes Topamax bid and Imitrex prn  . Arthritis   . Joint pain   . Joint swelling   . Insomnia     takes Trazodone nightly  . Headache(784.0)   . Cancer [facility]) 1978     cervical cancer    Past Surgical History  Procedure Laterality Date  . Cyst removed from right knee   1972  . Left knee surgery  1973  . Abdominal hysterectomy  1978  . Right knee arthroscopy  90's     x 3  . Right knee partial replacement  1991  . Bladder tacked  1999  . Breast surgery  2009    cyst removed from left breast  . Right wrist sugery  2008  . Scar tissue removed  2008  . Appendectomy  2008  . Right wrist surgery    . Left knee surgery  2012  . Left knee surgery  2012  . Right carpal tunnel    . Colonoscopy    . Artroplasty    . Total knee arthroplasty  06/12/2012    LEFT KNEE  . Total knee arthroplasty  06/12/2012    Procedure: TOTAL KNEE ARTHROPLASTY;  Surgeon: Ninetta Lights, MD;  Location:  [place];  Service: Orthopedics;  Laterality: Left;  . Total knee revision Left 03/26/2013    Procedure: TOTAL KNEE REVISION- left;  Surgeon: Alta Corning, MD;  Location: [place];  Service: Orthopedics;  Laterality: Left;  . Total knee arthroplasty Right 04/05/2015  . Total knee revision Right 04/05/2015    Procedure: TOTAL KNEE REVISION;  Surgeon: Dorna Leitz, MD;  Location: [place];  Service: Orthopedics;  Laterality: Right;  . Knee closed reduction Right 06/07/2015    Procedure: CLOSED MANIPULATION RIGHT KNEE;  Surgeon: Dorna Leitz, MD;  Location: [place];  Service: Orthopedics;  Laterality: Right;    There were no vitals filed for this visit.  Visit Diagnosis:  Status post revision of total replacement of left knee  Difficulty walking down step  Status post total right knee replacement  Pain in left knee  Muscle weakness (generalized)  Stiffness of left knee  Stiffness of right knee  Pain in right knee  Weakness of right leg  S/P surgical manipulation of knee joint  Difficulty walking      Subjective Assessment - 06/18/15 1236  Subjective Pt remains somewhat frustrated about difficulty making progress, continued pain, and continued mobility dysfunciton. Her pain remains limiting during stretches at home, but seems to improve wth prolonged use of CPM.    Pertinent History pt had a cyst removed from her cartilage when she was 15; they removed her cyst with all of her cartilage, she has had multiple surgeries on her knee. [name] states that she had a right knee replacement on 04/05/2015; she was discharged on the 4th and referred to [facility].  She is now being referred to out-patient therapy to maximize her functional ability. She stopped her [place] on 04/23/2015.  She states the bend is not what it should be and she is having a lot of pain with it.  She has had pain in her knee since she was 15 and has been wearing a brace on her knee for the past 3 years.    Currently  in Pain? Yes   Pain Score 7    Pain Location Knee   Pain Orientation Right;Posterior;Medial;Anterior                         [facility] Adult PT Treatment/Exercise - 06/18/15 0001    Knee/Hip Exercises: Stretches   Passive Hamstring Stretch Right  supine heel on 6" box, weight on knee 10 minutes   Other Knee/Hip Stretches tibial anterior glide stretches  knee at 60 degrees, 1x3 minutes   Knee/Hip Exercises: Supine   Quad Sets 1 set;10 reps;Right   Quad Sets Limitations 3s hold   Bridges Limitations 15x straight knees, feet elevated  15x1sec hold   Knee Extension Limitations 10   Knee Flexion Limitations 83   Modalities   Modalities Moist Heat   Moist Heat Therapy   Number Minutes Moist Heat 10 Minutes   Moist Heat Location Knee   Manual Therapy   Manual Therapy Myofascial release   Edema Management Attempted patella mobilizations   Joint Mobilization Posterior glides tibia on femur Grade IV, 3x90sec  knee at 65 degrees   Myofascial Release R VMO while in flexion stretch  3 minutes                  PT Short Term Goals - 05/12/15 1349    PT SHORT TERM GOAL #1   Title I HEP   Time 1   Period Weeks   Status Achieved   PT SHORT TERM GOAL #2   Title ROM to improve to 4-110 to allow pt to be comfortable while sitting for 30 minutes.    Time 2   Period Weeks   Status On-going   PT SHORT TERM GOAL #3   Title quadricep strength to be 3+/5 to allow pt to be able to tolerate standing for 15 minutes to make a small meal.    Baseline 05/12/2015 Pt quadricep strength is there but pt is unable to stand due to pain in her knee.    Time 2   Period Weeks   Status On-going   PT SHORT TERM GOAL #4   Title Pt pain level to be no greater than a 5/10 in the past week    Time 2   Period Weeks   Status On-going   PT SHORT TERM GOAL #5   Title 2   Time 2   Period Weeks   Status Achieved           PT Long Term Goals - 05/12/15 1350  PT LONG TERM GOAL #1    Title Pt to be I in advance HEP   Time 4   Period Weeks   Status Achieved   PT LONG TERM GOAL #2   Title Pt ROM to be 2-115 to  allow pt to have a normalized gait and to be able to sit for an hour at a time with comfot to be able to dine   Baseline ROM6-99   Time 4   Period Weeks   Status On-going   PT LONG TERM GOAL #3   Title Pt strength to be at least 4+/5 to be able to walk for 30 mintues for better health habits.    Time 4   Period Weeks   Status Achieved   PT LONG TERM GOAL #4   Title Pt to be able to go up and down steps using one hand in a reciprical manner   Baseline just starting    Time 4   Period Weeks   Status On-going   PT LONG TERM GOAL #5   Title Pt pain to be no greater than a 3/10 to allow pt to sleep without waking from pain    Time 4   Period Weeks   Status On-going               Plan - 06/18/15 1222    Clinical Impression Statement Pt continues to have significant pitting edema throughout the anterior knee, pain in the posterior distal thigh (hamstrings R/O today), and difficulty donning JAS brace, which was improved after education and assistance. It remains difficult to identify any improvement in joint range at this time. Osteokinematics remain severely restricted in all positions, as iif is continuously in a close-packed position. Focused heavily on posterior tibial glides on the femur as well today, however, substantial amounts of force were required  to produce a glide enough for the patient to report posterior capsule stretching. Most painful restrictions remain around the region of the VMO. Pt continues to remain compliant with HEP, CPM, and plans to now utilize [place] brace at home, howevere it does cause worsening discomfort with time.    Pt will benefit from skilled therapeutic intervention in order to improve on the following deficits Abnormal gait;Decreased balance;Decreased endurance;Decreased range of motion;Decreased strength;Difficulty  walking;Hypomobility;Increased edema;Increased fascial restricitons;Pain   Clinical Impairments Affecting Rehab Potential 7 Surgeries total on this same knee leading to chronic periods of weakness.    PT Frequency 5x / week   PT Duration 2 weeks   PT Treatment/Interventions ADLs/Self Care Home Management;Gait training;Stair training;Functional mobility training;Therapeutic activities;Therapeutic exercise;Balance training;Patient/family education;Passive range of motion;Manual techniques   PT Next Visit Plan hamstrings bridges, continue with stretching into flexion, extension, and tibial anterior and posterior glides, as well as tibial medial rotation when knee is at 60 degrees. Patella mobs to tolerance. MFR to VMO.    PT Home Exercise Plan no change   Consulted and Agree with Plan of Care Patient        Problem List Patient Active Problem List   Diagnosis Date Noted  . Arthrofibrosis of knee joint 06/07/2015  . Status post right knee replacement 06/07/2015  . Primary osteoarthritis of right knee 04/05/2015  . Pain due to knee joint prosthesis ([place]) 04/05/2015  . Status post revision of total replacement of left knee 04/15/2013  . Pain due to total left knee replacement ([place]) 03/27/2013  . Muscle weakness (generalized) 07/02/2012  . Total knee replacement status 07/02/2012  . Difficulty  in walking(719.7) 07/02/2012  . Stiffness of left knee 07/02/2012  . Pain in left knee 07/02/2012    [name] C 06/18/2015, 12:40 PM  12:40 PM  [name], PT, DPT [place] License # [id number]       [facility] [place], [place], [address] Phone: [phone]   Fax:  [phone]  Name: [name] MRN: [id number] Date of Birth: Feb 02, 1956  PHYSICAL THERAPY DISCHARGE SUMMARY  Visits from Start of Care: 25  Current functional level related to goals / functional outcomes: *see above   Remaining deficits: *see above    Education / Equipment: *see above  Plan: Patient agrees to discharge.  Patient goals were not met. Patient is being discharged due to not returning since the last visit.  ?????     3:07 PM, 08/25/2015 [name], PT, DPT PRN Physical Therapist at [place] # [id number] [phone] office

## 2015-06-18 NOTE — Patient Instructions (Signed)
Reviewed in full, donning, doffing, and adjustment of JASS brace, as patient has not yet been able to utilize since she acquired it.

## 2015-06-21 ENCOUNTER — Encounter (HOSPITAL_COMMUNITY): Payer: Medicare Other

## 2015-06-23 ENCOUNTER — Encounter (HOSPITAL_COMMUNITY): Payer: Medicare Other | Admitting: Physical Therapy

## 2015-06-24 ENCOUNTER — Telehealth (HOSPITAL_COMMUNITY): Payer: Self-pay

## 2015-06-24 NOTE — Telephone Encounter (Signed)
She will see the MD on Jan 3  she will call us back after she see the MD to let us know if she needs to continue. NF

## 2015-06-25 ENCOUNTER — Ambulatory Visit (HOSPITAL_COMMUNITY): Payer: Medicare Other

## 2015-06-30 ENCOUNTER — Ambulatory Visit (HOSPITAL_COMMUNITY): Payer: Medicare Other

## 2015-07-02 ENCOUNTER — Ambulatory Visit (HOSPITAL_COMMUNITY): Payer: Medicare Other

## 2015-07-06 DIAGNOSIS — T8484XD Pain due to internal orthopedic prosthetic devices, implants and grafts, subsequent encounter: Secondary | ICD-10-CM | POA: Diagnosis not present

## 2015-07-29 DIAGNOSIS — M25561 Pain in right knee: Secondary | ICD-10-CM | POA: Diagnosis not present

## 2015-07-30 ENCOUNTER — Other Ambulatory Visit: Payer: Self-pay

## 2015-07-30 DIAGNOSIS — Z1231 Encounter for screening mammogram for malignant neoplasm of breast: Secondary | ICD-10-CM

## 2015-08-12 DIAGNOSIS — E782 Mixed hyperlipidemia: Secondary | ICD-10-CM | POA: Diagnosis not present

## 2015-08-12 DIAGNOSIS — M25561 Pain in right knee: Secondary | ICD-10-CM | POA: Diagnosis not present

## 2015-08-20 ENCOUNTER — Ambulatory Visit
Admission: RE | Admit: 2015-08-20 | Discharge: 2015-08-20 | Disposition: A | Discharge status: Home / Self Care | Payer: Medicare Other | Source: Ambulatory Visit

## 2015-08-20 DIAGNOSIS — Z1231 Encounter for screening mammogram for malignant neoplasm of breast: Secondary | ICD-10-CM

## 2015-09-09 DIAGNOSIS — T8482XD Fibrosis due to internal orthopedic prosthetic devices, implants and grafts, subsequent encounter: Secondary | ICD-10-CM | POA: Diagnosis not present

## 2015-10-07 DIAGNOSIS — M25562 Pain in left knee: Secondary | ICD-10-CM | POA: Diagnosis not present

## 2015-10-07 DIAGNOSIS — M25561 Pain in right knee: Secondary | ICD-10-CM | POA: Diagnosis not present

## 2016-01-05 DIAGNOSIS — M19042 Primary osteoarthritis, left hand: Secondary | ICD-10-CM | POA: Diagnosis not present

## 2016-01-05 DIAGNOSIS — M19039 Primary osteoarthritis, unspecified wrist: Secondary | ICD-10-CM | POA: Diagnosis not present

## 2016-01-05 DIAGNOSIS — M654 Radial styloid tenosynovitis [de Quervain]: Secondary | ICD-10-CM | POA: Diagnosis not present

## 2016-01-05 DIAGNOSIS — E785 Hyperlipidemia, unspecified: Secondary | ICD-10-CM | POA: Diagnosis not present

## 2016-01-05 DIAGNOSIS — M659 Synovitis and tenosynovitis, unspecified: Secondary | ICD-10-CM | POA: Diagnosis not present

## 2016-01-05 DIAGNOSIS — M19041 Primary osteoarthritis, right hand: Secondary | ICD-10-CM | POA: Diagnosis not present

## 2016-01-28 DIAGNOSIS — M19039 Primary osteoarthritis, unspecified wrist: Secondary | ICD-10-CM | POA: Diagnosis not present

## 2016-01-28 DIAGNOSIS — M654 Radial styloid tenosynovitis [de Quervain]: Secondary | ICD-10-CM | POA: Diagnosis not present

## 2016-01-28 DIAGNOSIS — M19032 Primary osteoarthritis, left wrist: Secondary | ICD-10-CM | POA: Diagnosis not present

## 2016-03-20 DIAGNOSIS — E782 Mixed hyperlipidemia: Secondary | ICD-10-CM | POA: Diagnosis not present

## 2016-03-20 DIAGNOSIS — T8484XA Pain due to internal orthopedic prosthetic devices, implants and grafts, initial encounter: Secondary | ICD-10-CM | POA: Diagnosis not present

## 2016-03-20 DIAGNOSIS — M1990 Unspecified osteoarthritis, unspecified site: Secondary | ICD-10-CM | POA: Diagnosis not present

## 2016-03-20 DIAGNOSIS — Z1389 Encounter for screening for other disorder: Secondary | ICD-10-CM | POA: Diagnosis not present

## 2016-04-11 DIAGNOSIS — R87615 Unsatisfactory cytologic smear of cervix: Secondary | ICD-10-CM | POA: Diagnosis not present

## 2016-04-11 DIAGNOSIS — Z01419 Encounter for gynecological examination (general) (routine) without abnormal findings: Secondary | ICD-10-CM | POA: Diagnosis not present

## 2016-08-30 DIAGNOSIS — Z87891 Personal history of nicotine dependence: Secondary | ICD-10-CM | POA: Diagnosis not present

## 2016-08-30 DIAGNOSIS — M19042 Primary osteoarthritis, left hand: Secondary | ICD-10-CM | POA: Diagnosis not present

## 2016-08-30 DIAGNOSIS — Z881 Allergy status to other antibiotic agents status: Secondary | ICD-10-CM | POA: Diagnosis not present

## 2016-08-30 DIAGNOSIS — Z79899 Other long term (current) drug therapy: Secondary | ICD-10-CM | POA: Diagnosis not present

## 2016-08-30 DIAGNOSIS — E785 Hyperlipidemia, unspecified: Secondary | ICD-10-CM | POA: Diagnosis not present

## 2016-10-03 ENCOUNTER — Other Ambulatory Visit: Payer: Self-pay | Admitting: Obstetrics and Gynecology

## 2016-10-03 DIAGNOSIS — Z1231 Encounter for screening mammogram for malignant neoplasm of breast: Secondary | ICD-10-CM

## 2016-10-10 DIAGNOSIS — Z96652 Presence of left artificial knee joint: Secondary | ICD-10-CM | POA: Diagnosis not present

## 2016-10-10 DIAGNOSIS — Z96651 Presence of right artificial knee joint: Secondary | ICD-10-CM | POA: Diagnosis not present

## 2016-10-10 DIAGNOSIS — M25561 Pain in right knee: Secondary | ICD-10-CM | POA: Diagnosis not present

## 2016-10-10 DIAGNOSIS — M25562 Pain in left knee: Secondary | ICD-10-CM | POA: Diagnosis not present

## 2016-10-24 ENCOUNTER — Ambulatory Visit: Payer: Medicare Other

## 2016-10-25 ENCOUNTER — Ambulatory Visit
Admission: RE | Admit: 2016-10-25 | Discharge: 2016-10-25 | Disposition: A | Discharge status: Home / Self Care | Payer: Medicare Other | Source: Ambulatory Visit | Attending: Obstetrics and Gynecology | Admitting: Obstetrics and Gynecology

## 2016-10-25 DIAGNOSIS — Z1231 Encounter for screening mammogram for malignant neoplasm of breast: Secondary | ICD-10-CM

## 2016-11-02 DIAGNOSIS — Z09 Encounter for follow-up examination after completed treatment for conditions other than malignant neoplasm: Secondary | ICD-10-CM | POA: Diagnosis not present

## 2016-11-02 DIAGNOSIS — Z96651 Presence of right artificial knee joint: Secondary | ICD-10-CM | POA: Diagnosis not present

## 2016-11-02 DIAGNOSIS — M25561 Pain in right knee: Secondary | ICD-10-CM | POA: Diagnosis not present

## 2016-11-02 DIAGNOSIS — M25562 Pain in left knee: Secondary | ICD-10-CM | POA: Diagnosis not present

## 2016-12-29 DIAGNOSIS — M19042 Primary osteoarthritis, left hand: Secondary | ICD-10-CM | POA: Diagnosis not present

## 2016-12-29 DIAGNOSIS — M19032 Primary osteoarthritis, left wrist: Secondary | ICD-10-CM | POA: Diagnosis not present

## 2016-12-29 DIAGNOSIS — M19039 Primary osteoarthritis, unspecified wrist: Secondary | ICD-10-CM | POA: Diagnosis not present

## 2017-01-29 DIAGNOSIS — R635 Abnormal weight gain: Secondary | ICD-10-CM | POA: Diagnosis not present

## 2017-01-29 DIAGNOSIS — Z6828 Body mass index (BMI) 28.0-28.9, adult: Secondary | ICD-10-CM | POA: Diagnosis not present

## 2017-01-29 DIAGNOSIS — R6882 Decreased libido: Secondary | ICD-10-CM | POA: Diagnosis not present

## 2017-02-27 DIAGNOSIS — R6882 Decreased libido: Secondary | ICD-10-CM | POA: Diagnosis not present

## 2017-03-26 DIAGNOSIS — Z6827 Body mass index (BMI) 27.0-27.9, adult: Secondary | ICD-10-CM | POA: Diagnosis not present

## 2017-03-26 DIAGNOSIS — E669 Obesity, unspecified: Secondary | ICD-10-CM | POA: Diagnosis not present

## 2017-03-26 DIAGNOSIS — R6882 Decreased libido: Secondary | ICD-10-CM | POA: Diagnosis not present

## 2017-05-01 DIAGNOSIS — Z01419 Encounter for gynecological examination (general) (routine) without abnormal findings: Secondary | ICD-10-CM | POA: Diagnosis not present

## 2017-05-01 DIAGNOSIS — Z779 Other contact with and (suspected) exposures hazardous to health: Secondary | ICD-10-CM | POA: Diagnosis not present

## 2017-05-01 DIAGNOSIS — Z6826 Body mass index (BMI) 26.0-26.9, adult: Secondary | ICD-10-CM | POA: Diagnosis not present

## 2017-05-01 DIAGNOSIS — R6882 Decreased libido: Secondary | ICD-10-CM | POA: Diagnosis not present

## 2017-05-18 DIAGNOSIS — M19032 Primary osteoarthritis, left wrist: Secondary | ICD-10-CM | POA: Diagnosis not present

## 2017-05-18 DIAGNOSIS — S63592D Other specified sprain of left wrist, subsequent encounter: Secondary | ICD-10-CM | POA: Diagnosis not present

## 2017-05-18 DIAGNOSIS — M19042 Primary osteoarthritis, left hand: Secondary | ICD-10-CM | POA: Diagnosis not present

## 2017-06-22 DIAGNOSIS — R6882 Decreased libido: Secondary | ICD-10-CM | POA: Diagnosis not present

## 2017-07-13 DIAGNOSIS — S63592D Other specified sprain of left wrist, subsequent encounter: Secondary | ICD-10-CM | POA: Diagnosis not present

## 2017-07-24 DIAGNOSIS — H1013 Acute atopic conjunctivitis, bilateral: Secondary | ICD-10-CM | POA: Diagnosis not present

## 2017-07-24 DIAGNOSIS — H04129 Dry eye syndrome of unspecified lacrimal gland: Secondary | ICD-10-CM | POA: Diagnosis not present

## 2017-07-30 DIAGNOSIS — R6882 Decreased libido: Secondary | ICD-10-CM | POA: Diagnosis not present

## 2017-08-09 DIAGNOSIS — H1013 Acute atopic conjunctivitis, bilateral: Secondary | ICD-10-CM | POA: Diagnosis not present

## 2017-08-21 DIAGNOSIS — M25532 Pain in left wrist: Secondary | ICD-10-CM | POA: Diagnosis not present

## 2017-08-21 DIAGNOSIS — G8929 Other chronic pain: Secondary | ICD-10-CM | POA: Diagnosis not present

## 2017-08-21 DIAGNOSIS — S63592D Other specified sprain of left wrist, subsequent encounter: Secondary | ICD-10-CM | POA: Diagnosis not present

## 2017-08-21 DIAGNOSIS — M19132 Post-traumatic osteoarthritis, left wrist: Secondary | ICD-10-CM | POA: Diagnosis not present

## 2017-08-31 DIAGNOSIS — Z9889 Other specified postprocedural states: Secondary | ICD-10-CM | POA: Diagnosis not present

## 2017-08-31 DIAGNOSIS — Z4789 Encounter for other orthopedic aftercare: Secondary | ICD-10-CM | POA: Diagnosis not present

## 2017-10-01 ENCOUNTER — Other Ambulatory Visit: Payer: Self-pay | Admitting: Obstetrics and Gynecology

## 2017-10-01 DIAGNOSIS — Z1231 Encounter for screening mammogram for malignant neoplasm of breast: Secondary | ICD-10-CM

## 2017-10-05 DIAGNOSIS — M19039 Primary osteoarthritis, unspecified wrist: Secondary | ICD-10-CM | POA: Diagnosis not present

## 2017-10-05 DIAGNOSIS — M19132 Post-traumatic osteoarthritis, left wrist: Secondary | ICD-10-CM | POA: Diagnosis not present

## 2017-10-10 ENCOUNTER — Encounter (HOSPITAL_COMMUNITY): Payer: Self-pay | Admitting: Occupational Therapy

## 2017-10-10 ENCOUNTER — Other Ambulatory Visit: Payer: Self-pay

## 2017-10-10 ENCOUNTER — Ambulatory Visit (HOSPITAL_COMMUNITY): Payer: Medicare Other | Attending: Physician Assistant | Admitting: Occupational Therapy

## 2017-10-10 DIAGNOSIS — M25532 Pain in left wrist: Secondary | ICD-10-CM | POA: Insufficient documentation

## 2017-10-10 DIAGNOSIS — M25632 Stiffness of left wrist, not elsewhere classified: Secondary | ICD-10-CM | POA: Insufficient documentation

## 2017-10-10 DIAGNOSIS — R29898 Other symptoms and signs involving the musculoskeletal system: Secondary | ICD-10-CM | POA: Diagnosis not present

## 2017-10-10 NOTE — Patient Instructions (Signed)
Paper Crumpling Exercise   Begin with right palm down on piece of paper or hand towel. Maintaining contact between surface and heel of hand, crumple paper into a ball. Repeat __10__ times per set. Do __1__ sets per session. Do ___2_ sessions per day.  Copyright  VHI. All rights reserved.     Towel Roll Squeeze   With right forearm resting on surface, gently squeeze towel. Repeat _10___ times per set. Do __1__ sets per session. Do __2__ sessions per day.     Home Exercises Program Theraputty Exercises  Do the following exercises 2 times a day using your affected hand.  1. Roll putty into a ball.  2. Make into a pancake.  3. Roll putty into a roll.  4. Pinch along log with first finger and thumb.   5. Make into a ball.  6. Roll it back into a log.   7. Pinch using thumb and side of first finger.  8. Roll into a ball, then flatten into a pancake.

## 2017-10-11 ENCOUNTER — Encounter (HOSPITAL_COMMUNITY): Payer: Self-pay | Admitting: Occupational Therapy

## 2017-10-11 ENCOUNTER — Ambulatory Visit (HOSPITAL_COMMUNITY): Payer: Medicare Other | Admitting: Occupational Therapy

## 2017-10-11 DIAGNOSIS — M25632 Stiffness of left wrist, not elsewhere classified: Secondary | ICD-10-CM

## 2017-10-11 DIAGNOSIS — M25532 Pain in left wrist: Secondary | ICD-10-CM

## 2017-10-11 DIAGNOSIS — R29898 Other symptoms and signs involving the musculoskeletal system: Secondary | ICD-10-CM

## 2017-10-11 NOTE — Therapy (Signed)
[place], [place], [address] Phone: [phone]   Fax:  [phone]  Occupational Therapy Treatment  Patient Details  Name: [name] MRN: [id number] Date of Birth: [birth year] Referring Provider: Velda Shell, Pa-C   Encounter Date: 10/11/2017  OT End of Session - 10/11/17 1503    Visit Number  2    Number of Visits  9    Date for OT Re-Evaluation  11/10/17    Authorization Type  BCBS Medicare    Authorization Time Period  visits based on medical necessity; $40 copay    OT Start Time  1351    OT Stop Time  1434    OT Time Calculation (min)  43 min    Activity Tolerance  Patient tolerated treatment well    Behavior During Therapy  [facility] for tasks assessed/performed       Past Medical History:  Diagnosis Date  . Arthritis   . Cancer [facility]) 1978    cervical cancer  . Headache(784.0)   . History of migraine    last one was on 06/02/12;takes Topamax bid and Imitrex prn  . Hyperlipidemia    takes Simvastatin nightly  . Insomnia    takes Trazodone nightly  . Joint pain   . Joint swelling   . Seasonal allergies    in spring    Past Surgical History:  Procedure Laterality Date  . ABDOMINAL HYSTERECTOMY  1978  . APPENDECTOMY  2008  . ARTROPLASTY    . bladder tacked  1999  . BREAST EXCISIONAL BIOPSY    . BREAST SURGERY  2009   cyst removed from left breast  . COLONOSCOPY    . cyst removed from right knee   1972  . KNEE CLOSED REDUCTION Right 06/07/2015   Procedure: CLOSED MANIPULATION RIGHT KNEE;  Surgeon: Dorna Leitz, MD;  Location: [place];  Service: Orthopedics;  Laterality: Right;  . left knee surgery  1973  . left knee surgery  2012  . left knee surgery  2012  . right carpal tunnel    . right knee arthroscopy  90's    x 3  . right knee partial replacement  1991  . right wrist sugery  2008  . right wrist surgery    . scar tissue removed  2008  . TOTAL KNEE ARTHROPLASTY   06/12/2012   LEFT KNEE  . TOTAL KNEE ARTHROPLASTY  06/12/2012   Procedure: TOTAL KNEE ARTHROPLASTY;  Surgeon: Ninetta Lights, MD;  Location: [place];  Service: Orthopedics;  Laterality: Left;  . TOTAL KNEE ARTHROPLASTY Right 04/05/2015  . TOTAL KNEE REVISION Left 03/26/2013   Procedure: TOTAL KNEE REVISION- left;  Surgeon: Alta Corning, MD;  Location: [place];  Service: Orthopedics;  Laterality: Left;  . TOTAL KNEE REVISION Right 04/05/2015   Procedure: TOTAL KNEE REVISION;  Surgeon: Dorna Leitz, MD;  Location: [place];  Service: Orthopedics;  Laterality: Right;    There were no vitals filed for this visit.  Subjective Assessment - 10/11/17 1350    Subjective   S: I was so stiff this morning.     Currently in Pain?  Yes    Pain Score  6     Pain Location  Wrist    Pain Orientation  Left    Pain Descriptors / Indicators  Aching;Sore    Pain Type  Acute pain    Pain Radiating Towards  none    Pain Onset  1 to 4 weeks ago    Pain Frequency  Intermittent    Aggravating Factors   movement    Pain Relieving Factors  heat    Effect of Pain on Daily Activities  mod effect on ADLs         [facility] OT Assessment - 10/10/17 1431      Assessment   Medical Diagnosis  SLAC    Referring Provider  Velda Shell, Pa-C    Onset Date/Surgical Date  08/21/17    Hand Dominance  Right    Next MD Visit  12/2017    Prior Therapy  none      Precautions   Precautions  None    Required Braces or Orthoses  Other Brace/Splint    Other Brace/Splint  wrist brace-as pt feels she needs               OT Treatments/Exercises (OP) - 10/11/17 1353      Exercises   Exercises  Wrist;Hand;Elbow      Elbow Exercises   Forearm Supination  PROM;5 reps;AROM;10 reps    Forearm Pronation  PROM;5 reps;AROM;10 reps      Weighted Stretch Over Towel Roll   Wrist Flexion - Weighted Stretch  1 pound;60 seconds    Wrist Extension - Weighted Stretch  1 pound;60 seconds      Wrist Exercises   Wrist Flexion   PROM;AROM;10 reps    Wrist Extension  PROM;AROM;10 reps    Wrist Radial Deviation  PROM;10 reps    Wrist Ulnar Deviation  PROM;10 reps      Additional Wrist Exercises   Sponges  Pt used green clothespin and 3 point pinch to grasp and place 25 high resistance sponges into bucket.       Manual Therapy   Manual Therapy  Myofascial release    Manual therapy comments  Manual therapy completed separately from therapeutic exercise    Myofascial Release  Myofascial release completed to palm at 3rd digit, volar and dorsal wrist along with scar massage to decrease fascial restrictions and pain and increase joint range of motion in left wrist             OT Education - 10/11/17 1417    Education provided  Yes    Education Details  wrist A/ROM    Person(s) Educated  Patient    Methods  Explanation;Demonstration;Handout    Comprehension  Verbalized understanding;Returned demonstration       OT Short Term Goals - 10/11/17 1505      OT SHORT TERM GOAL #1   Title  Pt will be provided with and educated on HEP to improve wrist mobility required for functional task completion.     Time  4    Period  Weeks    Status  On-going      OT SHORT TERM GOAL #2   Title  Pt will decrease pain in left wrist to 2/10 or less during daily tasks to improve use of left wrist as non-dominant during ADLs.     Time  4    Period  Weeks    Status  On-going      OT SHORT TERM GOAL #3   Title  Pt will improve left wrist ROM to [facility] to improve ability to perform grooming tasks.     Time  4    Period  Weeks    Status  On-going      OT SHORT TERM GOAL #4   Title  Pt will improve left wrist strength to 4/5 or better to improve ability to perform quilting tasks.     Time  4    Period  Weeks    Status  On-going      OT SHORT TERM GOAL #5   Title  Pt will improve left grip strength by 20# and pinch strength by 4# to improve ability to lift/move/carry pots and pans during cooking tasks.     Time  4    Period   Weeks    Status  On-going               Plan - 10/11/17 1503    Clinical Impression Statement  A: Initiated manual therapy for left wrist and palm to decrease soft tissue restrictions and tendon tightness and improve ROM. Initiated passive and active ROM, with with notable improvement in ROM since evaluation yesterday. Verbal cuing for form and technique during completion. HEP updated.     Plan  P: Follow up on HEP, continue with A/ROM exercises and weighted stretches, add grip strengthening       Patient will benefit from skilled therapeutic intervention in order to improve the following deficits and impairments:  Decreased scar mobility, Decreased activity tolerance, Decreased strength, Impaired flexibility, Pain, Increased edema, Decreased range of motion, Increased fascial restrictions, Impaired UE functional use  Visit Diagnosis: Pain in left wrist  Other symptoms and signs involving the musculoskeletal system  Stiffness of left wrist, not elsewhere classified    Problem List Patient Active Problem List   Diagnosis Date Noted  . Arthrofibrosis of knee joint 06/07/2015  . Status post right knee replacement 06/07/2015  . Primary osteoarthritis of right knee 04/05/2015  . Pain due to knee joint prosthesis ([place]) 04/05/2015  . Status post revision of total replacement of left knee 04/15/2013  . Pain due to total left knee replacement ([place]) 03/27/2013  . Muscle weakness (generalized) 07/02/2012  . Total knee replacement status 07/02/2012  . Difficulty in walking(719.7) 07/02/2012  . Stiffness of left knee 07/02/2012  . Pain in left knee 07/02/2012   Guadelupe Sabin, OTR/L  [phone] 10/11/2017, 3:27 PM  [facility] [address] [place], [place], [address] Phone: [phone]   Fax:  [phone]  Name: [name] MRN: [id number] Date of Birth: Jul 23, 1955

## 2017-10-11 NOTE — Therapy (Signed)
[place], [place], [address] Phone: [phone]   Fax:  [phone]  Occupational Therapy Evaluation  Patient Details  Name: [name] MRN: [id number] Date of Birth: Nov 21, 1955 Referring Provider: Velda Shell, Pa-C   Encounter Date: 10/10/2017  OT End of Session - 10/11/17 0844    Visit Number  1    Number of Visits  9    Date for OT Re-Evaluation  11/10/17    Authorization Type  [employer] Medicare    Authorization Time Period  visits based on medical necessity; $40 copay    OT Start Time  1436    OT Stop Time  1514    OT Time Calculation (min)  38 min    Activity Tolerance  Patient tolerated treatment well    Behavior During Therapy  [facility] for tasks assessed/performed       Past Medical History:  Diagnosis Date  . Arthritis   . Cancer [facility]) 1978    cervical cancer  . Headache(784.0)   . History of migraine    last one was on 06/02/12;takes Topamax bid and Imitrex prn  . Hyperlipidemia    takes Simvastatin nightly  . Insomnia    takes Trazodone nightly  . Joint pain   . Joint swelling   . Seasonal allergies    in spring    Past Surgical History:  Procedure Laterality Date  . ABDOMINAL HYSTERECTOMY  1978  . APPENDECTOMY  2008  . ARTROPLASTY    . bladder tacked  1999  . BREAST EXCISIONAL BIOPSY    . BREAST SURGERY  2009   cyst removed from left breast  . COLONOSCOPY    . cyst removed from right knee   1972  . KNEE CLOSED REDUCTION Right 06/07/2015   Procedure: CLOSED MANIPULATION RIGHT KNEE;  Surgeon: Dorna Leitz, MD;  Location: [place];  Service: Orthopedics;  Laterality: Right;  . left knee surgery  1973  . left knee surgery  2012  . left knee surgery  2012  . right carpal tunnel    . right knee arthroscopy  90's    x 3  . right knee partial replacement  1991  . right wrist sugery  2008  . right wrist surgery    . scar tissue removed  2008  . TOTAL KNEE ARTHROPLASTY   06/12/2012   LEFT KNEE  . TOTAL KNEE ARTHROPLASTY  06/12/2012   Procedure: TOTAL KNEE ARTHROPLASTY;  Surgeon: Ninetta Lights, MD;  Location: [place];  Service: Orthopedics;  Laterality: Left;  . TOTAL KNEE ARTHROPLASTY Right 04/05/2015  . TOTAL KNEE REVISION Left 03/26/2013   Procedure: TOTAL KNEE REVISION- left;  Surgeon: Alta Corning, MD;  Location: [place];  Service: Orthopedics;  Laterality: Left;  . TOTAL KNEE REVISION Right 04/05/2015   Procedure: TOTAL KNEE REVISION;  Surgeon: Dorna Leitz, MD;  Location: [place];  Service: Orthopedics;  Laterality: Right;    There were no vitals filed for this visit.  Subjective Assessment - 10/11/17 0840    Subjective   S: I had the other wrist done a few years ago    Pertinent History  Pt is a 62 y/o female s/p left SLAC wrist surgery on 08/21/17 presenting with functional deficits limiting task completion. Pt had right wrist SLAC sx several years ago as well, has limited ROM however is pleased with her functional level. Pt was referred to occupational therapy for evaluation and treatment by  Randal Parks, Pa-C.     Patient Stated Goals  To be able to use my left wrist functionally.     Currently in Pain?  No/denies        [facility] OT Assessment - 10/10/17 1431      Assessment   Medical Diagnosis  SLAC    Referring Provider  Velda Shell, Pa-C    Onset Date/Surgical Date  08/21/17    Hand Dominance  Right    Next MD Visit  12/2017    Prior Therapy  none      Precautions   Precautions  None    Required Braces or Orthoses  Other Brace/Splint    Other Brace/Splint  wrist brace-as pt feels she needs      Balance Screen   Has the patient fallen in the past 6 months  No    Has the patient had a decrease in activity level because of a fear of falling?   No    Is the patient reluctant to leave their home because of a fear of falling?   No      Prior Function   Level of Independence  Independent    Vocation  Retired    Hospital doctor      ADL    ADL comments  Pt is having difficulty with pushing up from surfaces, grooming-cupping water to wash face, lifting pots and pans, all ADLs requiring BUE integration      Written Expression   Dominant Hand  Right      Cognition   Overall Cognitive Status  Within Functional Limits for tasks assessed      ROM / Strength   AROM / PROM / Strength  AROM;PROM;Strength      Palpation   Palpation comment  min/mod fascial restrictions along left volar and dorsal wrist and forearm, tight tendon at base of 3rd MCP into palm      AROM   Overall AROM   Deficits    AROM Assessment Site  Wrist;Finger;Forearm    Right/Left Forearm  Left    Left Forearm Pronation  90 Degrees    Left Forearm Supination  80 Degrees    Right/Left Wrist  Left    Left Wrist Extension  20 Degrees    Left Wrist Flexion  8 Degrees    Left Wrist Radial Deviation  16 Degrees    Left Wrist Ulnar Deviation  4 Degrees      PROM   Overall PROM   Deficits    PROM Assessment Site  Wrist;Forearm    Right/Left Forearm  Left    Left Forearm Pronation  90 Degrees    Left Forearm Supination  90 Degrees    Right/Left Wrist  Left    Left Wrist Extension  30 Degrees    Left Wrist Flexion  16 Degrees    Left Wrist Radial Deviation  25 Degrees    Left Wrist Ulnar Deviation  8 Degrees      Strength   Overall Strength  Deficits    Overall Strength Comments  unable to assess wrist strength at this time due to pain and ROM deficits    Strength Assessment Site  Hand    Right/Left hand  Left;Right    Right Hand Grip (lbs)  54    Right Hand Lateral Pinch  17 lbs    Right Hand 3 Point Pinch  14 lbs    Left Hand Gross Grasp  Functional    Left  Hand Grip (lbs)  20    Left Hand Lateral Pinch  11 lbs    Left Hand 3 Point Pinch  7 lbs                      OT Education - 10/11/17 0839    Education provided  Yes    Education Details  theraputty for grip/pinch strengthening, hand ROM    Person(s) Educated  Patient     Methods  Explanation;Demonstration;Handout    Comprehension  Verbalized understanding;Returned demonstration       OT Short Term Goals - 10/11/17 0849      OT SHORT TERM GOAL #1   Title  Pt will be provided with and educated on HEP to improve wrist mobility required for functional task completion.     Time  4    Period  Weeks    Status  New    Target Date  11/10/17      OT SHORT TERM GOAL #2   Title  Pt will decrease pain in left wrist to 2/10 or less during daily tasks to improve use of left wrist as non-dominant during ADLs.     Time  4    Period  Weeks    Status  New      OT SHORT TERM GOAL #3   Title  Pt will improve left wrist ROM to [facility] to improve ability to perform grooming tasks.     Time  4    Period  Weeks    Status  New      OT SHORT TERM GOAL #4   Title  Pt will improve left wrist strength to 4/5 or better to improve ability to perform quilting tasks.     Time  4    Period  Weeks    Status  New      OT SHORT TERM GOAL #5   Title  Pt will improve left grip strength by 20# and pinch strength by 4# to improve ability to lift/move/carry pots and pans during cooking tasks.     Time  4    Period  Weeks    Status  New               Plan - 10/11/17 0844    Clinical Impression Statement  A: Pt is a 62 y/o female s/p left wrist SLAC procedure, presenting for evaluation wearing a prefabricated wrist brace. Pt with functional deficits in ROM and strength, as well as increased pain and fascial restrictions. Pt also with grip and pinch strength deficits. Pt will begin therapy 2x/week however may decrease to 1x/week if comfortable with HEP completion due to high copay.     Occupational Profile and client history currently impacting functional performance  Pt is independent and motivated to return to highest level of functioning, pt is familiar with rehabilitation procedures as she had the same procedure on the right wrist several years ago    Occupational performance  deficits (Please refer to evaluation for details):  ADL's;IADL's;Rest and Sleep;Leisure;Social Participation    Rehab Potential  Good    OT Frequency  2x / week    OT Duration  4 weeks    OT Treatment/Interventions  Self-care/ADL training;Therapeutic exercise;Ultrasound;Manual Therapy;Splinting;Therapeutic activities;Cryotherapy;Paraffin;Electrical Stimulation;Scar mobilization;Moist Heat;Passive range of motion;Patient/family education    Plan  P: Pt will benefit from skilled OT services to decrease pain and fascial restrictions, increase ROM, strength, and functional use of left hand and wrist. Treatment  plan: myofascial release, manual therapy, P/ROM, A/ROM, gentle wrist strengthening, grip and pinch strengthening, modalities prn    Clinical Decision Making  Limited treatment options, no task modification necessary    Consulted and Agree with Plan of Care  Patient       Patient will benefit from skilled therapeutic intervention in order to improve the following deficits and impairments:  Decreased scar mobility, Decreased activity tolerance, Decreased strength, Impaired flexibility, Pain, Increased edema, Decreased range of motion, Increased fascial restrictions, Impaired UE functional use  Visit Diagnosis: Pain in left wrist  Other symptoms and signs involving the musculoskeletal system  Stiffness of left wrist, not elsewhere classified    Problem List Patient Active Problem List   Diagnosis Date Noted  . Arthrofibrosis of knee joint 06/07/2015  . Status post right knee replacement 06/07/2015  . Primary osteoarthritis of right knee 04/05/2015  . Pain due to knee joint prosthesis ([place]) 04/05/2015  . Status post revision of total replacement of left knee 04/15/2013  . Pain due to total left knee replacement ([place]) 03/27/2013  . Muscle weakness (generalized) 07/02/2012  . Total knee replacement status 07/02/2012  . Difficulty in walking(719.7) 07/02/2012  . Stiffness of left knee  07/02/2012  . Pain in left knee 07/02/2012   Guadelupe Sabin, OTR/L  [phone] 10/11/2017, 8:58 AM  [place] [address] [place], [place], [address] Phone: [phone]   Fax:  [phone]  Name: [name] MRN: [id number] Date of Birth: 1956/02/28

## 2017-10-11 NOTE — Patient Instructions (Signed)
AROM Exercises   1) Wrist Flexion  Start with wrist at edge of table, palm facing up. With wrist hanging slightly off table, curl wrist upward, and back down.      2) Wrist Extension  Start with wrist at edge of table, palm facing down. With wrist slightly off the edge of the table, curl wrist up and back down.      3) Radial Deviations  Start with forearm flat against a table, wrist hanging slightly off the edge, and palm facing the wall. Bending at the wrist only, and keeping palm facing the wall, bend wrist so fist is pointing towards the floor, back up to start position, and up towards the ceiling. Return to start.        4) WRIST PRONATION  Turn your forearm towards palm face down.  Keep your elbow bent and by the side of your  Body.      5) WRIST SUPINATION  Turn your forearm towards palm face up.  Keep your elbow bent and by the side of your  Body.      *Complete exercises ___10-15___ times each, ___2____ times per day*

## 2017-10-16 ENCOUNTER — Other Ambulatory Visit: Payer: Self-pay

## 2017-10-16 ENCOUNTER — Encounter (HOSPITAL_COMMUNITY): Payer: Self-pay

## 2017-10-16 ENCOUNTER — Ambulatory Visit (HOSPITAL_COMMUNITY): Payer: Medicare Other

## 2017-10-16 DIAGNOSIS — R29898 Other symptoms and signs involving the musculoskeletal system: Secondary | ICD-10-CM | POA: Diagnosis not present

## 2017-10-16 DIAGNOSIS — M25632 Stiffness of left wrist, not elsewhere classified: Secondary | ICD-10-CM

## 2017-10-16 DIAGNOSIS — M25532 Pain in left wrist: Secondary | ICD-10-CM | POA: Diagnosis not present

## 2017-10-16 NOTE — Therapy (Signed)
[place], [place], [address] Phone: [phone]   Fax:  [phone]  Occupational Therapy Treatment  Patient Details  Name: [name] MRN: [id number] Date of Birth: 27-Apr-1956 Referring Provider: Velda Shell, Pa-C   Encounter Date: 10/16/2017  OT End of Session - 10/16/17 1631    Visit Number  3    Number of Visits  9    Date for OT Re-Evaluation  11/10/17    Authorization Type  [employer] Medicare    Authorization Time Period  visits based on medical necessity; $40 copay    OT Start Time  1440    OT Stop Time  1515    OT Time Calculation (min)  35 min    Activity Tolerance  Patient tolerated treatment well    Behavior During Therapy  [facility] for tasks assessed/performed       Past Medical History:  Diagnosis Date  . Arthritis   . Cancer [facility]) 1978    cervical cancer  . Headache(784.0)   . History of migraine    last one was on 06/02/12;takes Topamax bid and Imitrex prn  . Hyperlipidemia    takes Simvastatin nightly  . Insomnia    takes Trazodone nightly  . Joint pain   . Joint swelling   . Seasonal allergies    in spring    Past Surgical History:  Procedure Laterality Date  . ABDOMINAL HYSTERECTOMY  1978  . APPENDECTOMY  2008  . ARTROPLASTY    . bladder tacked  1999  . BREAST EXCISIONAL BIOPSY    . BREAST SURGERY  2009   cyst removed from left breast  . COLONOSCOPY    . cyst removed from right knee   1972  . KNEE CLOSED REDUCTION Right 06/07/2015   Procedure: CLOSED MANIPULATION RIGHT KNEE;  Surgeon: Dorna Leitz, MD;  Location: [place];  Service: Orthopedics;  Laterality: Right;  . left knee surgery  1973  . left knee surgery  2012  . left knee surgery  2012  . right carpal tunnel    . right knee arthroscopy  90's    x 3  . right knee partial replacement  1991  . right wrist sugery  2008  . right wrist surgery    . scar tissue removed  2008  . TOTAL KNEE ARTHROPLASTY   06/12/2012   LEFT KNEE  . TOTAL KNEE ARTHROPLASTY  06/12/2012   Procedure: TOTAL KNEE ARTHROPLASTY;  Surgeon: Ninetta Lights, MD;  Location: [place];  Service: Orthopedics;  Laterality: Left;  . TOTAL KNEE ARTHROPLASTY Right 04/05/2015  . TOTAL KNEE REVISION Left 03/26/2013   Procedure: TOTAL KNEE REVISION- left;  Surgeon: Alta Corning, MD;  Location: [place];  Service: Orthopedics;  Laterality: Left;  . TOTAL KNEE REVISION Right 04/05/2015   Procedure: TOTAL KNEE REVISION;  Surgeon: Dorna Leitz, MD;  Location: [place];  Service: Orthopedics;  Laterality: Right;    There were no vitals filed for this visit.  Subjective Assessment - 10/16/17 1506    Subjective   S: It's just so stiff.    Currently in Pain?  Yes    Pain Score  5     Pain Location  Wrist    Pain Orientation  Left    Pain Descriptors / Indicators  Aching;Sore    Pain Type  Acute pain         [facility] OT Assessment - 10/16/17 1507  Assessment   Medical Diagnosis  SLAC      Precautions   Precautions  None    Required Braces or Orthoses  Other Brace/Splint    Other Brace/Splint  wrist brace-as pt feels she needs               OT Treatments/Exercises (OP) - 10/16/17 1507      Exercises   Exercises  Wrist;Hand;Elbow      Elbow Exercises   Forearm Supination  PROM;5 reps;AROM;10 reps    Forearm Pronation  PROM;5 reps;AROM;10 reps      Additional Elbow Exercises   Hand Gripper with Large Beads  patient was able to pick up one large bead with gripper set at 11#      Weighted Stretch Over Towel Roll   Wrist Flexion - Weighted Stretch  1 pound;2 pounds;60 seconds    Wrist Extension - Weighted Stretch  1 pound;60 seconds      Wrist Exercises   Wrist Flexion  PROM;AROM;10 reps    Wrist Extension  PROM;AROM;10 reps    Wrist Radial Deviation  PROM;10 reps    Wrist Ulnar Deviation  PROM;10 reps      Manual Therapy   Manual Therapy  Myofascial release    Manual therapy comments  Manual therapy completed  separately from therapeutic exercise    Myofascial Release  Myofascial release completed to palm at 3rd digit, volar and dorsal wrist along with scar massage to decrease fascial restrictions and pain and increase joint range of motion in left wrist               OT Short Term Goals - 10/11/17 1505      OT SHORT TERM GOAL #1   Title  Pt will be provided with and educated on HEP to improve wrist mobility required for functional task completion.     Time  4    Period  Weeks    Status  On-going      OT SHORT TERM GOAL #2   Title  Pt will decrease pain in left wrist to 2/10 or less during daily tasks to improve use of left wrist as non-dominant during ADLs.     Time  4    Period  Weeks    Status  On-going      OT SHORT TERM GOAL #3   Title  Pt will improve left wrist ROM to [facility] to improve ability to perform grooming tasks.     Time  4    Period  Weeks    Status  On-going      OT SHORT TERM GOAL #4   Title  Pt will improve left wrist strength to 4/5 or better to improve ability to perform quilting tasks.     Time  4    Period  Weeks    Status  On-going      OT SHORT TERM GOAL #5   Title  Pt will improve left grip strength by 20# and pinch strength by 4# to improve ability to lift/move/carry pots and pans during cooking tasks.     Time  4    Period  Weeks    Status  On-going               Plan - 10/16/17 1632    Clinical Impression Statement  A: Attempted handgripper task at end of session for grip strengthening. patient was able to pick one large bead with gripper set at 11#. During passive  stretching, therapist was able to increase ROM slightly withiin patient's pain tolerance. Increased ROM did not show with therapy activities. Did increase weighted stretch for flexion with 2#.    Plan  P: Continue with manual therapy to decrease fascial restrictions as needed. Complete grip strengthening with handgripper (pick up all large and medium beads)    Consulted and  Agree with Plan of Care  Patient       Patient will benefit from skilled therapeutic intervention in order to improve the following deficits and impairments:  Decreased scar mobility, Decreased activity tolerance, Decreased strength, Impaired flexibility, Pain, Increased edema, Decreased range of motion, Increased fascial restrictions, Impaired UE functional use  Visit Diagnosis: Pain in left wrist  Other symptoms and signs involving the musculoskeletal system  Stiffness of left wrist, not elsewhere classified    Problem List Patient Active Problem List   Diagnosis Date Noted  . Arthrofibrosis of knee joint 06/07/2015  . Status post right knee replacement 06/07/2015  . Primary osteoarthritis of right knee 04/05/2015  . Pain due to knee joint prosthesis ([place]) 04/05/2015  . Status post revision of total replacement of left knee 04/15/2013  . Pain due to total left knee replacement ([place]) 03/27/2013  . Muscle weakness (generalized) 07/02/2012  . Total knee replacement status 07/02/2012  . Difficulty in walking(719.7) 07/02/2012  . Stiffness of left knee 07/02/2012  . Pain in left knee 07/02/2012   Ailene Ravel, OTR/L,CBIS  [phone]  10/16/2017, 4:36 PM  [place] [address] [place], [place], [address] Phone: [phone]   Fax:  [phone]  Name: [name] MRN: [id number] Date of Birth: 03/17/1956

## 2017-10-18 ENCOUNTER — Ambulatory Visit (HOSPITAL_COMMUNITY): Payer: Medicare Other | Admitting: Occupational Therapy

## 2017-10-18 ENCOUNTER — Encounter (HOSPITAL_COMMUNITY): Payer: Self-pay | Admitting: Occupational Therapy

## 2017-10-18 DIAGNOSIS — R29898 Other symptoms and signs involving the musculoskeletal system: Secondary | ICD-10-CM | POA: Diagnosis not present

## 2017-10-18 DIAGNOSIS — M25632 Stiffness of left wrist, not elsewhere classified: Secondary | ICD-10-CM | POA: Diagnosis not present

## 2017-10-18 DIAGNOSIS — M25532 Pain in left wrist: Secondary | ICD-10-CM | POA: Diagnosis not present

## 2017-10-18 NOTE — Therapy (Signed)
[place], [place], [address] Phone: [phone]   Fax:  [phone]  Occupational Therapy Treatment  Patient Details  Name: [name] MRN: [id number] Date of Birth: 06-10-1956 Referring Provider: Velda Shell, Pa-C   Encounter Date: 10/18/2017  OT End of Session - 10/18/17 1517    Visit Number  4    Number of Visits  9    Date for OT Re-Evaluation  11/10/17    Authorization Type  BC[employer] Medicare    Authorization Time Period  visits based on medical necessity; $40 copay    OT Start Time  1433    OT Stop Time  1515    OT Time Calculation (min)  42 min    Activity Tolerance  Patient tolerated treatment well    Behavior During Therapy  [facility] for tasks assessed/performed       Past Medical History:  Diagnosis Date  . Arthritis   . Cancer [facility]) 1978    cervical cancer  . Headache(784.0)   . History of migraine    last one was on 06/02/12;takes Topamax bid and Imitrex prn  . Hyperlipidemia    takes Simvastatin nightly  . Insomnia    takes Trazodone nightly  . Joint pain   . Joint swelling   . Seasonal allergies    in spring    Past Surgical History:  Procedure Laterality Date  . ABDOMINAL HYSTERECTOMY  1978  . APPENDECTOMY  2008  . ARTROPLASTY    . bladder tacked  1999  . BREAST EXCISIONAL BIOPSY    . BREAST SURGERY  2009   cyst removed from left breast  . COLONOSCOPY    . cyst removed from right knee   1972  . KNEE CLOSED REDUCTION Right 06/07/2015   Procedure: CLOSED MANIPULATION RIGHT KNEE;  Surgeon: Dorna Leitz, MD;  Location: [place];  Service: Orthopedics;  Laterality: Right;  . left knee surgery  1973  . left knee surgery  2012  . left knee surgery  2012  . right carpal tunnel    . right knee arthroscopy  90's    x 3  . right knee partial replacement  1991  . right wrist sugery  2008  . right wrist surgery    . scar tissue removed  2008  . TOTAL KNEE ARTHROPLASTY   06/12/2012   LEFT KNEE  . TOTAL KNEE ARTHROPLASTY  06/12/2012   Procedure: TOTAL KNEE ARTHROPLASTY;  Surgeon: Ninetta Lights, MD;  Location: [place];  Service: Orthopedics;  Laterality: Left;  . TOTAL KNEE ARTHROPLASTY Right 04/05/2015  . TOTAL KNEE REVISION Left 03/26/2013   Procedure: TOTAL KNEE REVISION- left;  Surgeon: Alta Corning, MD;  Location: [place];  Service: Orthopedics;  Laterality: Left;  . TOTAL KNEE REVISION Right 04/05/2015   Procedure: TOTAL KNEE REVISION;  Surgeon: Dorna Leitz, MD;  Location: [place];  Service: Orthopedics;  Laterality: Right;    There were no vitals filed for this visit.  Subjective Assessment - 10/18/17 1434    Subjective   S: I've been up since 5am.     Currently in Pain?  Yes    Pain Score  7     Pain Location  Wrist    Pain Orientation  Left    Pain Descriptors / Indicators  Aching;Sore    Pain Type  Acute pain    Pain Radiating Towards  none    Pain Onset  1  to 4 weeks ago    Pain Frequency  Intermittent    Aggravating Factors   movement    Pain Relieving Factors  heat    Effect of Pain on Daily Activities  mod effect on ADLs    Multiple Pain Sites  No         [facility] OT Assessment - 10/18/17 1432      Assessment   Medical Diagnosis  SLAC      Precautions   Precautions  None    Required Braces or Orthoses  Other Brace/Splint    Other Brace/Splint  wrist brace-as pt feels she needs               OT Treatments/Exercises (OP) - 10/18/17 1435      Exercises   Exercises  Wrist;Hand;Elbow      Elbow Exercises   Forearm Supination  PROM;5 reps;AROM;10 reps    Forearm Pronation  PROM;5 reps;AROM;10 reps      Additional Elbow Exercises   Hand Gripper with Large Beads  all beads gripper at 7#    Hand Gripper with Medium Beads  all beads gripper at 7#      Weighted Stretch Over Towel Roll   Wrist Flexion - Weighted Stretch  2 pounds;60 seconds    Wrist Extension - Weighted Stretch  2 pounds;60 seconds      Wrist Exercises    Wrist Flexion  PROM;AROM;10 reps    Wrist Extension  PROM;AROM;10 reps    Wrist Radial Deviation  PROM;10 reps    Wrist Ulnar Deviation  PROM;10 reps      Additional Wrist Exercises   Sponges  Pt used green clothespin and 3 point pinch to grasp and place 25 high resistance sponges into bucket.       Manual Therapy   Manual Therapy  Myofascial release    Manual therapy comments  Manual therapy completed separately from therapeutic exercise    Myofascial Release  Myofascial release completed to palm at 3rd digit, volar and dorsal wrist along with scar massage to decrease fascial restrictions and pain and increase joint range of motion in left wrist               OT Short Term Goals - 10/11/17 1505      OT SHORT TERM GOAL #1   Title  Pt will be provided with and educated on HEP to improve wrist mobility required for functional task completion.     Time  4    Period  Weeks    Status  On-going      OT SHORT TERM GOAL #2   Title  Pt will decrease pain in left wrist to 2/10 or less during daily tasks to improve use of left wrist as non-dominant during ADLs.     Time  4    Period  Weeks    Status  On-going      OT SHORT TERM GOAL #3   Title  Pt will improve left wrist ROM to [facility] to improve ability to perform grooming tasks.     Time  4    Period  Weeks    Status  On-going      OT SHORT TERM GOAL #4   Title  Pt will improve left wrist strength to 4/5 or better to improve ability to perform quilting tasks.     Time  4    Period  Weeks    Status  On-going  OT SHORT TERM GOAL #5   Title  Pt will improve left grip strength by 20# and pinch strength by 4# to improve ability to lift/move/carry pots and pans during cooking tasks.     Time  4    Period  Weeks    Status  On-going               Plan - 10/18/17 1517    Clinical Impression Statement  A: Continued with manual therapy focusing on scar tissue around incision and on decreasing palmar fascial restrictions  to decrease pain. Pt able to complete handgripper task at 7# today with large and medium beads, unable to complete with gripper at 11# due to pain along thumb. Verbal cuing during session for form and technique.     Plan  P: Continue with manual therapy to decrease fascial restrictions and add [place] if needed. Continue with grip strengthening working on completing at 11#       Patient will benefit from skilled therapeutic intervention in order to improve the following deficits and impairments:  Decreased scar mobility, Decreased activity tolerance, Decreased strength, Impaired flexibility, Pain, Increased edema, Decreased range of motion, Increased fascial restrictions, Impaired UE functional use  Visit Diagnosis: Pain in left wrist  Other symptoms and signs involving the musculoskeletal system  Stiffness of left wrist, not elsewhere classified    Problem List Patient Active Problem List   Diagnosis Date Noted  . Arthrofibrosis of knee joint 06/07/2015  . Status post right knee replacement 06/07/2015  . Primary osteoarthritis of right knee 04/05/2015  . Pain due to knee joint prosthesis ([place]) 04/05/2015  . Status post revision of total replacement of left knee 04/15/2013  . Pain due to total left knee replacement ([place]) 03/27/2013  . Muscle weakness (generalized) 07/02/2012  . Total knee replacement status 07/02/2012  . Difficulty in walking(719.7) 07/02/2012  . Stiffness of left knee 07/02/2012  . Pain in left knee 07/02/2012   Guadelupe Sabin, OTR/L  [phone] 10/18/2017, 3:20 PM  [facility] [address] [place], [place], [address] Phone: [phone]   Fax:  [phone]  Name: [name] MRN: [id number] Date of Birth: [birth year]

## 2017-10-24 ENCOUNTER — Encounter (HOSPITAL_COMMUNITY): Payer: Self-pay | Admitting: Occupational Therapy

## 2017-10-24 ENCOUNTER — Ambulatory Visit (HOSPITAL_COMMUNITY): Payer: Medicare Other | Admitting: Occupational Therapy

## 2017-10-24 DIAGNOSIS — M25632 Stiffness of left wrist, not elsewhere classified: Secondary | ICD-10-CM

## 2017-10-24 DIAGNOSIS — M25532 Pain in left wrist: Secondary | ICD-10-CM

## 2017-10-24 DIAGNOSIS — R29898 Other symptoms and signs involving the musculoskeletal system: Secondary | ICD-10-CM | POA: Diagnosis not present

## 2017-10-24 NOTE — Therapy (Signed)
[place], [place], [address] Phone: [phone]   Fax:  [phone]  Occupational Therapy Treatment  Patient Details  Name: [name] MRN: [id number] Date of Birth: Nov 10, 1955 Referring Provider: Velda Shell, Pa-C   Encounter Date: 10/24/2017  OT End of Session - 10/24/17 1559    Visit Number  5    Number of Visits  9    Date for OT Re-Evaluation  11/10/17    Authorization Type  [employer] Medicare    Authorization Time Period  visits based on medical necessity; $40 copay    OT Start Time  1518    OT Stop Time  1556    OT Time Calculation (min)  38 min    Activity Tolerance  Patient tolerated treatment well    Behavior During Therapy  [facility] for tasks assessed/performed       Past Medical History:  Diagnosis Date  . Arthritis   . Cancer [facility]) 1978    cervical cancer  . Headache(784.0)   . History of migraine    last one was on 06/02/12;takes Topamax bid and Imitrex prn  . Hyperlipidemia    takes Simvastatin nightly  . Insomnia    takes Trazodone nightly  . Joint pain   . Joint swelling   . Seasonal allergies    in spring    Past Surgical History:  Procedure Laterality Date  . ABDOMINAL HYSTERECTOMY  1978  . APPENDECTOMY  2008  . ARTROPLASTY    . bladder tacked  1999  . BREAST EXCISIONAL BIOPSY    . BREAST SURGERY  2009   cyst removed from left breast  . COLONOSCOPY    . cyst removed from right knee   1972  . KNEE CLOSED REDUCTION Right 06/07/2015   Procedure: CLOSED MANIPULATION RIGHT KNEE;  Surgeon: Dorna Leitz, MD;  Location: [place];  Service: Orthopedics;  Laterality: Right;  . left knee surgery  1973  . left knee surgery  2012  . left knee surgery  2012  . right carpal tunnel    . right knee arthroscopy  90's    x 3  . right knee partial replacement  1991  . right wrist sugery  2008  . right wrist surgery    . scar tissue removed  2008  . TOTAL KNEE ARTHROPLASTY   06/12/2012   LEFT KNEE  . TOTAL KNEE ARTHROPLASTY  06/12/2012   Procedure: TOTAL KNEE ARTHROPLASTY;  Surgeon: Ninetta Lights, MD;  Location: [place];  Service: Orthopedics;  Laterality: Left;  . TOTAL KNEE ARTHROPLASTY Right 04/05/2015  . TOTAL KNEE REVISION Left 03/26/2013   Procedure: TOTAL KNEE REVISION- left;  Surgeon: Alta Corning, MD;  Location: [place];  Service: Orthopedics;  Laterality: Left;  . TOTAL KNEE REVISION Right 04/05/2015   Procedure: TOTAL KNEE REVISION;  Surgeon: Dorna Leitz, MD;  Location: [place];  Service: Orthopedics;  Laterality: Right;    There were no vitals filed for this visit.  Subjective Assessment - 10/24/17 1518    Subjective   S: My thumb has been hurting.     Currently in Pain?  Yes    Pain Score  5     Pain Location  Wrist thumb    Pain Orientation  Left    Pain Descriptors / Indicators  Aching;Sore    Pain Type  Acute pain    Pain Radiating Towards  none    Pain Onset  In the past 7 days    Pain Frequency  Intermittent    Aggravating Factors   movement, gripping    Pain Relieving Factors  heat    Effect of Pain on Daily Activities  min/mod effect on ADLs         [facility] OT Assessment - 10/24/17 1517      Assessment   Medical Diagnosis  SLAC      Precautions   Precautions  None    Required Braces or Orthoses  Other Brace/Splint    Other Brace/Splint  wrist brace-as pt feels she needs               OT Treatments/Exercises (OP) - 10/24/17 1536      Exercises   Exercises  Wrist;Hand;Elbow      Elbow Exercises   Forearm Supination  PROM;5 reps    Forearm Pronation  PROM;5 reps      Additional Elbow Exercises   Hand Gripper with Large Beads  all beads gripper at 11#    Hand Gripper with Medium Beads  all beads gripper at 11#      Weighted Stretch Over Towel Roll   Wrist Flexion - Weighted Stretch  2 pounds;60 seconds    Wrist Extension - Weighted Stretch  2 pounds;60 seconds      Wrist Exercises   Wrist Flexion  PROM;AROM;10  reps    Wrist Extension  PROM;AROM;10 reps    Wrist Radial Deviation  PROM;10 reps    Wrist Ulnar Deviation  PROM;10 reps      Modalities   Modalities  Ultrasound      Ultrasound   Ultrasound Location  lateral border of wrist along thumb and radial styloid    Ultrasound Parameters  1.5W/cm2    Ultrasound Goals  Pain      Manual Therapy   Manual Therapy  Myofascial release    Manual therapy comments  Manual therapy completed separately from therapeutic exercise    Myofascial Release  Myofascial release completed to palm at 3rd digit, volar and dorsal wrist along with scar massage to decrease fascial restrictions and pain and increase joint range of motion in left wrist               OT Short Term Goals - 10/11/17 1505      OT SHORT TERM GOAL #1   Title  Pt will be provided with and educated on HEP to improve wrist mobility required for functional task completion.     Time  4    Period  Weeks    Status  On-going      OT SHORT TERM GOAL #2   Title  Pt will decrease pain in left wrist to 2/10 or less during daily tasks to improve use of left wrist as non-dominant during ADLs.     Time  4    Period  Weeks    Status  On-going      OT SHORT TERM GOAL #3   Title  Pt will improve left wrist ROM to [facility] to improve ability to perform grooming tasks.     Time  4    Period  Weeks    Status  On-going      OT SHORT TERM GOAL #4   Title  Pt will improve left wrist strength to 4/5 or better to improve ability to perform quilting tasks.     Time  4    Period  Weeks    Status  On-going      OT SHORT TERM GOAL #5   Title  Pt will improve left grip strength by 20# and pinch strength by 4# to improve ability to lift/move/carry pots and pans during cooking tasks.     Time  4    Period  Weeks    Status  On-going               Plan - 10/24/17 1559    Clinical Impression Statement  A: Pt reporting pain and soreness along thumb and along lateral wrist/radius regions present  since over the weekend. Continued with manual therapy to address restrictions limiting ROM. A/ROM and grip strengthening completed, pt able to use gripper at 11# today with increased time. [place] at end of session to address pain.     Plan  P: Follow up on [place] use and pain level, continue with grip and pinch strengthening, add 1# weight to wrist exercises       Patient will benefit from skilled therapeutic intervention in order to improve the following deficits and impairments:  Decreased scar mobility, Decreased activity tolerance, Decreased strength, Impaired flexibility, Pain, Increased edema, Decreased range of motion, Increased fascial restrictions, Impaired UE functional use  Visit Diagnosis: Pain in left wrist  Other symptoms and signs involving the musculoskeletal system  Stiffness of left wrist, not elsewhere classified    Problem List Patient Active Problem List   Diagnosis Date Noted  . Arthrofibrosis of knee joint 06/07/2015  . Status post right knee replacement 06/07/2015  . Primary osteoarthritis of right knee 04/05/2015  . Pain due to knee joint prosthesis ([place]) 04/05/2015  . Status post revision of total replacement of left knee 04/15/2013  . Pain due to total left knee replacement ([place]) 03/27/2013  . Muscle weakness (generalized) 07/02/2012  . Total knee replacement status 07/02/2012  . Difficulty in walking(719.7) 07/02/2012  . Stiffness of left knee 07/02/2012  . Pain in left knee 07/02/2012   Guadelupe Sabin, OTR/L  [phone] 10/24/2017, 4:02 PM  [facility] [address] [place], [place], [address] Phone: [phone]   Fax:  [phone]  Name: [name] MRN: [id number] Date of Birth: [birth year]

## 2017-10-26 ENCOUNTER — Ambulatory Visit (HOSPITAL_COMMUNITY): Payer: Medicare Other | Admitting: Occupational Therapy

## 2017-10-26 ENCOUNTER — Encounter (HOSPITAL_COMMUNITY): Payer: Self-pay | Admitting: Occupational Therapy

## 2017-10-26 DIAGNOSIS — M25632 Stiffness of left wrist, not elsewhere classified: Secondary | ICD-10-CM

## 2017-10-26 DIAGNOSIS — R29898 Other symptoms and signs involving the musculoskeletal system: Secondary | ICD-10-CM

## 2017-10-26 DIAGNOSIS — M25532 Pain in left wrist: Secondary | ICD-10-CM | POA: Diagnosis not present

## 2017-10-26 NOTE — Therapy (Signed)
[facility] [address] [place], [place], [address] Phone: [phone]   Fax:  [phone]  Occupational Therapy Treatment  Patient Details  Name: [name] MRN: [id number] Date of Birth: 07-13-1955 Referring Provider: Velda Shell, Pa-C   Encounter Date: 10/26/2017  OT End of Session - 10/26/17 1645    Visit Number  6    Number of Visits  9    Date for OT Re-Evaluation  11/10/17    Authorization Type  [employer] Medicare    Authorization Time Period  visits based on medical necessity; $40 copay    OT Start Time  1557    OT Stop Time  1639    OT Time Calculation (min)  42 min    Activity Tolerance  Patient tolerated treatment well    Behavior During Therapy  [facility] for tasks assessed/performed       Past Medical History:  Diagnosis Date  . Arthritis   . Cancer [facility]) 1978    cervical cancer  . Headache(784.0)   . History of migraine    last one was on 06/02/12;takes Topamax bid and Imitrex prn  . Hyperlipidemia    takes Simvastatin nightly  . Insomnia    takes Trazodone nightly  . Joint pain   . Joint swelling   . Seasonal allergies    in spring    Past Surgical History:  Procedure Laterality Date  . ABDOMINAL HYSTERECTOMY  1978  . APPENDECTOMY  2008  . ARTROPLASTY    . bladder tacked  1999  . BREAST EXCISIONAL BIOPSY    . BREAST SURGERY  2009   cyst removed from left breast  . COLONOSCOPY    . cyst removed from right knee   1972  . KNEE CLOSED REDUCTION Right 06/07/2015   Procedure: CLOSED MANIPULATION RIGHT KNEE;  Surgeon: Dorna Leitz, MD;  Location: [place];  Service: Orthopedics;  Laterality: Right;  . left knee surgery  1973  . left knee surgery  2012  . left knee surgery  2012  . right carpal tunnel    . right knee arthroscopy  90's    x 3  . right knee partial replacement  1991  . right wrist sugery  2008  . right wrist surgery    . scar tissue removed  2008  . TOTAL KNEE ARTHROPLASTY   06/12/2012   LEFT KNEE  . TOTAL KNEE ARTHROPLASTY  06/12/2012   Procedure: TOTAL KNEE ARTHROPLASTY;  Surgeon: Ninetta Lights, MD;  Location: [place];  Service: Orthopedics;  Laterality: Left;  . TOTAL KNEE ARTHROPLASTY Right 04/05/2015  . TOTAL KNEE REVISION Left 03/26/2013   Procedure: TOTAL KNEE REVISION- left;  Surgeon: Alta Corning, MD;  Location: [place];  Service: Orthopedics;  Laterality: Left;  . TOTAL KNEE REVISION Right 04/05/2015   Procedure: TOTAL KNEE REVISION;  Surgeon: Dorna Leitz, MD;  Location: [place];  Service: Orthopedics;  Laterality: Right;    There were no vitals filed for this visit.  Subjective Assessment - 10/26/17 1613    Subjective   S: It feels like a boil sometimes.     Currently in Pain?  Yes    Pain Score  5     Pain Location  Wrist    Pain Orientation  Left    Pain Descriptors / Indicators  Aching;Sore    Pain Type  Acute pain    Pain Radiating Towards  none    Pain Onset  1 to 4 weeks ago    Pain Frequency  Intermittent    Aggravating Factors   movement, gripping    Pain Relieving Factors  heat    Effect of Pain on Daily Activities  min/mod effect on ADLs         [facility] OT Assessment - 10/26/17 1613      Assessment   Medical Diagnosis  SLAC      Precautions   Precautions  None    Required Braces or Orthoses  Other Brace/Splint    Other Brace/Splint  wrist brace-as pt feels she needs               OT Treatments/Exercises (OP) - 10/26/17 1614      Exercises   Exercises  Wrist;Hand;Elbow;Theraputty      Elbow Exercises   Forearm Supination  PROM;5 reps    Forearm Pronation  PROM;5 reps      Additional Elbow Exercises   Theraputty - Flatten  yellow      Weighted Stretch Over Towel Roll   Wrist Flexion - Weighted Stretch  2 pounds;60 seconds    Wrist Extension - Weighted Stretch  2 pounds;60 seconds      Wrist Exercises   Wrist Flexion  PROM;5 reps;Strengthening;10 reps    Bar Weights/Barbell (Wrist Flexion)  1 lb    Wrist  Extension  PROM;5 reps;Strengthening;10 reps    Bar Weights/Barbell (Wrist Extension)  1 lb    Wrist Radial Deviation  PROM;5 reps;Strengthening;10 reps    Bar Weights/Barbell (Radial Deviation)  1 lb    Wrist Ulnar Deviation  PROM;5 reps;Strengthening;10 reps    Bar Weights/Barbell (Ulnar Deviation)  1 lb      Modalities   Modalities  Ultrasound      Ultrasound   Ultrasound Location  medial border of wrist/ulnar styloid area    Ultrasound Parameters  1.5W/cm2    Ultrasound Goals  Pain      Manual Therapy   Manual Therapy  Myofascial release    Manual therapy comments  Manual therapy completed separately from therapeutic exercise    Myofascial Release  Myofascial release completed to palm at 3rd digit, volar and dorsal wrist along with scar massage to decrease fascial restrictions and pain and increase joint range of motion in left wrist             OT Education - 10/26/17 1645    Education provided  Yes    Education Details  finger A/ROM    Person(s) Educated  Patient    Methods  Explanation;Demonstration;Handout    Comprehension  Verbalized understanding;Returned demonstration       OT Short Term Goals - 10/11/17 1505      OT SHORT TERM GOAL #1   Title  Pt will be provided with and educated on HEP to improve wrist mobility required for functional task completion.     Time  4    Period  Weeks    Status  On-going      OT SHORT TERM GOAL #2   Title  Pt will decrease pain in left wrist to 2/10 or less during daily tasks to improve use of left wrist as non-dominant during ADLs.     Time  4    Period  Weeks    Status  On-going      OT SHORT TERM GOAL #3   Title  Pt will improve left wrist ROM to [facility] to improve ability to perform grooming tasks.  Time  4    Period  Weeks    Status  On-going      OT SHORT TERM GOAL #4   Title  Pt will improve left wrist strength to 4/5 or better to improve ability to perform quilting tasks.     Time  4    Period  Weeks     Status  On-going      OT SHORT TERM GOAL #5   Title  Pt will improve left grip strength by 20# and pinch strength by 4# to improve ability to lift/move/carry pots and pans during cooking tasks.     Time  4    Period  Weeks    Status  On-going               Plan - 10/26/17 1645    Clinical Impression Statement  A: Pt reporting decreased pain along lateral wrist, main complaint is pain at medial wrist near ulnar styloid-slight swelling noted. Continued manual therapy to address restrictions today, initiated wrist strengthening and added theraputty flattening activity for improved wrist extension and weight bearing abilities. [place] at end of session for pain management. Verbal cuing throughout session for form and technique.     Plan  P: Follow up on [place] use and pain level, increase weight for wrist stretch in flexion and continue with strengthening.        Patient will benefit from skilled therapeutic intervention in order to improve the following deficits and impairments:  Decreased scar mobility, Decreased activity tolerance, Decreased strength, Impaired flexibility, Pain, Increased edema, Decreased range of motion, Increased fascial restrictions, Impaired UE functional use  Visit Diagnosis: Pain in left wrist  Other symptoms and signs involving the musculoskeletal system  Stiffness of left wrist, not elsewhere classified    Problem List Patient Active Problem List   Diagnosis Date Noted  . Arthrofibrosis of knee joint 06/07/2015  . Status post right knee replacement 06/07/2015  . Primary osteoarthritis of right knee 04/05/2015  . Pain due to knee joint prosthesis ([place]) 04/05/2015  . Status post revision of total replacement of left knee 04/15/2013  . Pain due to total left knee replacement ([place]) 03/27/2013  . Muscle weakness (generalized) 07/02/2012  . Total knee replacement status 07/02/2012  . Difficulty in walking(719.7) 07/02/2012  . Stiffness of left knee 07/02/2012   . Pain in left knee 07/02/2012   Guadelupe Sabin, OTR/L  [phone] 10/26/2017, 4:48 PM  [facility] [address] [place], [place], [address] Phone: [phone]   Fax:  [phone]  Name: [name] MRN: [id number] Date of Birth: 1955/12/24

## 2017-10-26 NOTE — Patient Instructions (Signed)
1) Towel crunch Place a small towel on a firm table top. Flatten out the towel and then place your hand on one end of it.  Next, flex your fingers 2-5 (index finger through pinky finger) as you pull the towel towards your hand.    2) Digit composite flexion/adduction (make a fist) Hold your hand up as shown. Open and close your hand into a fist and repeat. If you cannot make a full fist, then make a partial fist.        3) Finger Taps Start with the hand flat and fingers slightly spread.  One at a time, starting with the thumb, lift each finger up separately.    4) Digit Abduction/Adduction Hold hand palm down flat on table. Spread your fingers apart and back together.

## 2017-10-29 ENCOUNTER — Ambulatory Visit: Payer: Medicare Other

## 2017-10-29 ENCOUNTER — Encounter (HOSPITAL_COMMUNITY): Payer: Medicare Other

## 2017-10-31 ENCOUNTER — Ambulatory Visit (HOSPITAL_COMMUNITY): Payer: Medicare Other | Attending: Physician Assistant | Admitting: Occupational Therapy

## 2017-10-31 ENCOUNTER — Encounter (HOSPITAL_COMMUNITY): Payer: Self-pay | Admitting: Occupational Therapy

## 2017-10-31 DIAGNOSIS — M25632 Stiffness of left wrist, not elsewhere classified: Secondary | ICD-10-CM

## 2017-10-31 DIAGNOSIS — R29898 Other symptoms and signs involving the musculoskeletal system: Secondary | ICD-10-CM | POA: Insufficient documentation

## 2017-10-31 DIAGNOSIS — M25532 Pain in left wrist: Secondary | ICD-10-CM | POA: Diagnosis present

## 2017-10-31 NOTE — Therapy (Signed)
[place], [place], [address] Phone: [phone]   Fax:  [phone]  Occupational Therapy Treatment  Patient Details  Name: [name] MRN: [id number] Date of Birth: Aug 21, 1955 Referring Provider: Velda Shell, Pa-C   Encounter Date: 10/31/2017  OT End of Session - 10/31/17 1523    Visit Number  7    Number of Visits  9    Date for OT Re-Evaluation  11/10/17    Authorization Type  BC[employer] Medicare    Authorization Time Period  visits based on medical necessity; $40 copay    OT Start Time  1432    OT Stop Time  1513    OT Time Calculation (min)  41 min    Activity Tolerance  Patient tolerated treatment well    Behavior During Therapy  [facility] for tasks assessed/performed       Past Medical History:  Diagnosis Date  . Arthritis   . Cancer [facility]) 1978    cervical cancer  . Headache(784.0)   . History of migraine    last one was on 06/02/12;takes Topamax bid and Imitrex prn  . Hyperlipidemia    takes Simvastatin nightly  . Insomnia    takes Trazodone nightly  . Joint pain   . Joint swelling   . Seasonal allergies    in spring    Past Surgical History:  Procedure Laterality Date  . ABDOMINAL HYSTERECTOMY  1978  . APPENDECTOMY  2008  . ARTROPLASTY    . bladder tacked  1999  . BREAST EXCISIONAL BIOPSY    . BREAST SURGERY  2009   cyst removed from left breast  . COLONOSCOPY    . cyst removed from right knee   1972  . KNEE CLOSED REDUCTION Right 06/07/2015   Procedure: CLOSED MANIPULATION RIGHT KNEE;  Surgeon: Dorna Leitz, MD;  Location: [place];  Service: Orthopedics;  Laterality: Right;  . left knee surgery  1973  . left knee surgery  2012  . left knee surgery  2012  . right carpal tunnel    . right knee arthroscopy  90's    x 3  . right knee partial replacement  1991  . right wrist sugery  2008  . right wrist surgery    . scar tissue removed  2008  . TOTAL KNEE ARTHROPLASTY   06/12/2012   LEFT KNEE  . TOTAL KNEE ARTHROPLASTY  06/12/2012   Procedure: TOTAL KNEE ARTHROPLASTY;  Surgeon: Ninetta Lights, MD;  Location: [place];  Service: Orthopedics;  Laterality: Left;  . TOTAL KNEE ARTHROPLASTY Right 04/05/2015  . TOTAL KNEE REVISION Left 03/26/2013   Procedure: TOTAL KNEE REVISION- left;  Surgeon: Alta Corning, MD;  Location: [place];  Service: Orthopedics;  Laterality: Left;  . TOTAL KNEE REVISION Right 04/05/2015   Procedure: TOTAL KNEE REVISION;  Surgeon: Dorna Leitz, MD;  Location: [place];  Service: Orthopedics;  Laterality: Right;    There were no vitals filed for this visit.  Subjective Assessment - 10/31/17 1433    Subjective   S: I've been trying to cook some.     Currently in Pain?  Yes    Pain Score  3     Pain Location  Wrist    Pain Orientation  Left    Pain Descriptors / Indicators  Aching;Sore    Pain Type  Acute pain    Pain Radiating Towards  none    Pain Onset  1 to 4 weeks ago    Pain Frequency  Intermittent    Aggravating Factors   lifting    Pain Relieving Factors  heat    Effect of Pain on Daily Activities  min effect on ADLs    Multiple Pain Sites  No         [facility] OT Assessment - 10/31/17 1433      Assessment   Medical Diagnosis  SLAC      Precautions   Precautions  None    Required Braces or Orthoses  Other Brace/Splint    Other Brace/Splint  wrist brace-as pt feels she needs               OT Treatments/Exercises (OP) - 10/31/17 1434      Exercises   Exercises  Wrist;Hand;Elbow;Theraputty      Additional Elbow Exercises   Hand Gripper with Large Beads  all beads gripper at 20#    Hand Gripper with Medium Beads  all beads gripper at 20#      Weighted Stretch Over Towel Roll   Wrist Flexion - Weighted Stretch  3 pounds;60 seconds    Wrist Extension - Weighted Stretch  3 pounds;60 seconds      Wrist Exercises   Wrist Flexion  PROM;5 reps;Strengthening;10 reps    Bar Weights/Barbell (Wrist Flexion)  1 lb     Wrist Extension  PROM;5 reps;Strengthening;10 reps    Bar Weights/Barbell (Wrist Extension)  1 lb    Wrist Radial Deviation  PROM;5 reps;Strengthening;10 reps    Bar Weights/Barbell (Radial Deviation)  1 lb    Wrist Ulnar Deviation  PROM;5 reps;Strengthening;10 reps    Bar Weights/Barbell (Ulnar Deviation)  1 lb    Other wrist exercises  velcro board: rolling forwards and backwards working on wrist flexion and extension, 3x each direction      Additional Wrist Exercises   Sponges  Pt used blue and green clothespins in 3 point pinch to stack 3 stacks of 5 sponges, min/mod difficulty      Manual Therapy   Manual Therapy  Myofascial release    Manual therapy comments  Manual therapy completed separately from therapeutic exercise    Myofascial Release  Myofascial release completed to palm at 3rd digit, volar and dorsal wrist along with scar massage to decrease fascial restrictions and pain and increase joint range of motion in left wrist               OT Short Term Goals - 10/11/17 1505      OT SHORT TERM GOAL #1   Title  Pt will be provided with and educated on HEP to improve wrist mobility required for functional task completion.     Time  4    Period  Weeks    Status  On-going      OT SHORT TERM GOAL #2   Title  Pt will decrease pain in left wrist to 2/10 or less during daily tasks to improve use of left wrist as non-dominant during ADLs.     Time  4    Period  Weeks    Status  On-going      OT SHORT TERM GOAL #3   Title  Pt will improve left wrist ROM to [facility] to improve ability to perform grooming tasks.     Time  4    Period  Weeks    Status  On-going      OT SHORT TERM GOAL #4  Title  Pt will improve left wrist strength to 4/5 or better to improve ability to perform quilting tasks.     Time  4    Period  Weeks    Status  On-going      OT SHORT TERM GOAL #5   Title  Pt will improve left grip strength by 20# and pinch strength by 4# to improve ability to  lift/move/carry pots and pans during cooking tasks.     Time  4    Period  Weeks    Status  On-going               Plan - 10/31/17 1523    Clinical Impression Statement  A: Increased hand gripper to 20#, increased weighted wrist stretches to 3#, also added velcro board exercises working to improve wrist flexion/extension. Pt continues to be most limited with wrist extension. Verbal cuing for form and technique, no [place] needed today.     Plan  P: Reassessment and determine if can discharge with HEP       Patient will benefit from skilled therapeutic intervention in order to improve the following deficits and impairments:  Decreased scar mobility, Decreased activity tolerance, Decreased strength, Impaired flexibility, Pain, Increased edema, Decreased range of motion, Increased fascial restrictions, Impaired UE functional use  Visit Diagnosis: Pain in left wrist  Other symptoms and signs involving the musculoskeletal system  Stiffness of left wrist, not elsewhere classified    Problem List Patient Active Problem List   Diagnosis Date Noted  . Arthrofibrosis of knee joint 06/07/2015  . Status post right knee replacement 06/07/2015  . Primary osteoarthritis of right knee 04/05/2015  . Pain due to knee joint prosthesis ([place]) 04/05/2015  . Status post revision of total replacement of left knee 04/15/2013  . Pain due to total left knee replacement ([place]) 03/27/2013  . Muscle weakness (generalized) 07/02/2012  . Total knee replacement status 07/02/2012  . Difficulty in walking(719.7) 07/02/2012  . Stiffness of left knee 07/02/2012  . Pain in left knee 07/02/2012   Guadelupe Sabin, OTR/L  [phone] 10/31/2017, 3:27 PM  [place] [address] [place], [place], [address] Phone: [phone]   Fax:  [phone]  Name: JAMACIA JESTER MRN: [id number] Date of Birth: 05/24/1956

## 2017-11-01 ENCOUNTER — Ambulatory Visit (HOSPITAL_COMMUNITY): Payer: Medicare Other | Admitting: Occupational Therapy

## 2017-11-01 ENCOUNTER — Encounter (HOSPITAL_COMMUNITY): Payer: Self-pay | Admitting: Occupational Therapy

## 2017-11-01 DIAGNOSIS — M25632 Stiffness of left wrist, not elsewhere classified: Secondary | ICD-10-CM

## 2017-11-01 DIAGNOSIS — M25532 Pain in left wrist: Secondary | ICD-10-CM | POA: Diagnosis not present

## 2017-11-01 DIAGNOSIS — R29898 Other symptoms and signs involving the musculoskeletal system: Secondary | ICD-10-CM

## 2017-11-01 NOTE — Therapy (Signed)
[place], [place], [address] Phone: [phone]   Fax:  [phone]  Occupational Therapy Reassessment, Treatment (recertification)  Patient Details  Name: [name] MRN: [id number] Date of Birth: 09/20/1955 Referring Provider: Velda Shell, Pa-C   Encounter Date: 11/01/2017   Progress Note Reporting Period 10/10/2017 to 11/01/2017  See note below for Objective Data and Assessment of Progress/Goals.      OT End of Session - 11/01/17 1523    Visit Number  8    Number of Visits  10    Date for OT Re-Evaluation  11/23/17    Authorization Type  [employer] Medicare    Authorization Time Period  visits based on medical necessity; $40 copay    OT Start Time  1430    OT Stop Time  1515    OT Time Calculation (min)  45 min    Activity Tolerance  Patient tolerated treatment well    Behavior During Therapy  WFL for tasks assessed/performed       Past Medical History:  Diagnosis Date  . Arthritis   . Cancer [facility]) 1978    cervical cancer  . Headache(784.0)   . History of migraine    last one was on 06/02/12;takes Topamax bid and Imitrex prn  . Hyperlipidemia    takes Simvastatin nightly  . Insomnia    takes Trazodone nightly  . Joint pain   . Joint swelling   . Seasonal allergies    in spring    Past Surgical History:  Procedure Laterality Date  . ABDOMINAL HYSTERECTOMY  1978  . APPENDECTOMY  2008  . ARTROPLASTY    . bladder tacked  1999  . BREAST EXCISIONAL BIOPSY    . BREAST SURGERY  2009   cyst removed from left breast  . COLONOSCOPY    . cyst removed from right knee   1972  . KNEE CLOSED REDUCTION Right 06/07/2015   Procedure: CLOSED MANIPULATION RIGHT KNEE;  Surgeon: Dorna Leitz, MD;  Location: [place];  Service: Orthopedics;  Laterality: Right;  . left knee surgery  1973  . left knee surgery  2012  . left knee surgery  2012  . right carpal tunnel    . right knee  arthroscopy  90's    x 3  . right knee partial replacement  1991  . right wrist sugery  2008  . right wrist surgery    . scar tissue removed  2008  . TOTAL KNEE ARTHROPLASTY  06/12/2012   LEFT KNEE  . TOTAL KNEE ARTHROPLASTY  06/12/2012   Procedure: TOTAL KNEE ARTHROPLASTY;  Surgeon: Ninetta Lights, MD;  Location: [place];  Service: Orthopedics;  Laterality: Left;  . TOTAL KNEE ARTHROPLASTY Right 04/05/2015  . TOTAL KNEE REVISION Left 03/26/2013   Procedure: TOTAL KNEE REVISION- left;  Surgeon: Alta Corning, MD;  Location: [place];  Service: Orthopedics;  Laterality: Left;  . TOTAL KNEE REVISION Right 04/05/2015   Procedure: TOTAL KNEE REVISION;  Surgeon: Dorna Leitz, MD;  Location: [place];  Service: Orthopedics;  Laterality: Right;    There were no vitals filed for this visit.  Subjective Assessment - 11/01/17 1432    Subjective   S: It hurt to hold my binoculars last night.     Currently in Pain?  Yes    Pain Score  8     Pain Location  -- thumb    Pain Orientation  Left  Pain Descriptors / Indicators  Aching;Sore    Pain Type  Acute pain    Pain Radiating Towards  none    Pain Onset  1 to 4 weeks ago    Pain Frequency  Intermittent    Aggravating Factors   lifting    Pain Relieving Factors  heat    Effect of Pain on Daily Activities  min effect on ADLs    Multiple Pain Sites  No         [facility] OT Assessment - 11/01/17 1430      Assessment   Medical Diagnosis  SLAC      Precautions   Precautions  None    Required Braces or Orthoses  Other Brace/Splint    Other Brace/Splint  wrist brace-as pt feels she needs      AROM   Right/Left Forearm  Left    Left Forearm Pronation  90 Degrees same as previous    Left Forearm Supination  90 Degrees 80 previous    Right/Left Wrist  Left    Left Wrist Extension  35 Degrees 20 previous    Left Wrist Flexion  40 Degrees 8 previous    Left Wrist Radial Deviation  8 Degrees 4 previous    Left Wrist Ulnar Deviation  25 Degrees 16  previous      PROM   Right/Left Forearm  Left    Left Forearm Pronation  90 Degrees same as previous    Left Forearm Supination  90 Degrees same as previous    Left Wrist Extension  42 Degrees 30 previous    Left Wrist Flexion  45 Degrees 16 previous    Left Wrist Radial Deviation  10 Degrees 8 previous    Left Wrist Ulnar Deviation  25 Degrees same as previous      Strength   Strength Assessment Site  Wrist    Right/Left Wrist  Left    Left Wrist Flexion  3+/5 not previously assessed    Left Wrist Extension  4/5 not previously assessed    Left Wrist Radial Deviation  4+/5 not previously assessed    Left Wrist Ulnar Deviation  5/5 not previously assessed    Left Hand Grip (lbs)  35 20 previous    Left Hand Lateral Pinch  11 lbs same as previous    Left Hand 3 Point Pinch  9 lbs 7 previous               OT Treatments/Exercises (OP) - 11/01/17 1434      Exercises   Exercises  Wrist;Hand;Elbow;Theraputty      Additional Elbow Exercises   Theraputty - Flatten  red      Weighted Stretch Over Towel Roll   Wrist Flexion - Weighted Stretch  3 pounds;60 seconds    Wrist Extension - Weighted Stretch  3 pounds;60 seconds      Wrist Exercises   Wrist Flexion  PROM;5 reps;Strengthening;10 reps    Bar Weights/Barbell (Wrist Flexion)  1 lb    Wrist Extension  PROM;5 reps;Strengthening;10 reps    Bar Weights/Barbell (Wrist Extension)  1 lb    Wrist Radial Deviation  PROM;5 reps;Strengthening;10 reps    Bar Weights/Barbell (Radial Deviation)  1 lb    Wrist Ulnar Deviation  PROM;5 reps;Strengthening;10 reps    Bar Weights/Barbell (Ulnar Deviation)  1 lb      Manual Therapy   Manual Therapy  Myofascial release    Manual therapy comments  Manual therapy completed separately  from therapeutic exercise    Myofascial Release  Myofascial release completed to palm at 3rd digit, volar and dorsal wrist along with scar massage to decrease fascial restrictions and pain and increase joint  range of motion in left wrist               OT Short Term Goals - 10/11/17 1505      OT SHORT TERM GOAL #1   Title  Pt will be provided with and educated on HEP to improve wrist mobility required for functional task completion.     Time  4    Period  Weeks    Status  On-going      OT SHORT TERM GOAL #2   Title  Pt will decrease pain in left wrist to 2/10 or less during daily tasks to improve use of left wrist as non-dominant during ADLs.     Time  4    Period  Weeks    Status  On-going      OT SHORT TERM GOAL #3   Title  Pt will improve left wrist ROM to [facility] to improve ability to perform grooming tasks.     Time  4    Period  Weeks    Status  On-going      OT SHORT TERM GOAL #4   Title  Pt will improve left wrist strength to 4/5 or better to improve ability to perform quilting tasks.     Time  4    Period  Weeks    Status  On-going      OT SHORT TERM GOAL #5   Title  Pt will improve left grip strength by 20# and pinch strength by 4# to improve ability to lift/move/carry pots and pans during cooking tasks.     Time  4    Period  Weeks    Status  On-going               Plan - 11/01/17 1524    Clinical Impression Statement  A: Reassessment completed this session, pt has made improvements in ROM, strength, grip strength, and functional use of left wrist and hand. In comparing left and right wrists, pt lacks from 5-10 degrees of motion in flexion and extension. Continued with wrist strengthening today, including weighted stretches. Pt will continued therapy services 1x/week for 2 additional weeks to prepare for transition to HEP.     Rehab Potential  Good    OT Frequency  1x / week    OT Duration  2 weeks    OT Treatment/Interventions  Self-care/ADL training;Therapeutic exercise;Ultrasound;Manual Therapy;Splinting;Therapeutic activities;Cryotherapy;Paraffin;Electrical Stimulation;Scar mobilization;Moist Heat;Passive range of motion;Patient/family education     Plan  P: Continue with passive stretching and grip strengthening    Consulted and Agree with Plan of Care  Patient       Patient will benefit from skilled therapeutic intervention in order to improve the following deficits and impairments:  Decreased scar mobility, Decreased activity tolerance, Decreased strength, Impaired flexibility, Pain, Increased edema, Decreased range of motion, Increased fascial restrictions, Impaired UE functional use  Visit Diagnosis: Pain in left wrist  Other symptoms and signs involving the musculoskeletal system  Stiffness of left wrist, not elsewhere classified    Problem List Patient Active Problem List   Diagnosis Date Noted  . Arthrofibrosis of knee joint 06/07/2015  . Status post right knee replacement 06/07/2015  . Primary osteoarthritis of right knee 04/05/2015  . Pain due to knee joint prosthesis ([place]) 04/05/2015  .  Status post revision of total replacement of left knee 04/15/2013  . Pain due to total left knee replacement ([place]) 03/27/2013  . Muscle weakness (generalized) 07/02/2012  . Total knee replacement status 07/02/2012  . Difficulty in walking(719.7) 07/02/2012  . Stiffness of left knee 07/02/2012  . Pain in left knee 07/02/2012   Guadelupe Sabin, OTR/L  [phone] 11/01/2017, 3:30 PM  [place] [address] [place], [place], [address] Phone: [phone]   Fax:  [phone]  Name: [name] MRN: [id number] Date of Birth: 15-Dec-1955

## 2017-11-07 ENCOUNTER — Encounter (HOSPITAL_COMMUNITY): Payer: Self-pay | Admitting: Occupational Therapy

## 2017-11-07 ENCOUNTER — Ambulatory Visit (HOSPITAL_COMMUNITY): Payer: Medicare Other | Admitting: Occupational Therapy

## 2017-11-07 DIAGNOSIS — M25632 Stiffness of left wrist, not elsewhere classified: Secondary | ICD-10-CM

## 2017-11-07 DIAGNOSIS — R29898 Other symptoms and signs involving the musculoskeletal system: Secondary | ICD-10-CM

## 2017-11-07 DIAGNOSIS — M25532 Pain in left wrist: Secondary | ICD-10-CM | POA: Diagnosis not present

## 2017-11-07 NOTE — Therapy (Signed)
[facility] [address] [place], [place], [address] Phone: [phone]   Fax:  [phone]  Occupational Therapy Treatment  Patient Details  Name: [name] MRN: [id number] Date of Birth: 29-Dec-1955 Referring Provider: Velda Shell, Pa-C   Encounter Date: 11/07/2017  OT End of Session - 11/07/17 1742    Visit Number  9    Number of Visits  10    Date for OT Re-Evaluation  11/23/17    Authorization Type  [employer] Medicare    Authorization Time Period  visits based on medical necessity; $40 copay    OT Start Time  1650    OT Stop Time  1740    OT Time Calculation (min)  50 min    Activity Tolerance  Patient tolerated treatment well    Behavior During Therapy  [facility] for tasks assessed/performed       Past Medical History:  Diagnosis Date  . Arthritis   . Cancer [facility]) 1978    cervical cancer  . Headache(784.0)   . History of migraine    last one was on 06/02/12;takes Topamax bid and Imitrex prn  . Hyperlipidemia    takes Simvastatin nightly  . Insomnia    takes Trazodone nightly  . Joint pain   . Joint swelling   . Seasonal allergies    in spring    Past Surgical History:  Procedure Laterality Date  . ABDOMINAL HYSTERECTOMY  1978  . APPENDECTOMY  2008  . ARTROPLASTY    . bladder tacked  1999  . BREAST EXCISIONAL BIOPSY    . BREAST SURGERY  2009   cyst removed from left breast  . COLONOSCOPY    . cyst removed from right knee   1972  . KNEE CLOSED REDUCTION Right 06/07/2015   Procedure: CLOSED MANIPULATION RIGHT KNEE;  Surgeon: Dorna Leitz, MD;  Location: [facility];  Service: Orthopedics;  Laterality: Right;  . left knee surgery  1973  . left knee surgery  2012  . left knee surgery  2012  . right carpal tunnel    . right knee arthroscopy  90's    x 3  . right knee partial replacement  1991  . right wrist sugery  2008  . right wrist surgery    . scar tissue removed  2008  . TOTAL KNEE ARTHROPLASTY   06/12/2012   LEFT KNEE  . TOTAL KNEE ARTHROPLASTY  06/12/2012   Procedure: TOTAL KNEE ARTHROPLASTY;  Surgeon: Ninetta Lights, MD;  Location: [facility];  Service: Orthopedics;  Laterality: Left;  . TOTAL KNEE ARTHROPLASTY Right 04/05/2015  . TOTAL KNEE REVISION Left 03/26/2013   Procedure: TOTAL KNEE REVISION- left;  Surgeon: Alta Corning, MD;  Location: [place];  Service: Orthopedics;  Laterality: Left;  . TOTAL KNEE REVISION Right 04/05/2015   Procedure: TOTAL KNEE REVISION;  Surgeon: Dorna Leitz, MD;  Location: [place];  Service: Orthopedics;  Laterality: Right;    There were no vitals filed for this visit.  Subjective Assessment - 11/07/17 1652    Subjective   S: I swept and it did pretty well.     Currently in Pain?  Yes    Pain Score  2     Pain Location  -- palm    Pain Orientation  Left    Pain Descriptors / Indicators  Sore    Pain Type  Acute pain    Pain Radiating Towards  none    Pain  Onset  More than a month ago    Pain Frequency  Intermittent    Aggravating Factors   lifting, use    Pain Relieving Factors  heat    Effect of Pain on Daily Activities  min effect on ADLs    Multiple Pain Sites  No         [facility] OT Assessment - 11/07/17 1652      Assessment   Medical Diagnosis  SLAC      Precautions   Precautions  None    Required Braces or Orthoses  Other Brace/Splint    Other Brace/Splint  wrist brace-as pt feels she needs               OT Treatments/Exercises (OP) - 11/07/17 1652      Exercises   Exercises  Wrist;Hand;Elbow;Theraputty      Additional Elbow Exercises   Hand Gripper with Large Beads  all beads gripper at 22#    Hand Gripper with Medium Beads  all beads gripper at 22#      Weighted Stretch Over Towel Roll   Wrist Flexion - Weighted Stretch  3 pounds;60 seconds    Wrist Extension - Weighted Stretch  3 pounds;60 seconds      Wrist Exercises   Wrist Flexion  PROM;5 reps;Strengthening;10 reps    Bar Weights/Barbell (Wrist Flexion)  2  lbs    Wrist Extension  PROM;5 reps;Strengthening;10 reps    Bar Weights/Barbell (Wrist Extension)  2 lbs    Wrist Radial Deviation  PROM;5 reps;Strengthening;10 reps    Bar Weights/Barbell (Radial Deviation)  2 lbs    Wrist Ulnar Deviation  PROM;5 reps;Strengthening;10 reps    Bar Weights/Barbell (Ulnar Deviation)  2 lbs    Other wrist exercises  velcro board: rolling forwards and backwards working on wrist flexion and extension, 3x each direction      Modalities   Modalities  Ultrasound      Ultrasound   Ultrasound Location  tendons along palm at 3rd and 4th digits    Ultrasound Parameters  1.5W/cm2    Ultrasound Goals  Pain      Manual Therapy   Manual Therapy  Myofascial release    Manual therapy comments  Manual therapy completed separately from therapeutic exercise    Myofascial Release  Myofascial release completed to palm at 3rd digit, volar and dorsal wrist along with scar massage to decrease fascial restrictions and pain and increase joint range of motion in left wrist               OT Short Term Goals - 10/11/17 1505      OT SHORT TERM GOAL #1   Title  Pt will be provided with and educated on HEP to improve wrist mobility required for functional task completion.     Time  4    Period  Weeks    Status  On-going      OT SHORT TERM GOAL #2   Title  Pt will decrease pain in left wrist to 2/10 or less during daily tasks to improve use of left wrist as non-dominant during ADLs.     Time  4    Period  Weeks    Status  On-going      OT SHORT TERM GOAL #3   Title  Pt will improve left wrist ROM to [facility] to improve ability to perform grooming tasks.     Time  4    Period  Weeks  Status  On-going      OT SHORT TERM GOAL #4   Title  Pt will improve left wrist strength to 4/5 or better to improve ability to perform quilting tasks.     Time  4    Period  Weeks    Status  On-going      OT SHORT TERM GOAL #5   Title  Pt will improve left grip strength by 20# and  pinch strength by 4# to improve ability to lift/move/carry pots and pans during cooking tasks.     Time  4    Period  Weeks    Status  On-going               Plan - 11/07/17 1743    Clinical Impression Statement  A: Continued working on tightened tendons along palm at 3rd and 4th digits. Pt reports she was able to sweep this week without difficulty, continues to have difficulty with lifting heavy items. Holding the steering wheel bothers her. Continued with ROM exercises and grip strengthening-gripper held in horizontal position due to pain along thumb in vertical position. Verbal cuing for form and technique.     Plan  P: Continue with passive stretching and grip strengthening, add pinch strengthening task.    Consulted and Agree with Plan of Care  Patient       Patient will benefit from skilled therapeutic intervention in order to improve the following deficits and impairments:  Decreased scar mobility, Decreased activity tolerance, Decreased strength, Impaired flexibility, Pain, Increased edema, Decreased range of motion, Increased fascial restrictions, Impaired UE functional use  Visit Diagnosis: Pain in left wrist  Other symptoms and signs involving the musculoskeletal system  Stiffness of left wrist, not elsewhere classified    Problem List Patient Active Problem List   Diagnosis Date Noted  . Arthrofibrosis of knee joint 06/07/2015  . Status post right knee replacement 06/07/2015  . Primary osteoarthritis of right knee 04/05/2015  . Pain due to knee joint prosthesis ([place]) 04/05/2015  . Status post revision of total replacement of left knee 04/15/2013  . Pain due to total left knee replacement ([place]) 03/27/2013  . Muscle weakness (generalized) 07/02/2012  . Total knee replacement status 07/02/2012  . Difficulty in walking(719.7) 07/02/2012  . Stiffness of left knee 07/02/2012  . Pain in left knee 07/02/2012   Guadelupe Sabin, OTR/L  [phone] 11/07/2017, 5:47  PM  [facility] [place], [place], [address] Phone: [phone]   Fax:  [phone]  Name: [name] MRN: [id number] Date of Birth: 1956-04-14

## 2017-11-14 ENCOUNTER — Encounter (HOSPITAL_COMMUNITY): Payer: Self-pay | Admitting: Occupational Therapy

## 2017-11-14 ENCOUNTER — Ambulatory Visit (HOSPITAL_COMMUNITY): Payer: Medicare Other | Admitting: Occupational Therapy

## 2017-11-14 DIAGNOSIS — M25632 Stiffness of left wrist, not elsewhere classified: Secondary | ICD-10-CM

## 2017-11-14 DIAGNOSIS — M25532 Pain in left wrist: Secondary | ICD-10-CM | POA: Diagnosis not present

## 2017-11-14 DIAGNOSIS — R29898 Other symptoms and signs involving the musculoskeletal system: Secondary | ICD-10-CM

## 2017-11-14 NOTE — Therapy (Signed)
[facility] [place], [place], [address] Phone: [phone]   Fax:  [phone]  Occupational Therapy Reassessment, Treatment, and Discharge  Patient Details  Name: [name] MRN: [id number] Date of Birth: 22-Jun-1956 Referring Provider: Velda Shell, Pa-C  Progress Note Reporting Period 11/01/2017 to 11/14/2017  See note below for Objective Data and Assessment of Progress/Goals.       Encounter Date: 11/14/2017  OT End of Session - 11/14/17 1600    Visit Number  10    Number of Visits  10    Date for OT Re-Evaluation  11/23/17    Authorization Type  [employer] Medicare    Authorization Time Period  visits based on medical necessity; $40 copay    OT Start Time  1516    OT Stop Time  1558    OT Time Calculation (min)  42 min    Activity Tolerance  Patient tolerated treatment well    Behavior During Therapy  WFL for tasks assessed/performed       Past Medical History:  Diagnosis Date  . Arthritis   . Cancer [facility]) 1978    cervical cancer  . Headache(784.0)   . History of migraine    last one was on 06/02/12;takes Topamax bid and Imitrex prn  . Hyperlipidemia    takes Simvastatin nightly  . Insomnia    takes Trazodone nightly  . Joint pain   . Joint swelling   . Seasonal allergies    in spring    Past Surgical History:  Procedure Laterality Date  . ABDOMINAL HYSTERECTOMY  1978  . APPENDECTOMY  2008  . ARTROPLASTY    . bladder tacked  1999  . BREAST EXCISIONAL BIOPSY    . BREAST SURGERY  2009   cyst removed from left breast  . COLONOSCOPY    . cyst removed from right knee   1972  . KNEE CLOSED REDUCTION Right 06/07/2015   Procedure: CLOSED MANIPULATION RIGHT KNEE;  Surgeon: Dorna Leitz, MD;  Location: [facility];  Service: Orthopedics;  Laterality: Right;  . left knee surgery  1973  . left knee surgery  2012  . left knee surgery  2012  . right carpal tunnel    . right knee arthroscopy   90's    x 3  . right knee partial replacement  1991  . right wrist sugery  2008  . right wrist surgery    . scar tissue removed  2008  . TOTAL KNEE ARTHROPLASTY  06/12/2012   LEFT KNEE  . TOTAL KNEE ARTHROPLASTY  06/12/2012   Procedure: TOTAL KNEE ARTHROPLASTY;  Surgeon: Ninetta Lights, MD;  Location: [facility];  Service: Orthopedics;  Laterality: Left;  . TOTAL KNEE ARTHROPLASTY Right 04/05/2015  . TOTAL KNEE REVISION Left 03/26/2013   Procedure: TOTAL KNEE REVISION- left;  Surgeon: Alta Corning, MD;  Location: [place];  Service: Orthopedics;  Laterality: Left;  . TOTAL KNEE REVISION Right 04/05/2015   Procedure: TOTAL KNEE REVISION;  Surgeon: Dorna Leitz, MD;  Location: [facility];  Service: Orthopedics;  Laterality: Right;    There were no vitals filed for this visit.  Subjective Assessment - 11/14/17 1513    Subjective   S: It still hurts to push a buggy sometimes.     Currently in Pain?  No/denies         [facility] OT Assessment - 11/14/17 1513      Assessment   Medical Diagnosis  SLAC      Precautions   Precautions  None    Required Braces or Orthoses  Other Brace/Splint    Other Brace/Splint  wrist brace-as pt feels she needs      AROM   Right/Left Wrist  Left    Left Wrist Extension  40 Degrees 35 previous    Left Wrist Flexion  42 Degrees 40 previous    Left Wrist Radial Deviation  10 Degrees 8 previous    Left Wrist Ulnar Deviation  25 Degrees same as previous      PROM   Right/Left Wrist  Left    Left Wrist Extension  50 Degrees 42 previous    Left Wrist Flexion  48 Degrees 45 previous    Left Wrist Radial Deviation  15 Degrees 10 previous    Left Wrist Ulnar Deviation  25 Degrees same as previous      Strength   Strength Assessment Site  Wrist    Right/Left Wrist  Left    Left Wrist Flexion  4/5 3+/5 previous    Left Wrist Extension  4+/5 4/5 previous    Left Wrist Radial Deviation  5/5 4+/5 previous    Left Wrist Ulnar Deviation  5/5 same as previou    Left Hand  Grip (lbs)  32 35 previous    Left Hand Lateral Pinch  13 lbs 11 previous    Left Hand 3 Point Pinch  10 lbs 9 previous               OT Treatments/Exercises (OP) - 11/14/17 1514      Exercises   Exercises  Wrist;Hand;Elbow;Theraputty      Additional Elbow Exercises   Theraputty - Flatten  red      Weighted Stretch Over Towel Roll   Wrist Flexion - Weighted Stretch  60 seconds 4#    Wrist Extension - Weighted Stretch  60 seconds 4#      Wrist Exercises   Wrist Flexion  PROM;5 reps;Strengthening;10 reps    Bar Weights/Barbell (Wrist Flexion)  3 lbs    Wrist Extension  PROM;5 reps;Strengthening;10 reps    Bar Weights/Barbell (Wrist Extension)  3 lbs    Wrist Radial Deviation  PROM;5 reps;Strengthening;10 reps    Bar Weights/Barbell (Radial Deviation)  3 lbs    Wrist Ulnar Deviation  PROM;5 reps;Strengthening;10 reps    Bar Weights/Barbell (Ulnar Deviation)  3 lbs    Other wrist exercises  Pt used pvc pipe to cut circles into red putty working on wrist ROM and strengthening as well as grip strengthening.       Manual Therapy   Manual Therapy  Myofascial release    Manual therapy comments  Manual therapy completed separately from therapeutic exercise    Myofascial Release  Myofascial release completed to palm at 3rd digit, volar and dorsal wrist along with scar massage to decrease fascial restrictions and pain and increase joint range of motion in left wrist               OT Short Term Goals - 11/14/17 1600      OT SHORT TERM GOAL #1   Title  Pt will be provided with and educated on HEP to improve wrist mobility required for functional task completion.     Time  4    Period  Weeks    Status  Achieved      OT SHORT TERM GOAL #2   Title  Pt will decrease pain in  left wrist to 2/10 or less during daily tasks to improve use of left wrist as non-dominant during ADLs.     Time  4    Period  Weeks    Status  Achieved      OT SHORT TERM GOAL #3   Title  Pt will  improve left wrist ROM to [facility] to improve ability to perform grooming tasks.     Time  4    Period  Weeks    Status  Partially Met      OT SHORT TERM GOAL #4   Title  Pt will improve left wrist strength to 4/5 or better to improve ability to perform quilting tasks.     Time  4    Period  Weeks    Status  Achieved      OT SHORT TERM GOAL #5   Title  Pt will improve left grip strength by 20# and pinch strength by 4# to improve ability to lift/move/carry pots and pans during cooking tasks.     Time  4    Period  Weeks    Status  Partially Met               Plan - 11/14/17 1601    Clinical Impression Statement  A: Reassessment completed this session, pt has met 3/5 goals and has partially met remaining 2 goals. Pt has made good improvements in ROM reaching [facility] with exception of radial deviation. Pt grip strength has actually decreased since last session due to pain from tightened tissue tendons in the palm at 3rd and 4th digits, pt will discuss with MD at follow up appt in June. Pt is completing functional tasks with minimal difficulty at this time and is agreeable to discharge with HEP.       Plan  P: Discharge pt       Patient will benefit from skilled therapeutic intervention in order to improve the following deficits and impairments:  Decreased scar mobility, Decreased activity tolerance, Decreased strength, Impaired flexibility, Pain, Increased edema, Decreased range of motion, Increased fascial restrictions, Impaired UE functional use  Visit Diagnosis: Pain in left wrist  Other symptoms and signs involving the musculoskeletal system  Stiffness of left wrist, not elsewhere classified    Problem List Patient Active Problem List   Diagnosis Date Noted  . Arthrofibrosis of knee joint 06/07/2015  . Status post right knee replacement 06/07/2015  . Primary osteoarthritis of right knee 04/05/2015  . Pain due to knee joint prosthesis ([place]) 04/05/2015  . Status post revision  of total replacement of left knee 04/15/2013  . Pain due to total left knee replacement ([place]) 03/27/2013  . Muscle weakness (generalized) 07/02/2012  . Total knee replacement status 07/02/2012  . Difficulty in walking(719.7) 07/02/2012  . Stiffness of left knee 07/02/2012  . Pain in left knee 07/02/2012   Guadelupe Sabin, OTR/L  [phone] 11/14/2017, 4:07 PM  [facility] [address] [place], [place], [address] Phone: [phone]   Fax:  [phone]  Name: DYONNA JASPERS MRN: [id number] Date of Birth: 17-Sep-1955   OCCUPATIONAL THERAPY DISCHARGE SUMMARY  Visits from Start of Care: 10  Current functional level related to goals / functional outcomes: See above. Pt is using left hand/wrist as non-dominant during ADLs with minimal difficulty. Does have difficulty with gripping items such as shopping carts, bags, etc due to discomfort in palm. (See plan section above.)   Remaining deficits: Pain in left wrist  intermittently, decreased grip strength   Education / Equipment: HEP for wrist strengthening and grip/pinch strengthening Plan: Patient agrees to discharge.  Patient goals were partially met. Patient is being discharged due to being pleased with the current functional level.  ?????

## 2017-11-15 ENCOUNTER — Ambulatory Visit
Admission: RE | Admit: 2017-11-15 | Discharge: 2017-11-15 | Disposition: A | Discharge status: Home / Self Care | Payer: Medicare Other | Source: Ambulatory Visit | Attending: Obstetrics and Gynecology | Admitting: Obstetrics and Gynecology

## 2017-11-15 DIAGNOSIS — Z1231 Encounter for screening mammogram for malignant neoplasm of breast: Secondary | ICD-10-CM

## 2017-12-14 DIAGNOSIS — M25832 Other specified joint disorders, left wrist: Secondary | ICD-10-CM | POA: Diagnosis not present

## 2017-12-24 DIAGNOSIS — H1013 Acute atopic conjunctivitis, bilateral: Secondary | ICD-10-CM | POA: Diagnosis not present

## 2018-01-08 DIAGNOSIS — H1013 Acute atopic conjunctivitis, bilateral: Secondary | ICD-10-CM | POA: Diagnosis not present

## 2018-01-08 DIAGNOSIS — H04129 Dry eye syndrome of unspecified lacrimal gland: Secondary | ICD-10-CM | POA: Diagnosis not present

## 2018-02-11 DIAGNOSIS — H1013 Acute atopic conjunctivitis, bilateral: Secondary | ICD-10-CM | POA: Diagnosis not present

## 2018-04-02 DIAGNOSIS — Z713 Dietary counseling and surveillance: Secondary | ICD-10-CM | POA: Diagnosis not present

## 2018-04-02 DIAGNOSIS — Z6829 Body mass index (BMI) 29.0-29.9, adult: Secondary | ICD-10-CM | POA: Diagnosis not present

## 2018-04-12 DIAGNOSIS — M85841 Other specified disorders of bone density and structure, right hand: Secondary | ICD-10-CM | POA: Diagnosis not present

## 2018-04-12 DIAGNOSIS — M25531 Pain in right wrist: Secondary | ICD-10-CM | POA: Diagnosis not present

## 2018-04-12 DIAGNOSIS — M1812 Unilateral primary osteoarthritis of first carpometacarpal joint, left hand: Secondary | ICD-10-CM | POA: Diagnosis not present

## 2018-04-12 DIAGNOSIS — Z9889 Other specified postprocedural states: Secondary | ICD-10-CM | POA: Diagnosis not present

## 2018-04-12 DIAGNOSIS — M79644 Pain in right finger(s): Secondary | ICD-10-CM | POA: Diagnosis not present

## 2018-04-12 DIAGNOSIS — G8929 Other chronic pain: Secondary | ICD-10-CM | POA: Diagnosis not present

## 2018-04-16 DIAGNOSIS — G4700 Insomnia, unspecified: Secondary | ICD-10-CM | POA: Diagnosis not present

## 2018-04-16 DIAGNOSIS — Z1389 Encounter for screening for other disorder: Secondary | ICD-10-CM | POA: Diagnosis not present

## 2018-04-16 DIAGNOSIS — Z0000 Encounter for general adult medical examination without abnormal findings: Secondary | ICD-10-CM | POA: Diagnosis not present

## 2018-04-16 DIAGNOSIS — Z6828 Body mass index (BMI) 28.0-28.9, adult: Secondary | ICD-10-CM | POA: Diagnosis not present

## 2018-04-16 DIAGNOSIS — J309 Allergic rhinitis, unspecified: Secondary | ICD-10-CM | POA: Diagnosis not present

## 2018-04-16 DIAGNOSIS — E782 Mixed hyperlipidemia: Secondary | ICD-10-CM | POA: Diagnosis not present

## 2018-05-03 DIAGNOSIS — Z01419 Encounter for gynecological examination (general) (routine) without abnormal findings: Secondary | ICD-10-CM | POA: Diagnosis not present

## 2018-05-03 DIAGNOSIS — Z6827 Body mass index (BMI) 27.0-27.9, adult: Secondary | ICD-10-CM | POA: Diagnosis not present

## 2018-05-06 DIAGNOSIS — H1013 Acute atopic conjunctivitis, bilateral: Secondary | ICD-10-CM | POA: Diagnosis not present

## 2018-08-07 DIAGNOSIS — H04123 Dry eye syndrome of bilateral lacrimal glands: Secondary | ICD-10-CM | POA: Diagnosis not present

## 2018-11-08 DIAGNOSIS — M19132 Post-traumatic osteoarthritis, left wrist: Secondary | ICD-10-CM | POA: Diagnosis not present

## 2018-11-08 DIAGNOSIS — Z9889 Other specified postprocedural states: Secondary | ICD-10-CM | POA: Diagnosis not present

## 2018-11-08 DIAGNOSIS — M1812 Unilateral primary osteoarthritis of first carpometacarpal joint, left hand: Secondary | ICD-10-CM | POA: Diagnosis not present

## 2018-11-08 DIAGNOSIS — M659 Synovitis and tenosynovitis, unspecified: Secondary | ICD-10-CM | POA: Diagnosis not present

## 2018-11-08 DIAGNOSIS — M19031 Primary osteoarthritis, right wrist: Secondary | ICD-10-CM | POA: Diagnosis not present

## 2018-11-08 DIAGNOSIS — M65832 Other synovitis and tenosynovitis, left forearm: Secondary | ICD-10-CM | POA: Diagnosis not present

## 2018-11-11 DIAGNOSIS — D2122 Benign neoplasm of connective and other soft tissue of left lower limb, including hip: Secondary | ICD-10-CM | POA: Diagnosis not present

## 2018-11-18 DIAGNOSIS — Z01818 Encounter for other preprocedural examination: Secondary | ICD-10-CM | POA: Diagnosis not present

## 2018-11-18 DIAGNOSIS — Z1159 Encounter for screening for other viral diseases: Secondary | ICD-10-CM | POA: Diagnosis not present

## 2018-11-22 DIAGNOSIS — D2122 Benign neoplasm of connective and other soft tissue of left lower limb, including hip: Secondary | ICD-10-CM | POA: Diagnosis not present

## 2018-11-22 DIAGNOSIS — M67472 Ganglion, left ankle and foot: Secondary | ICD-10-CM | POA: Diagnosis not present

## 2018-11-26 DIAGNOSIS — G8929 Other chronic pain: Secondary | ICD-10-CM | POA: Diagnosis not present

## 2018-11-26 DIAGNOSIS — M175 Other unilateral secondary osteoarthritis of knee: Secondary | ICD-10-CM | POA: Diagnosis not present

## 2018-11-26 DIAGNOSIS — M25562 Pain in left knee: Secondary | ICD-10-CM | POA: Diagnosis not present

## 2018-12-03 DIAGNOSIS — Z9889 Other specified postprocedural states: Secondary | ICD-10-CM | POA: Diagnosis not present

## 2018-12-03 DIAGNOSIS — Z09 Encounter for follow-up examination after completed treatment for conditions other than malignant neoplasm: Secondary | ICD-10-CM | POA: Diagnosis not present

## 2018-12-03 DIAGNOSIS — Z86018 Personal history of other benign neoplasm: Secondary | ICD-10-CM | POA: Diagnosis not present

## 2018-12-05 ENCOUNTER — Emergency Department (HOSPITAL_COMMUNITY): Payer: Medicare Other

## 2018-12-05 ENCOUNTER — Emergency Department (HOSPITAL_COMMUNITY)
Admission: EM | Admit: 2018-12-05 | Discharge: 2018-12-05 | Disposition: A | Discharge status: Home / Self Care | Payer: Medicare Other | Attending: Emergency Medicine | Admitting: Emergency Medicine

## 2018-12-05 ENCOUNTER — Other Ambulatory Visit: Payer: Self-pay

## 2018-12-05 ENCOUNTER — Encounter (HOSPITAL_COMMUNITY): Payer: Self-pay | Admitting: Emergency Medicine

## 2018-12-05 DIAGNOSIS — Z79899 Other long term (current) drug therapy: Secondary | ICD-10-CM | POA: Diagnosis not present

## 2018-12-05 DIAGNOSIS — Z87891 Personal history of nicotine dependence: Secondary | ICD-10-CM | POA: Diagnosis not present

## 2018-12-05 DIAGNOSIS — Z23 Encounter for immunization: Secondary | ICD-10-CM | POA: Diagnosis not present

## 2018-12-05 DIAGNOSIS — S61052A Open bite of left thumb without damage to nail, initial encounter: Secondary | ICD-10-CM

## 2018-12-05 DIAGNOSIS — E785 Hyperlipidemia, unspecified: Secondary | ICD-10-CM | POA: Diagnosis not present

## 2018-12-05 DIAGNOSIS — W5501XA Bitten by cat, initial encounter: Secondary | ICD-10-CM | POA: Insufficient documentation

## 2018-12-05 DIAGNOSIS — Y998 Other external cause status: Secondary | ICD-10-CM | POA: Insufficient documentation

## 2018-12-05 DIAGNOSIS — Y9289 Other specified places as the place of occurrence of the external cause: Secondary | ICD-10-CM | POA: Diagnosis not present

## 2018-12-05 DIAGNOSIS — R9431 Abnormal electrocardiogram [ECG] [EKG]: Secondary | ICD-10-CM | POA: Diagnosis not present

## 2018-12-05 DIAGNOSIS — Y9389 Activity, other specified: Secondary | ICD-10-CM | POA: Insufficient documentation

## 2018-12-05 DIAGNOSIS — S6992XA Unspecified injury of left wrist, hand and finger(s), initial encounter: Secondary | ICD-10-CM | POA: Diagnosis not present

## 2018-12-05 MED ORDER — TETANUS-DIPHTH-ACELL PERTUSSIS 5-2.5-18.5 LF-MCG/0.5 IM SUSP
0.5000 mL | Freq: Once | INTRAMUSCULAR | Status: AC
  Administered 2018-12-05: 0.5 mL via INTRAMUSCULAR
  Filled 2018-12-05: qty 0.5

## 2018-12-05 MED ORDER — AMOXICILLIN-POT CLAVULANATE 875-125 MG PO TABS: 1.0000 | ORAL_TABLET | Freq: Two times a day (BID) | ORAL | 0 refills | Status: DC

## 2018-12-05 NOTE — ED Notes (Signed)
ED Provider at bedside. 

## 2018-12-05 NOTE — Discharge Instructions (Signed)
Please read instructions below.  Keep your wound clean and covered.  You can take over-the-counter medications as needed for pain. Take the antibiotic as prescribed until completely gone. Follow up with your primary care or urgent care for wound recheck in 3 days.  Return to the ER for fever, pus draining from wound, redness, or new or worsening symptoms.

## 2018-12-05 NOTE — ED Provider Notes (Signed)
[facility] EMERGENCY DEPARTMENT Provider Note   CSN: [id number] Arrival date & time: 12/05/18  1809    History   Chief Complaint Chief Complaint  Patient presents with  . Animal Bite    HPI [name] is a 63 y.o. female presenting to the emergency department with complaint of a cat bite that occurred to her left thumb this morning around 11 AM.  This was her cat, up-to-date on immunizations including rabies prophylaxis.  She states she was trying to give the cat a pill when it bit her on the left thumb.  She states she tried to avoid come to the ED, however her PCP insisted when she began having some swelling to the thumb later in the evening.  Pain is worse with movement and palpation.  No interventions tried prior to arrival.    Last tetanus is unknown.  No other complaints.       The history is provided by the patient.    Past Medical History:  Diagnosis Date  . Arthritis   . Cancer [facility]) 1978    cervical cancer  . Headache(784.0)   . History of migraine    last one was on 06/02/12;takes Topamax bid and Imitrex prn  . Hyperlipidemia    takes Simvastatin nightly  . Insomnia    takes Trazodone nightly  . Joint pain   . Joint swelling   . Seasonal allergies    in spring    Patient Active Problem List   Diagnosis Date Noted  . Arthrofibrosis of knee joint 06/07/2015  . Status post right knee replacement 06/07/2015  . Primary osteoarthritis of right knee 04/05/2015  . Pain due to knee joint prosthesis ([place]) 04/05/2015  . Status post revision of total replacement of left knee 04/15/2013  . Pain due to total left knee replacement ([place]) 03/27/2013  . Muscle weakness (generalized) 07/02/2012  . Total knee replacement status 07/02/2012  . Difficulty in walking(719.7) 07/02/2012  . Stiffness of left knee 07/02/2012  . Pain in left knee 07/02/2012    Past Surgical History:  Procedure Laterality Date  . ABDOMINAL HYSTERECTOMY  1978  . APPENDECTOMY  2008  .  ARTROPLASTY    . bladder tacked  1999  . BREAST EXCISIONAL BIOPSY    . BREAST SURGERY  2009   cyst removed from left breast  . COLONOSCOPY    . cyst removed from right knee   1972  . KNEE CLOSED REDUCTION Right 06/07/2015   Procedure: CLOSED MANIPULATION RIGHT KNEE;  Surgeon: Dorna Leitz, MD;  Location: [place];  Service: Orthopedics;  Laterality: Right;  . left knee surgery  1973  . left knee surgery  2012  . left knee surgery  2012  . right carpal tunnel    . right knee arthroscopy  90's    x 3  . right knee partial replacement  1991  . right wrist sugery  2008  . right wrist surgery    . scar tissue removed  2008  . TOTAL KNEE ARTHROPLASTY  06/12/2012   LEFT KNEE  . TOTAL KNEE ARTHROPLASTY  06/12/2012   Procedure: TOTAL KNEE ARTHROPLASTY;  Surgeon: Ninetta Lights, MD;  Location: [place];  Service: Orthopedics;  Laterality: Left;  . TOTAL KNEE ARTHROPLASTY Right 04/05/2015  . TOTAL KNEE REVISION Left 03/26/2013   Procedure: TOTAL KNEE REVISION- left;  Surgeon: Alta Corning, MD;  Location: [place];  Service: Orthopedics;  Laterality: Left;  . TOTAL KNEE REVISION  Right 04/05/2015   Procedure: TOTAL KNEE REVISION;  Surgeon: Dorna Leitz, MD;  Location: [place];  Service: Orthopedics;  Laterality: Right;     OB History    Gravida  3   Para  3   Term  1   Preterm  2   AB      Living  1     SAB      TAB      Ectopic      Multiple      Live Births               Home Medications    Prior to Admission medications   Medication Sig Start Date End Date Taking? Authorizing Provider  amoxicillin-clavulanate (AUGMENTIN) 875-125 MG tablet Take 1 tablet by mouth every 12 (twelve) hours. 12/05/18   Robinson, [place] N, PA-C  aspirin EC 325 MG tablet Take 1 tablet (325 mg total) by mouth 2 (two) times daily after a meal. Take x 1 month post op to decrease risk of blood clots. 04/05/15   Gary Fleet, PA-C  oxyCODONE-acetaminophen (PERCOCET/ROXICET) 5-325 MG  tablet Take 1-2 tablets by mouth every 4 (four) hours as needed for severe pain. 04/05/15   Gary Fleet, PA-C  SUMAtriptan (IMITREX) 100 MG tablet Take 100 mg by mouth every 2 (two) hours as needed for migraine.     [provider]  tiZANidine (ZANAFLEX) 2 MG tablet Take 1 tablet (2 mg total) by mouth every 6 (six) hours as needed for muscle spasms. 04/05/15   Gary Fleet, PA-C  topiramate (TOPAMAX) 50 MG tablet Take 50 mg by mouth 2 (two) times daily.    [provider]  traZODone (DESYREL) 50 MG tablet Take 100 mg by mouth at bedtime.    [provider]    Family History History reviewed. No pertinent family history.  Social History Social History   Tobacco Use  . Smoking status: Former Smoker    Last attempt to quit: 06/12/2000    Years since quitting: 18.4  . Smokeless tobacco: Never Used  . Tobacco comment: quit 10+yrs ago  Substance Use Topics  . Alcohol use: No  . Drug use: No     Allergies   Mupirocin   Review of Systems Review of Systems  Musculoskeletal: Positive for myalgias.  Skin: Positive for wound.     Physical Exam Updated Vital Signs BP 136/82 (BP Location: Right Arm)   Pulse 87   Temp 98.2 F (36.8 C) (Oral)   Resp 18   Ht 5\' 6"  (1.676 m)   Wt 67.1 kg   SpO2 100%   BMI 23.89 kg/m   Physical Exam Vitals signs and nursing note reviewed.  Constitutional:      General: She is not in acute distress.    Appearance: She is well-developed.  HENT:     Head: Normocephalic and atraumatic.  Eyes:     Conjunctiva/sclera: Conjunctivae normal.  Cardiovascular:     Rate and Rhythm: Normal rate.  Pulmonary:     Effort: Pulmonary effort is normal.  Musculoskeletal:     Comments: 2 very small puncture wounds to the left thumb, one on the palmar aspect and the second on the dorsal aspect.  No obvious foreign bodies.  Thumb with some swelling and generalized redness.  Normal sensation.  Normal range of motion.  Neurological:      Mental Status: She is alert.  Psychiatric:        Mood and Affect: Mood  normal.        Behavior: Behavior normal.      ED Treatments / Results  Labs (all labs ordered are listed, but only abnormal results are displayed) Labs Reviewed - No data to display  EKG None  Radiology Dg Finger Thumb Left  Result Date: 12/05/2018 CLINICAL DATA:  Initial evaluation for acute trauma, CT apparent EXAM: LEFT THUMB 2+V COMPARISON:  None. FINDINGS: No acute fracture or dislocation. No appreciable soft tissue injury. No radiopaque foreign body. Mild osteoarthritic changes about the visualized hand. Osseous mineralization normal. No discrete osseous lesion. IMPRESSION: No acute osseous abnormality about the left thumb. Electronically Signed   By: Jeannine Boga M.D.   On: 12/05/2018 20:21    Procedures Procedures (including critical care time)  Medications Ordered in ED Medications  Tdap (BOOSTRIX) injection 0.5 mL (0.5 mLs Intramuscular Given 12/05/18 2010)     Initial Impression / Assessment and Plan / ED Course  I have reviewed the triage vital signs and the nursing notes.  Pertinent labs & imaging results that were available during my care of the patient were reviewed by me and considered in my medical decision making (see chart for details).        Patient with cat bite to left thumb that occurred today.  X-ray is negative for retained foreign body or fracture.  Tetanus updated.  Patient's cat is up-to-date on rabies vaccines, will hold on rabies prophylaxis.  Discussed proper wound care.  Close follow-up for recheck.  Augmentin prescribed.  Agreeable to plan and safe for discharge.  Discussed results, findings, treatment and follow up. Patient advised of return precautions. Patient verbalized understanding and agreed with plan.  Final Clinical Impressions(s) / ED Diagnoses   Final diagnoses:  Cat bite of thumb, left, initial encounter    ED Discharge Orders          Ordered    amoxicillin-clavulanate (AUGMENTIN) 875-125 MG tablet  Every 12 hours     12/05/18 2007           Robinson, [place] N, Hershal Coria 12/05/18 2028    Margette Fast, MD 12/06/18 1342

## 2018-12-05 NOTE — ED Triage Notes (Signed)
Patient bitten by cat on her L thumb. Edema and redness noted. Cat was patient's. She reports she was trying to give it a pill. UTD on shots.

## 2019-01-21 DIAGNOSIS — M1991 Primary osteoarthritis, unspecified site: Secondary | ICD-10-CM | POA: Diagnosis not present

## 2019-01-21 DIAGNOSIS — T8484XA Pain due to internal orthopedic prosthetic devices, implants and grafts, initial encounter: Secondary | ICD-10-CM | POA: Diagnosis not present

## 2019-01-21 DIAGNOSIS — Z6824 Body mass index (BMI) 24.0-24.9, adult: Secondary | ICD-10-CM | POA: Diagnosis not present

## 2019-01-21 DIAGNOSIS — G43909 Migraine, unspecified, not intractable, without status migrainosus: Secondary | ICD-10-CM | POA: Diagnosis not present

## 2019-01-22 ENCOUNTER — Other Ambulatory Visit (HOSPITAL_COMMUNITY): Payer: Self-pay | Admitting: Internal Medicine

## 2019-01-22 ENCOUNTER — Other Ambulatory Visit: Payer: Self-pay | Admitting: Internal Medicine

## 2019-01-22 DIAGNOSIS — Z122 Encounter for screening for malignant neoplasm of respiratory organs: Secondary | ICD-10-CM

## 2019-01-23 ENCOUNTER — Other Ambulatory Visit (HOSPITAL_COMMUNITY): Payer: Self-pay | Admitting: Internal Medicine

## 2019-01-23 DIAGNOSIS — Z122 Encounter for screening for malignant neoplasm of respiratory organs: Secondary | ICD-10-CM

## 2019-02-11 ENCOUNTER — Other Ambulatory Visit: Payer: Self-pay

## 2019-02-11 ENCOUNTER — Ambulatory Visit (HOSPITAL_COMMUNITY)
Admission: RE | Admit: 2019-02-11 | Discharge: 2019-02-11 | Disposition: A | Discharge status: Home / Self Care | Payer: Medicare Other | Source: Ambulatory Visit | Attending: Internal Medicine | Admitting: Internal Medicine

## 2019-02-11 DIAGNOSIS — R918 Other nonspecific abnormal finding of lung field: Secondary | ICD-10-CM | POA: Diagnosis not present

## 2019-02-11 DIAGNOSIS — Z87891 Personal history of nicotine dependence: Secondary | ICD-10-CM | POA: Diagnosis not present

## 2019-02-11 DIAGNOSIS — J439 Emphysema, unspecified: Secondary | ICD-10-CM | POA: Diagnosis not present

## 2019-02-11 DIAGNOSIS — Z801 Family history of malignant neoplasm of trachea, bronchus and lung: Secondary | ICD-10-CM | POA: Insufficient documentation

## 2019-02-11 DIAGNOSIS — Z122 Encounter for screening for malignant neoplasm of respiratory organs: Secondary | ICD-10-CM | POA: Diagnosis not present

## 2019-02-12 ENCOUNTER — Other Ambulatory Visit: Payer: Self-pay | Admitting: Obstetrics and Gynecology

## 2019-02-12 DIAGNOSIS — Z1231 Encounter for screening mammogram for malignant neoplasm of breast: Secondary | ICD-10-CM

## 2019-02-25 DIAGNOSIS — Z9889 Other specified postprocedural states: Secondary | ICD-10-CM | POA: Diagnosis not present

## 2019-02-25 DIAGNOSIS — M79675 Pain in left toe(s): Secondary | ICD-10-CM | POA: Diagnosis not present

## 2019-02-25 DIAGNOSIS — Z4789 Encounter for other orthopedic aftercare: Secondary | ICD-10-CM | POA: Diagnosis not present

## 2019-02-25 DIAGNOSIS — D2122 Benign neoplasm of connective and other soft tissue of left lower limb, including hip: Secondary | ICD-10-CM | POA: Diagnosis not present

## 2019-02-25 DIAGNOSIS — L988 Other specified disorders of the skin and subcutaneous tissue: Secondary | ICD-10-CM | POA: Diagnosis not present

## 2019-03-28 ENCOUNTER — Other Ambulatory Visit: Payer: Self-pay

## 2019-03-28 ENCOUNTER — Ambulatory Visit
Admission: RE | Admit: 2019-03-28 | Discharge: 2019-03-28 | Disposition: A | Discharge status: Home / Self Care | Payer: Medicare Other | Source: Ambulatory Visit | Attending: Obstetrics and Gynecology | Admitting: Obstetrics and Gynecology

## 2019-03-28 DIAGNOSIS — Z1231 Encounter for screening mammogram for malignant neoplasm of breast: Secondary | ICD-10-CM

## 2019-04-11 DIAGNOSIS — Z1211 Encounter for screening for malignant neoplasm of colon: Secondary | ICD-10-CM | POA: Diagnosis not present

## 2019-04-18 DIAGNOSIS — Z6825 Body mass index (BMI) 25.0-25.9, adult: Secondary | ICD-10-CM | POA: Diagnosis not present

## 2019-04-18 DIAGNOSIS — R05 Cough: Secondary | ICD-10-CM | POA: Diagnosis not present

## 2019-04-18 DIAGNOSIS — Z1389 Encounter for screening for other disorder: Secondary | ICD-10-CM | POA: Diagnosis not present

## 2019-04-18 DIAGNOSIS — J679 Hypersensitivity pneumonitis due to unspecified organic dust: Secondary | ICD-10-CM | POA: Diagnosis not present

## 2019-04-18 DIAGNOSIS — Z0001 Encounter for general adult medical examination with abnormal findings: Secondary | ICD-10-CM | POA: Diagnosis not present

## 2019-04-18 DIAGNOSIS — Z23 Encounter for immunization: Secondary | ICD-10-CM | POA: Diagnosis not present

## 2019-04-18 DIAGNOSIS — J449 Chronic obstructive pulmonary disease, unspecified: Secondary | ICD-10-CM | POA: Diagnosis not present

## 2019-05-26 DIAGNOSIS — Z882 Allergy status to sulfonamides status: Secondary | ICD-10-CM | POA: Diagnosis not present

## 2019-05-26 DIAGNOSIS — M19032 Primary osteoarthritis, left wrist: Secondary | ICD-10-CM | POA: Diagnosis not present

## 2019-05-26 DIAGNOSIS — M19031 Primary osteoarthritis, right wrist: Secondary | ICD-10-CM | POA: Diagnosis not present

## 2019-05-26 DIAGNOSIS — M19039 Primary osteoarthritis, unspecified wrist: Secondary | ICD-10-CM | POA: Diagnosis not present

## 2019-05-26 DIAGNOSIS — M19132 Post-traumatic osteoarthritis, left wrist: Secondary | ICD-10-CM | POA: Diagnosis not present

## 2019-05-26 DIAGNOSIS — M1812 Unilateral primary osteoarthritis of first carpometacarpal joint, left hand: Secondary | ICD-10-CM | POA: Diagnosis not present

## 2019-07-09 DIAGNOSIS — Z6827 Body mass index (BMI) 27.0-27.9, adult: Secondary | ICD-10-CM | POA: Diagnosis not present

## 2019-07-09 DIAGNOSIS — Z779 Other contact with and (suspected) exposures hazardous to health: Secondary | ICD-10-CM | POA: Diagnosis not present

## 2019-07-09 DIAGNOSIS — Z01419 Encounter for gynecological examination (general) (routine) without abnormal findings: Secondary | ICD-10-CM | POA: Diagnosis not present

## 2019-10-22 DIAGNOSIS — G894 Chronic pain syndrome: Secondary | ICD-10-CM | POA: Diagnosis not present

## 2019-10-22 DIAGNOSIS — M1991 Primary osteoarthritis, unspecified site: Secondary | ICD-10-CM | POA: Diagnosis not present

## 2019-10-28 DIAGNOSIS — L2084 Intrinsic (allergic) eczema: Secondary | ICD-10-CM | POA: Diagnosis not present

## 2019-10-28 DIAGNOSIS — L821 Other seborrheic keratosis: Secondary | ICD-10-CM | POA: Diagnosis not present

## 2019-10-28 DIAGNOSIS — B001 Herpesviral vesicular dermatitis: Secondary | ICD-10-CM | POA: Diagnosis not present

## 2019-10-29 DIAGNOSIS — D2122 Benign neoplasm of connective and other soft tissue of left lower limb, including hip: Secondary | ICD-10-CM | POA: Diagnosis not present

## 2019-11-21 DIAGNOSIS — M7989 Other specified soft tissue disorders: Secondary | ICD-10-CM | POA: Diagnosis not present

## 2019-12-09 DIAGNOSIS — M67472 Ganglion, left ankle and foot: Secondary | ICD-10-CM | POA: Diagnosis not present

## 2019-12-30 DIAGNOSIS — L814 Other melanin hyperpigmentation: Secondary | ICD-10-CM | POA: Diagnosis not present

## 2019-12-30 DIAGNOSIS — Z85828 Personal history of other malignant neoplasm of skin: Secondary | ICD-10-CM | POA: Diagnosis not present

## 2019-12-30 DIAGNOSIS — L821 Other seborrheic keratosis: Secondary | ICD-10-CM | POA: Diagnosis not present

## 2019-12-30 DIAGNOSIS — D239 Other benign neoplasm of skin, unspecified: Secondary | ICD-10-CM | POA: Diagnosis not present

## 2020-01-15 DIAGNOSIS — M25531 Pain in right wrist: Secondary | ICD-10-CM | POA: Diagnosis not present

## 2020-01-15 DIAGNOSIS — M19031 Primary osteoarthritis, right wrist: Secondary | ICD-10-CM | POA: Diagnosis not present

## 2020-01-15 DIAGNOSIS — G8929 Other chronic pain: Secondary | ICD-10-CM | POA: Diagnosis not present

## 2020-03-29 ENCOUNTER — Other Ambulatory Visit: Payer: Self-pay | Admitting: Obstetrics and Gynecology

## 2020-03-29 DIAGNOSIS — Z1231 Encounter for screening mammogram for malignant neoplasm of breast: Secondary | ICD-10-CM

## 2020-04-16 ENCOUNTER — Ambulatory Visit: Payer: Medicare Other

## 2020-04-19 ENCOUNTER — Ambulatory Visit
Admission: RE | Admit: 2020-04-19 | Discharge: 2020-04-19 | Disposition: A | Discharge status: Home / Self Care | Payer: Medicare Other | Source: Ambulatory Visit | Attending: Obstetrics and Gynecology | Admitting: Obstetrics and Gynecology

## 2020-04-19 ENCOUNTER — Other Ambulatory Visit: Payer: Self-pay

## 2020-04-19 DIAGNOSIS — Z1231 Encounter for screening mammogram for malignant neoplasm of breast: Secondary | ICD-10-CM

## 2020-04-27 ENCOUNTER — Other Ambulatory Visit (HOSPITAL_COMMUNITY): Payer: Self-pay | Admitting: Internal Medicine

## 2020-04-27 DIAGNOSIS — Z122 Encounter for screening for malignant neoplasm of respiratory organs: Secondary | ICD-10-CM

## 2020-04-28 ENCOUNTER — Other Ambulatory Visit (HOSPITAL_COMMUNITY): Payer: Self-pay | Admitting: Internal Medicine

## 2020-04-28 DIAGNOSIS — R9389 Abnormal findings on diagnostic imaging of other specified body structures: Secondary | ICD-10-CM

## 2020-04-28 DIAGNOSIS — Z122 Encounter for screening for malignant neoplasm of respiratory organs: Secondary | ICD-10-CM

## 2020-04-28 DIAGNOSIS — Z87891 Personal history of nicotine dependence: Secondary | ICD-10-CM

## 2020-04-28 DIAGNOSIS — J449 Chronic obstructive pulmonary disease, unspecified: Secondary | ICD-10-CM

## 2020-05-11 ENCOUNTER — Ambulatory Visit: Payer: Medicare Other

## 2020-05-14 LAB — COLOGUARD: COLOGUARD: NEGATIVE

## 2020-05-25 ENCOUNTER — Ambulatory Visit (HOSPITAL_COMMUNITY)
Admission: RE | Admit: 2020-05-25 | Discharge: 2020-05-25 | Disposition: A | Discharge status: Home / Self Care | Payer: Medicare Other | Source: Ambulatory Visit | Attending: Internal Medicine | Admitting: Internal Medicine

## 2020-05-25 ENCOUNTER — Other Ambulatory Visit: Payer: Self-pay

## 2020-05-25 DIAGNOSIS — Z87891 Personal history of nicotine dependence: Secondary | ICD-10-CM | POA: Diagnosis present

## 2020-05-25 DIAGNOSIS — Z122 Encounter for screening for malignant neoplasm of respiratory organs: Secondary | ICD-10-CM | POA: Diagnosis present

## 2021-03-08 ENCOUNTER — Other Ambulatory Visit: Payer: Self-pay | Admitting: Obstetrics and Gynecology

## 2021-03-08 DIAGNOSIS — Z1231 Encounter for screening mammogram for malignant neoplasm of breast: Secondary | ICD-10-CM

## 2021-04-22 DIAGNOSIS — R5383 Other fatigue: Secondary | ICD-10-CM | POA: Diagnosis not present

## 2021-04-22 DIAGNOSIS — E559 Vitamin D deficiency, unspecified: Secondary | ICD-10-CM | POA: Diagnosis not present

## 2021-04-22 DIAGNOSIS — E539 Vitamin B deficiency, unspecified: Secondary | ICD-10-CM | POA: Diagnosis not present

## 2021-04-22 DIAGNOSIS — Z0000 Encounter for general adult medical examination without abnormal findings: Secondary | ICD-10-CM | POA: Diagnosis not present

## 2021-04-22 DIAGNOSIS — Z9229 Personal history of other drug therapy: Secondary | ICD-10-CM | POA: Diagnosis not present

## 2021-04-22 DIAGNOSIS — Z122 Encounter for screening for malignant neoplasm of respiratory organs: Secondary | ICD-10-CM | POA: Diagnosis not present

## 2021-04-22 DIAGNOSIS — J449 Chronic obstructive pulmonary disease, unspecified: Secondary | ICD-10-CM | POA: Diagnosis not present

## 2021-04-22 DIAGNOSIS — G43709 Chronic migraine without aura, not intractable, without status migrainosus: Secondary | ICD-10-CM | POA: Diagnosis not present

## 2021-04-22 DIAGNOSIS — G43909 Migraine, unspecified, not intractable, without status migrainosus: Secondary | ICD-10-CM | POA: Diagnosis not present

## 2021-04-25 ENCOUNTER — Other Ambulatory Visit (HOSPITAL_COMMUNITY): Payer: Self-pay | Admitting: Internal Medicine

## 2021-04-25 ENCOUNTER — Other Ambulatory Visit: Payer: Self-pay | Admitting: Internal Medicine

## 2021-04-25 DIAGNOSIS — Z122 Encounter for screening for malignant neoplasm of respiratory organs: Secondary | ICD-10-CM

## 2021-04-27 ENCOUNTER — Other Ambulatory Visit (HOSPITAL_COMMUNITY): Payer: Self-pay | Admitting: Internal Medicine

## 2021-04-27 ENCOUNTER — Other Ambulatory Visit: Payer: Self-pay | Admitting: Internal Medicine

## 2021-04-27 ENCOUNTER — Ambulatory Visit
Admission: RE | Admit: 2021-04-27 | Discharge: 2021-04-27 | Disposition: A | Discharge status: Home / Self Care | Payer: Medicare Other | Source: Ambulatory Visit | Attending: Obstetrics and Gynecology | Admitting: Obstetrics and Gynecology

## 2021-04-27 ENCOUNTER — Other Ambulatory Visit: Payer: Self-pay

## 2021-04-27 DIAGNOSIS — Z1231 Encounter for screening mammogram for malignant neoplasm of breast: Secondary | ICD-10-CM

## 2021-04-27 DIAGNOSIS — F17210 Nicotine dependence, cigarettes, uncomplicated: Secondary | ICD-10-CM

## 2021-04-27 DIAGNOSIS — F17209 Nicotine dependence, unspecified, with unspecified nicotine-induced disorders: Secondary | ICD-10-CM

## 2021-05-15 IMAGING — DX LEFT THUMB 2+V
3 series · 3 of 3 positions shown · non-contrast
Comparison: None.

CLINICAL DATA: Initial evaluation for acute trauma, CT apparent

EXAM:
LEFT THUMB 2+V

[finger ap]
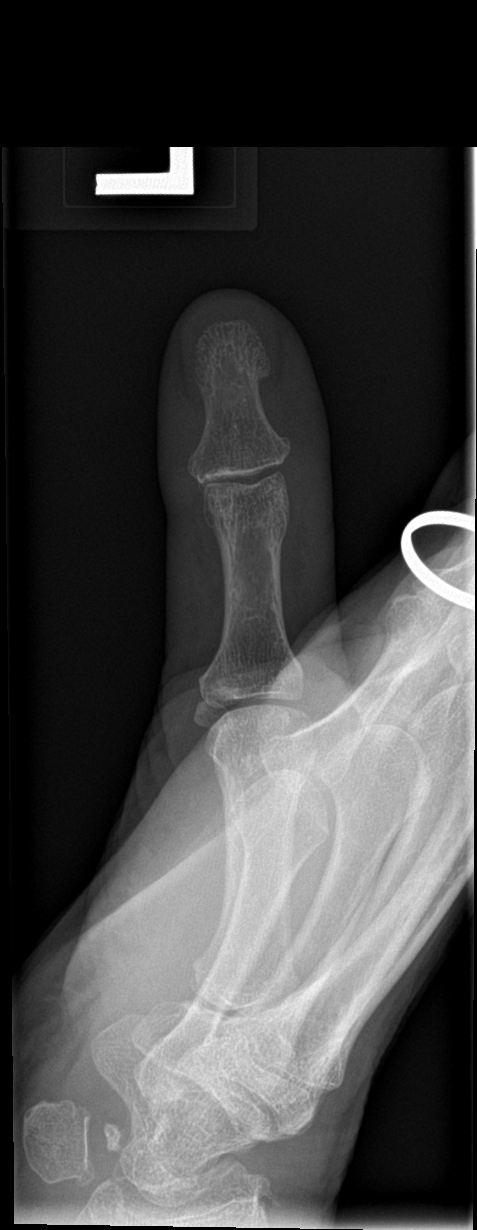

[finger obl]
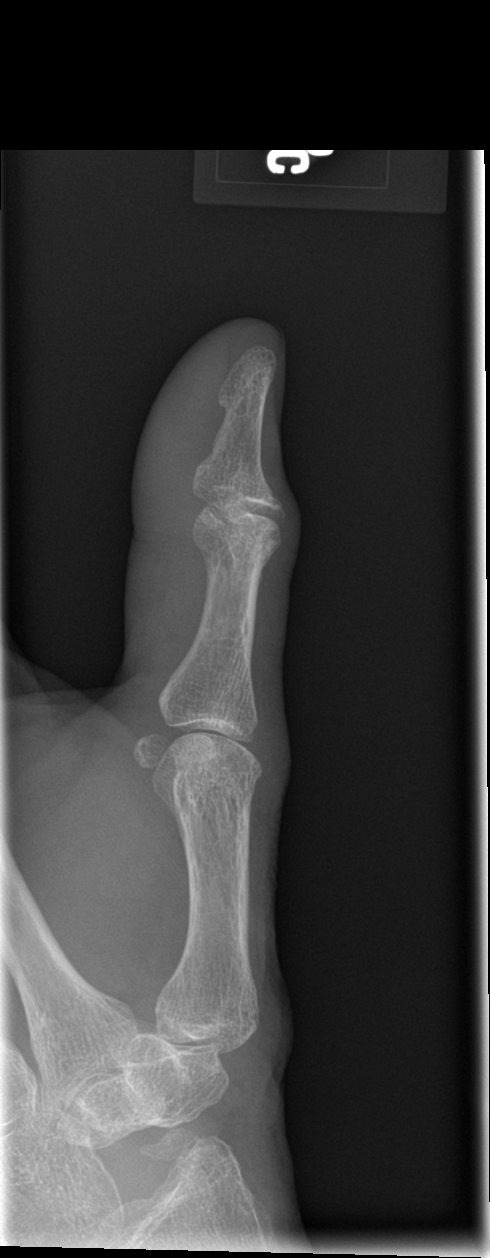

[finger lat]
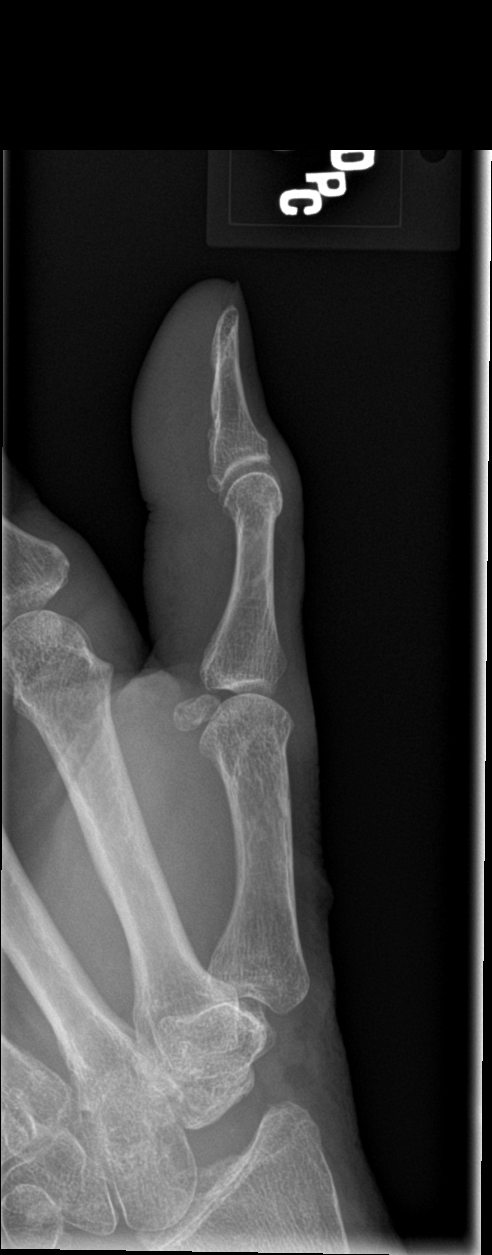

[3 of 3 positions shown; findings below may reference images not displayed]

FINDINGS: No acute fracture or dislocation. No appreciable soft tissue injury.
No radiopaque foreign body. Mild osteoarthritic changes about the
visualized hand. Osseous mineralization normal. No discrete osseous
lesion.
IMPRESSION: No acute osseous abnormality about the left thumb.

## 2021-06-02 ENCOUNTER — Ambulatory Visit (HOSPITAL_COMMUNITY): Admission: RE | Admit: 2021-06-02 | Payer: Medicare Other | Source: Ambulatory Visit

## 2021-06-09 ENCOUNTER — Ambulatory Visit (HOSPITAL_COMMUNITY)
Admission: RE | Admit: 2021-06-09 | Discharge: 2021-06-09 | Disposition: A | Discharge status: Home / Self Care | Payer: Medicare Other | Source: Ambulatory Visit | Attending: Internal Medicine | Admitting: Internal Medicine

## 2021-06-09 ENCOUNTER — Other Ambulatory Visit: Payer: Self-pay

## 2021-06-09 DIAGNOSIS — F17209 Nicotine dependence, unspecified, with unspecified nicotine-induced disorders: Secondary | ICD-10-CM | POA: Insufficient documentation

## 2021-06-09 DIAGNOSIS — F17210 Nicotine dependence, cigarettes, uncomplicated: Secondary | ICD-10-CM | POA: Diagnosis present

## 2021-07-20 DIAGNOSIS — Z6822 Body mass index (BMI) 22.0-22.9, adult: Secondary | ICD-10-CM | POA: Diagnosis not present

## 2021-07-20 DIAGNOSIS — Z779 Other contact with and (suspected) exposures hazardous to health: Secondary | ICD-10-CM | POA: Diagnosis not present

## 2021-08-18 DIAGNOSIS — G43909 Migraine, unspecified, not intractable, without status migrainosus: Secondary | ICD-10-CM | POA: Diagnosis not present

## 2021-08-18 DIAGNOSIS — E785 Hyperlipidemia, unspecified: Secondary | ICD-10-CM | POA: Diagnosis not present

## 2021-08-18 DIAGNOSIS — Z9229 Personal history of other drug therapy: Secondary | ICD-10-CM | POA: Diagnosis not present

## 2021-08-18 DIAGNOSIS — J449 Chronic obstructive pulmonary disease, unspecified: Secondary | ICD-10-CM | POA: Diagnosis not present

## 2021-09-13 DIAGNOSIS — D239 Other benign neoplasm of skin, unspecified: Secondary | ICD-10-CM | POA: Diagnosis not present

## 2021-09-13 DIAGNOSIS — L578 Other skin changes due to chronic exposure to nonionizing radiation: Secondary | ICD-10-CM | POA: Diagnosis not present

## 2021-09-13 DIAGNOSIS — Z23 Encounter for immunization: Secondary | ICD-10-CM | POA: Diagnosis not present

## 2021-09-13 DIAGNOSIS — B001 Herpesviral vesicular dermatitis: Secondary | ICD-10-CM | POA: Diagnosis not present

## 2021-09-13 DIAGNOSIS — L814 Other melanin hyperpigmentation: Secondary | ICD-10-CM | POA: Diagnosis not present

## 2021-09-13 DIAGNOSIS — L821 Other seborrheic keratosis: Secondary | ICD-10-CM | POA: Diagnosis not present

## 2021-09-13 DIAGNOSIS — D225 Melanocytic nevi of trunk: Secondary | ICD-10-CM | POA: Diagnosis not present

## 2021-09-13 DIAGNOSIS — D485 Neoplasm of uncertain behavior of skin: Secondary | ICD-10-CM | POA: Diagnosis not present

## 2021-09-13 DIAGNOSIS — Z85828 Personal history of other malignant neoplasm of skin: Secondary | ICD-10-CM | POA: Diagnosis not present

## 2021-10-14 DIAGNOSIS — M19031 Primary osteoarthritis, right wrist: Secondary | ICD-10-CM | POA: Diagnosis not present

## 2021-10-14 DIAGNOSIS — M8588 Other specified disorders of bone density and structure, other site: Secondary | ICD-10-CM | POA: Diagnosis not present

## 2021-10-14 DIAGNOSIS — M1811 Unilateral primary osteoarthritis of first carpometacarpal joint, right hand: Secondary | ICD-10-CM | POA: Diagnosis not present

## 2021-10-14 DIAGNOSIS — M25831 Other specified joint disorders, right wrist: Secondary | ICD-10-CM | POA: Diagnosis not present

## 2021-11-12 DIAGNOSIS — M7632 Iliotibial band syndrome, left leg: Secondary | ICD-10-CM | POA: Diagnosis not present

## 2021-11-12 DIAGNOSIS — Z96651 Presence of right artificial knee joint: Secondary | ICD-10-CM | POA: Diagnosis not present

## 2021-11-12 DIAGNOSIS — M25562 Pain in left knee: Secondary | ICD-10-CM | POA: Diagnosis not present

## 2021-11-12 DIAGNOSIS — Z96652 Presence of left artificial knee joint: Secondary | ICD-10-CM | POA: Diagnosis not present

## 2021-12-15 DIAGNOSIS — M79675 Pain in left toe(s): Secondary | ICD-10-CM | POA: Diagnosis not present

## 2021-12-15 DIAGNOSIS — M67472 Ganglion, left ankle and foot: Secondary | ICD-10-CM | POA: Diagnosis not present

## 2021-12-15 DIAGNOSIS — M25562 Pain in left knee: Secondary | ICD-10-CM | POA: Diagnosis not present

## 2021-12-15 DIAGNOSIS — Z96652 Presence of left artificial knee joint: Secondary | ICD-10-CM | POA: Diagnosis not present

## 2022-03-15 ENCOUNTER — Other Ambulatory Visit: Payer: Self-pay | Admitting: Internal Medicine

## 2022-03-15 DIAGNOSIS — Z1231 Encounter for screening mammogram for malignant neoplasm of breast: Secondary | ICD-10-CM

## 2022-04-28 ENCOUNTER — Ambulatory Visit
Admission: RE | Admit: 2022-04-28 | Discharge: 2022-04-28 | Disposition: A | Discharge status: Home / Self Care | Payer: Medicare Other | Source: Ambulatory Visit | Attending: Internal Medicine | Admitting: Internal Medicine

## 2022-04-28 DIAGNOSIS — Z1231 Encounter for screening mammogram for malignant neoplasm of breast: Secondary | ICD-10-CM | POA: Diagnosis not present

## 2022-05-03 ENCOUNTER — Other Ambulatory Visit (HOSPITAL_COMMUNITY): Payer: Self-pay | Admitting: Internal Medicine

## 2022-05-03 DIAGNOSIS — M150 Primary generalized (osteo)arthritis: Secondary | ICD-10-CM | POA: Diagnosis not present

## 2022-05-03 DIAGNOSIS — J449 Chronic obstructive pulmonary disease, unspecified: Secondary | ICD-10-CM | POA: Diagnosis not present

## 2022-05-03 DIAGNOSIS — Z0001 Encounter for general adult medical examination with abnormal findings: Secondary | ICD-10-CM | POA: Diagnosis not present

## 2022-05-03 DIAGNOSIS — Z122 Encounter for screening for malignant neoplasm of respiratory organs: Secondary | ICD-10-CM

## 2022-05-03 DIAGNOSIS — D518 Other vitamin B12 deficiency anemias: Secondary | ICD-10-CM | POA: Diagnosis not present

## 2022-05-03 DIAGNOSIS — I700 Atherosclerosis of aorta: Secondary | ICD-10-CM | POA: Diagnosis not present

## 2022-05-03 DIAGNOSIS — Z1331 Encounter for screening for depression: Secondary | ICD-10-CM | POA: Diagnosis not present

## 2022-05-03 DIAGNOSIS — Z87891 Personal history of nicotine dependence: Secondary | ICD-10-CM

## 2022-05-03 DIAGNOSIS — Z6824 Body mass index (BMI) 24.0-24.9, adult: Secondary | ICD-10-CM | POA: Diagnosis not present

## 2022-05-03 DIAGNOSIS — E559 Vitamin D deficiency, unspecified: Secondary | ICD-10-CM | POA: Diagnosis not present

## 2022-05-03 DIAGNOSIS — E039 Hypothyroidism, unspecified: Secondary | ICD-10-CM | POA: Diagnosis not present

## 2022-05-03 DIAGNOSIS — Z23 Encounter for immunization: Secondary | ICD-10-CM | POA: Diagnosis not present

## 2022-05-03 DIAGNOSIS — G43909 Migraine, unspecified, not intractable, without status migrainosus: Secondary | ICD-10-CM | POA: Diagnosis not present

## 2022-06-19 ENCOUNTER — Ambulatory Visit (HOSPITAL_COMMUNITY)
Admission: RE | Admit: 2022-06-19 | Discharge: 2022-06-19 | Disposition: A | Discharge status: Home / Self Care | Payer: Medicare Other | Source: Ambulatory Visit | Attending: Internal Medicine | Admitting: Internal Medicine

## 2022-06-19 DIAGNOSIS — Z122 Encounter for screening for malignant neoplasm of respiratory organs: Secondary | ICD-10-CM | POA: Diagnosis not present

## 2022-06-19 DIAGNOSIS — Z87891 Personal history of nicotine dependence: Secondary | ICD-10-CM | POA: Insufficient documentation

## 2022-07-17 DIAGNOSIS — Z1272 Encounter for screening for malignant neoplasm of vagina: Secondary | ICD-10-CM | POA: Diagnosis not present

## 2022-07-17 DIAGNOSIS — Z6825 Body mass index (BMI) 25.0-25.9, adult: Secondary | ICD-10-CM | POA: Diagnosis not present

## 2022-07-17 DIAGNOSIS — R635 Abnormal weight gain: Secondary | ICD-10-CM | POA: Diagnosis not present

## 2022-07-17 DIAGNOSIS — Z779 Other contact with and (suspected) exposures hazardous to health: Secondary | ICD-10-CM | POA: Diagnosis not present

## 2022-07-17 DIAGNOSIS — Z8541 Personal history of malignant neoplasm of cervix uteri: Secondary | ICD-10-CM | POA: Diagnosis not present

## 2022-08-29 DIAGNOSIS — Z6822 Body mass index (BMI) 22.0-22.9, adult: Secondary | ICD-10-CM | POA: Diagnosis not present

## 2022-09-19 DIAGNOSIS — L814 Other melanin hyperpigmentation: Secondary | ICD-10-CM | POA: Diagnosis not present

## 2022-09-19 DIAGNOSIS — L578 Other skin changes due to chronic exposure to nonionizing radiation: Secondary | ICD-10-CM | POA: Diagnosis not present

## 2022-09-19 DIAGNOSIS — D239 Other benign neoplasm of skin, unspecified: Secondary | ICD-10-CM | POA: Diagnosis not present

## 2022-09-19 DIAGNOSIS — B351 Tinea unguium: Secondary | ICD-10-CM | POA: Diagnosis not present

## 2022-09-19 DIAGNOSIS — Z85828 Personal history of other malignant neoplasm of skin: Secondary | ICD-10-CM | POA: Diagnosis not present

## 2022-09-19 DIAGNOSIS — L821 Other seborrheic keratosis: Secondary | ICD-10-CM | POA: Diagnosis not present

## 2022-09-19 DIAGNOSIS — B001 Herpesviral vesicular dermatitis: Secondary | ICD-10-CM | POA: Diagnosis not present

## 2022-10-25 DIAGNOSIS — Z681 Body mass index (BMI) 19 or less, adult: Secondary | ICD-10-CM | POA: Diagnosis not present

## 2022-11-02 DIAGNOSIS — N952 Postmenopausal atrophic vaginitis: Secondary | ICD-10-CM | POA: Diagnosis not present

## 2022-12-18 ENCOUNTER — Encounter (HOSPITAL_COMMUNITY): Payer: Self-pay | Admitting: Orthopedic Surgery

## 2022-12-18 ENCOUNTER — Other Ambulatory Visit: Payer: Self-pay | Admitting: Orthopedic Surgery

## 2022-12-18 ENCOUNTER — Other Ambulatory Visit: Payer: Self-pay

## 2022-12-18 DIAGNOSIS — M25552 Pain in left hip: Secondary | ICD-10-CM | POA: Diagnosis not present

## 2022-12-18 DIAGNOSIS — M25562 Pain in left knee: Secondary | ICD-10-CM | POA: Diagnosis not present

## 2022-12-18 NOTE — Progress Notes (Addendum)
For Anesthesia: PCP - Laurie Nevins, MD  Cardiologist - N/A  Chest x-ray - N/A EKG - N/A Stress Test - N/A ECHO - N/A Cardiac Cath - N/A Pacemaker/ICD device last checked:N/A Pacemaker orders received: N/A Device Rep notified: N/A  Spinal Cord Stimulator:N/A  Sleep Study - N/A CPAP - N/A  Fasting Blood Sugar - N/A Checks Blood Sugar ___N/A__ times a day Date and result of last Hgb A1c-N/A  Last dose of GLP1 agonist- N/A GLP1 instructions: N/A  Last dose of SGLT-2 inhibitors- N/A SGLT-2 instructions:N/A  Blood Thinner Instructions:N/A Aspirin Instructions:  Last Dose: 12/17/2022  Activity level:   Able to exercise without chest pain and/or shortness of breath    Anesthesia review: N/A  Patient denies shortness of breath, fever, cough and chest pain during pre op phone call.   Patient verbalized understanding of instructions reviewed via telephone.

## 2022-12-19 ENCOUNTER — Observation Stay (HOSPITAL_COMMUNITY)
Admission: RE | Admit: 2022-12-19 | Discharge: 2022-12-20 | Disposition: A | Discharge status: Home Health | Payer: Medicare Other | Attending: Internal Medicine | Admitting: Internal Medicine

## 2022-12-19 ENCOUNTER — Inpatient Hospital Stay (HOSPITAL_COMMUNITY): Payer: Medicare Other | Admitting: Anesthesiology

## 2022-12-19 ENCOUNTER — Other Ambulatory Visit: Payer: Self-pay

## 2022-12-19 ENCOUNTER — Inpatient Hospital Stay (HOSPITAL_COMMUNITY): Payer: Medicare Other

## 2022-12-19 ENCOUNTER — Encounter (HOSPITAL_COMMUNITY)
Admission: RE | Disposition: A | Discharge status: Home Health | Payer: Self-pay | Source: Home / Self Care | Attending: Internal Medicine

## 2022-12-19 ENCOUNTER — Encounter (HOSPITAL_COMMUNITY): Payer: Self-pay | Admitting: Orthopedic Surgery

## 2022-12-19 DIAGNOSIS — E785 Hyperlipidemia, unspecified: Secondary | ICD-10-CM | POA: Diagnosis present

## 2022-12-19 DIAGNOSIS — Z87891 Personal history of nicotine dependence: Secondary | ICD-10-CM

## 2022-12-19 DIAGNOSIS — M1611 Unilateral primary osteoarthritis, right hip: Secondary | ICD-10-CM | POA: Diagnosis not present

## 2022-12-19 DIAGNOSIS — Z96653 Presence of artificial knee joint, bilateral: Secondary | ICD-10-CM | POA: Diagnosis not present

## 2022-12-19 DIAGNOSIS — S72002A Fracture of unspecified part of neck of left femur, initial encounter for closed fracture: Secondary | ICD-10-CM

## 2022-12-19 DIAGNOSIS — X58XXXA Exposure to other specified factors, initial encounter: Secondary | ICD-10-CM | POA: Insufficient documentation

## 2022-12-19 DIAGNOSIS — R9431 Abnormal electrocardiogram [ECG] [EKG]: Secondary | ICD-10-CM | POA: Diagnosis not present

## 2022-12-19 DIAGNOSIS — Z01818 Encounter for other preprocedural examination: Principal | ICD-10-CM

## 2022-12-19 DIAGNOSIS — F419 Anxiety disorder, unspecified: Secondary | ICD-10-CM

## 2022-12-19 DIAGNOSIS — G4700 Insomnia, unspecified: Secondary | ICD-10-CM | POA: Diagnosis present

## 2022-12-19 DIAGNOSIS — Z8541 Personal history of malignant neoplasm of cervix uteri: Secondary | ICD-10-CM | POA: Diagnosis not present

## 2022-12-19 DIAGNOSIS — Z8669 Personal history of other diseases of the nervous system and sense organs: Secondary | ICD-10-CM

## 2022-12-19 DIAGNOSIS — Z96642 Presence of left artificial hip joint: Secondary | ICD-10-CM | POA: Diagnosis not present

## 2022-12-19 DIAGNOSIS — Z7982 Long term (current) use of aspirin: Secondary | ICD-10-CM | POA: Diagnosis not present

## 2022-12-19 DIAGNOSIS — Z471 Aftercare following joint replacement surgery: Secondary | ICD-10-CM | POA: Diagnosis not present

## 2022-12-19 DIAGNOSIS — Z22322 Carrier or suspected carrier of Methicillin resistant Staphylococcus aureus: Secondary | ICD-10-CM

## 2022-12-19 HISTORY — DX: Other specified postprocedural states: Z98.890

## 2022-12-19 HISTORY — DX: Carpal tunnel syndrome, unspecified upper limb: G56.00

## 2022-12-19 HISTORY — PX: TOTAL HIP ARTHROPLASTY: SHX124

## 2022-12-19 LAB — CBC
HCT: 39.1 % (ref 36.0–46.0)
Hemoglobin: 12.3 g/dL (ref 12.0–15.0)
MCH: 30.1 pg (ref 26.0–34.0)
MCHC: 31.5 g/dL (ref 30.0–36.0)
MCV: 95.8 fL (ref 80.0–100.0)
Platelets: 262 10*3/uL (ref 150–400)
RBC: 4.08 MIL/uL (ref 3.87–5.11)
RDW: 13.9 % (ref 11.5–15.5)
WBC: 5.4 10*3/uL (ref 4.0–10.5)
nRBC: 0 % (ref 0.0–0.2)

## 2022-12-19 LAB — BASIC METABOLIC PANEL
Anion gap: 7 (ref 5–15)
BUN: 21 mg/dL (ref 8–23)
CO2: 25 mmol/L (ref 22–32)
Calcium: 8.8 mg/dL — ABNORMAL LOW (ref 8.9–10.3)
Chloride: 106 mmol/L (ref 98–111)
Creatinine, Ser: 0.7 mg/dL (ref 0.44–1.00)
GFR, Estimated: >60 mL/min (ref >60)
Glucose, Bld: 96 mg/dL (ref 70–99)
Potassium: 3.7 mmol/L (ref 3.5–5.1)
Sodium: 138 mmol/L (ref 135–145)

## 2022-12-19 LAB — HEPATIC FUNCTION PANEL
ALT: 15 U/L (ref 0–44)
AST: 22 U/L (ref 15–41)
Albumin: 3.6 g/dL (ref 3.5–5.0)
Alkaline Phosphatase: 53 U/L (ref 38–126)
Bilirubin, Direct: <0.1 mg/dL (ref 0.0–0.2)
Total Bilirubin: 0.6 mg/dL (ref 0.3–1.2)
Total Protein: 6.4 g/dL — ABNORMAL LOW (ref 6.5–8.1)

## 2022-12-19 LAB — TYPE AND SCREEN
ABO/RH(D): O POS
Antibody Screen: NEGATIVE

## 2022-12-19 LAB — MAGNESIUM: Magnesium: 2.1 mg/dL (ref 1.7–2.4)

## 2022-12-19 LAB — PHOSPHORUS: Phosphorus: 4.1 mg/dL (ref 2.5–4.6)

## 2022-12-19 LAB — SURGICAL PCR SCREEN
MRSA, PCR: NEGATIVE
Staphylococcus aureus: POSITIVE — AB

## 2022-12-19 SURGERY — ARTHROPLASTY, HIP, TOTAL, ANTERIOR APPROACH
Anesthesia: Spinal | Site: Hip | Laterality: Left

## 2022-12-19 MED ORDER — SODIUM CHLORIDE 0.9 % IV SOLN: INTRAVENOUS | Status: DC

## 2022-12-19 MED ORDER — HYDROMORPHONE HCL 1 MG/ML IJ SOLN: 0.5000 mg | INTRAMUSCULAR | Status: DC | PRN

## 2022-12-19 MED ORDER — OXYCODONE HCL 5 MG PO TABS
5.0000 mg | ORAL_TABLET | ORAL | Status: DC | PRN
  Administered 2022-12-19 – 2022-12-20 (×4): 5 mg via ORAL
  Filled 2022-12-19 (×3): qty 1

## 2022-12-19 MED ORDER — HYDROMORPHONE HCL 1 MG/ML IJ SOLN
0.2500 mg | INTRAMUSCULAR | Status: DC | PRN
  Administered 2022-12-19 (×3): 0.5 mg via INTRAVENOUS

## 2022-12-19 MED ORDER — ACETAMINOPHEN 500 MG PO TABS
1000.0000 mg | ORAL_TABLET | Freq: Four times a day (QID) | ORAL | Status: DC
  Administered 2022-12-19 – 2022-12-20 (×3): 1000 mg via ORAL
  Filled 2022-12-19 (×3): qty 2

## 2022-12-19 MED ORDER — MAGNESIUM CITRATE PO SOLN: 1.0000 | Freq: Once | ORAL | Status: DC | PRN

## 2022-12-19 MED ORDER — OXYCODONE HCL 5 MG PO TABS
ORAL_TABLET | ORAL | Status: AC
  Administered 2022-12-19: 5 mg via ORAL
  Filled 2022-12-19: qty 1

## 2022-12-19 MED ORDER — CICLOPIROX 8 % EX SOLN: 1.0000 | Freq: Every day | CUTANEOUS | Status: DC

## 2022-12-19 MED ORDER — ONDANSETRON HCL 4 MG/2ML IJ SOLN
INTRAMUSCULAR | Status: DC | PRN
  Administered 2022-12-19: 4 mg via INTRAVENOUS

## 2022-12-19 MED ORDER — WATER FOR IRRIGATION, STERILE IR SOLN
Status: DC | PRN
  Administered 2022-12-19: 2000 mL

## 2022-12-19 MED ORDER — BISACODYL 5 MG PO TBEC: 5.0000 mg | DELAYED_RELEASE_TABLET | Freq: Every day | ORAL | Status: DC | PRN

## 2022-12-19 MED ORDER — CEFAZOLIN SODIUM-DEXTROSE 2-4 GM/100ML-% IV SOLN
2.0000 g | Freq: Four times a day (QID) | INTRAVENOUS | Status: AC
  Administered 2022-12-19 – 2022-12-20 (×2): 2 g via INTRAVENOUS
  Filled 2022-12-19 (×2): qty 100

## 2022-12-19 MED ORDER — FENTANYL CITRATE PF 50 MCG/ML IJ SOSY
50.0000 ug | PREFILLED_SYRINGE | Freq: Once | INTRAMUSCULAR | Status: AC
  Administered 2022-12-19: 50 ug via INTRAVENOUS
  Filled 2022-12-19: qty 1

## 2022-12-19 MED ORDER — PHENYLEPHRINE HCL (PRESSORS) 10 MG/ML IV SOLN
INTRAVENOUS | Status: DC | PRN
  Administered 2022-12-19 (×4): 160 ug via INTRAVENOUS

## 2022-12-19 MED ORDER — KETAMINE HCL 50 MG/5ML IJ SOSY
PREFILLED_SYRINGE | INTRAMUSCULAR | Status: AC
  Filled 2022-12-19: qty 5

## 2022-12-19 MED ORDER — ALUM & MAG HYDROXIDE-SIMETH 200-200-20 MG/5ML PO SUSP: 30.0000 mL | ORAL | Status: DC | PRN

## 2022-12-19 MED ORDER — OXYCODONE HCL 5 MG PO TABS: 5.0000 mg | ORAL_TABLET | ORAL | Status: DC | PRN

## 2022-12-19 MED ORDER — PRAVASTATIN SODIUM 20 MG PO TABS
40.0000 mg | ORAL_TABLET | Freq: Every day | ORAL | Status: DC
  Administered 2022-12-19: 40 mg via ORAL
  Filled 2022-12-19 (×2): qty 2

## 2022-12-19 MED ORDER — MIDAZOLAM HCL 2 MG/2ML IJ SOLN: 0.5000 mg | Freq: Once | INTRAMUSCULAR | Status: DC | PRN

## 2022-12-19 MED ORDER — OXYCODONE HCL 5 MG PO TABS
ORAL_TABLET | ORAL | Status: AC
  Filled 2022-12-19: qty 1

## 2022-12-19 MED ORDER — POVIDONE-IODINE 10 % EX SWAB: 2.0000 | Freq: Once | CUTANEOUS | Status: DC

## 2022-12-19 MED ORDER — BUPIVACAINE LIPOSOME 1.3 % IJ SUSP
INTRAMUSCULAR | Status: AC
  Filled 2022-12-19: qty 10

## 2022-12-19 MED ORDER — ONDANSETRON HCL 4 MG/2ML IJ SOLN: 4.0000 mg | Freq: Four times a day (QID) | INTRAMUSCULAR | Status: DC | PRN

## 2022-12-19 MED ORDER — CHLORHEXIDINE GLUCONATE 0.12 % MT SOLN
15.0000 mL | Freq: Once | OROMUCOSAL | Status: AC
  Administered 2022-12-19: 15 mL via OROMUCOSAL

## 2022-12-19 MED ORDER — ASPIRIN 325 MG PO TBEC: 325.0000 mg | DELAYED_RELEASE_TABLET | Freq: Two times a day (BID) | ORAL | 0 refills | Status: AC

## 2022-12-19 MED ORDER — FENTANYL CITRATE PF 50 MCG/ML IJ SOSY
PREFILLED_SYRINGE | INTRAMUSCULAR | Status: AC
  Administered 2022-12-19: 50 ug via INTRAVENOUS
  Filled 2022-12-19: qty 1

## 2022-12-19 MED ORDER — HYDROMORPHONE HCL 1 MG/ML IJ SOLN
0.5000 mg | INTRAMUSCULAR | Status: DC | PRN
  Administered 2022-12-19 (×3): 0.5 mg via INTRAVENOUS
  Filled 2022-12-19 (×3): qty 0.5

## 2022-12-19 MED ORDER — CEFAZOLIN SODIUM-DEXTROSE 2-4 GM/100ML-% IV SOLN
2.0000 g | INTRAVENOUS | Status: AC
  Administered 2022-12-19: 2 g via INTRAVENOUS
  Filled 2022-12-19: qty 100

## 2022-12-19 MED ORDER — BUPIVACAINE-EPINEPHRINE 0.25% -1:200000 IJ SOLN
INTRAMUSCULAR | Status: DC | PRN
  Administered 2022-12-19: 30 mL

## 2022-12-19 MED ORDER — ORAL CARE MOUTH RINSE: 15.0000 mL | Freq: Once | OROMUCOSAL | Status: AC

## 2022-12-19 MED ORDER — MEPERIDINE HCL 50 MG/ML IJ SOLN: 6.2500 mg | INTRAMUSCULAR | Status: DC | PRN

## 2022-12-19 MED ORDER — ZOLPIDEM TARTRATE 5 MG PO TABS: 5.0000 mg | ORAL_TABLET | Freq: Every evening | ORAL | Status: DC | PRN

## 2022-12-19 MED ORDER — METHOCARBAMOL 1000 MG/10ML IJ SOLN: 500.0000 mg | Freq: Four times a day (QID) | INTRAVENOUS | Status: DC | PRN

## 2022-12-19 MED ORDER — TRAZODONE HCL 100 MG PO TABS
100.0000 mg | ORAL_TABLET | Freq: Every day | ORAL | Status: DC
  Administered 2022-12-19: 100 mg via ORAL
  Filled 2022-12-19: qty 1

## 2022-12-19 MED ORDER — BUPIVACAINE HCL (PF) 0.25 % IJ SOLN
INTRAMUSCULAR | Status: AC
  Filled 2022-12-19: qty 30

## 2022-12-19 MED ORDER — DEXAMETHASONE SODIUM PHOSPHATE 10 MG/ML IJ SOLN
10.0000 mg | Freq: Two times a day (BID) | INTRAMUSCULAR | Status: AC
  Administered 2022-12-19 – 2022-12-20 (×2): 10 mg via INTRAVENOUS
  Filled 2022-12-19 (×2): qty 1

## 2022-12-19 MED ORDER — POLYVINYL ALCOHOL 1.4 % OP SOLN
1.0000 [drp] | Freq: Two times a day (BID) | OPHTHALMIC | Status: DC
  Administered 2022-12-19 – 2022-12-20 (×2): 1 [drp] via OPHTHALMIC
  Filled 2022-12-19: qty 15

## 2022-12-19 MED ORDER — OXYCODONE HCL 5 MG PO TABS
5.0000 mg | ORAL_TABLET | Freq: Four times a day (QID) | ORAL | Status: DC | PRN
  Filled 2022-12-19: qty 1

## 2022-12-19 MED ORDER — SUMATRIPTAN SUCCINATE 50 MG PO TABS: 100.0000 mg | ORAL_TABLET | ORAL | Status: DC | PRN

## 2022-12-19 MED ORDER — BUPIVACAINE LIPOSOME 1.3 % IJ SUSP: 10.0000 mL | Freq: Once | INTRAMUSCULAR | Status: DC

## 2022-12-19 MED ORDER — FENTANYL CITRATE (PF) 100 MCG/2ML IJ SOLN
INTRAMUSCULAR | Status: AC
  Filled 2022-12-19: qty 2

## 2022-12-19 MED ORDER — TRANEXAMIC ACID-NACL 1000-0.7 MG/100ML-% IV SOLN
1000.0000 mg | INTRAVENOUS | Status: AC
  Administered 2022-12-19: 1000 mg via INTRAVENOUS
  Filled 2022-12-19: qty 100

## 2022-12-19 MED ORDER — EPINEPHRINE PF 1 MG/ML IJ SOLN
INTRAMUSCULAR | Status: AC
  Filled 2022-12-19: qty 1

## 2022-12-19 MED ORDER — ONDANSETRON HCL 4 MG PO TABS: 4.0000 mg | ORAL_TABLET | Freq: Four times a day (QID) | ORAL | Status: DC | PRN

## 2022-12-19 MED ORDER — PROPOFOL 10 MG/ML IV BOLUS
INTRAVENOUS | Status: AC
  Filled 2022-12-19: qty 20

## 2022-12-19 MED ORDER — OXYCODONE HCL 5 MG PO TABS
5.0000 mg | ORAL_TABLET | Freq: Once | ORAL | Status: AC | PRN
  Administered 2022-12-19: 5 mg via ORAL

## 2022-12-19 MED ORDER — OXYCODONE-ACETAMINOPHEN 5-325 MG PO TABS: 1.0000 | ORAL_TABLET | Freq: Four times a day (QID) | ORAL | 0 refills | Status: AC | PRN

## 2022-12-19 MED ORDER — POLYETHYLENE GLYCOL 3350 17 G PO PACK: 17.0000 g | PACK | Freq: Every day | ORAL | Status: DC | PRN

## 2022-12-19 MED ORDER — MIDAZOLAM HCL 5 MG/5ML IJ SOLN
INTRAMUSCULAR | Status: DC | PRN
  Administered 2022-12-19: 2 mg via INTRAVENOUS

## 2022-12-19 MED ORDER — LACTATED RINGERS IV SOLN: INTRAVENOUS | Status: DC

## 2022-12-19 MED ORDER — FENTANYL CITRATE PF 50 MCG/ML IJ SOSY: 50.0000 ug | PREFILLED_SYRINGE | Freq: Once | INTRAMUSCULAR | Status: AC

## 2022-12-19 MED ORDER — POLYETHYL GLYCOL-PROPYL GLYCOL 0.4-0.3 % OP GEL: Freq: Two times a day (BID) | OPHTHALMIC | Status: DC

## 2022-12-19 MED ORDER — DEXAMETHASONE SODIUM PHOSPHATE 4 MG/ML IJ SOLN
INTRAMUSCULAR | Status: DC | PRN
  Administered 2022-12-19: 4 mg via INTRAVENOUS

## 2022-12-19 MED ORDER — ASPIRIN 325 MG PO TBEC
325.0000 mg | DELAYED_RELEASE_TABLET | Freq: Two times a day (BID) | ORAL | Status: DC
  Administered 2022-12-19 – 2022-12-20 (×2): 325 mg via ORAL
  Filled 2022-12-19 (×2): qty 1

## 2022-12-19 MED ORDER — DOCUSATE SODIUM 100 MG PO CAPS: 100.0000 mg | ORAL_CAPSULE | Freq: Two times a day (BID) | ORAL | 0 refills | Status: AC

## 2022-12-19 MED ORDER — PREGABALIN 75 MG PO CAPS
75.0000 mg | ORAL_CAPSULE | Freq: Every day | ORAL | Status: DC
  Administered 2022-12-19: 75 mg via ORAL
  Filled 2022-12-19 (×2): qty 1

## 2022-12-19 MED ORDER — TOPIRAMATE 25 MG PO TABS
50.0000 mg | ORAL_TABLET | Freq: Two times a day (BID) | ORAL | Status: DC
  Administered 2022-12-19 – 2022-12-20 (×2): 50 mg via ORAL
  Filled 2022-12-19 (×2): qty 2

## 2022-12-19 MED ORDER — TIZANIDINE HCL 2 MG PO TABS: 2.0000 mg | ORAL_TABLET | Freq: Three times a day (TID) | ORAL | 0 refills | Status: AC | PRN

## 2022-12-19 MED ORDER — METHOCARBAMOL 500 MG PO TABS
500.0000 mg | ORAL_TABLET | Freq: Four times a day (QID) | ORAL | Status: DC | PRN
  Administered 2022-12-19 – 2022-12-20 (×3): 500 mg via ORAL
  Filled 2022-12-19 (×3): qty 1

## 2022-12-19 MED ORDER — ACETAMINOPHEN 325 MG PO TABS: 325.0000 mg | ORAL_TABLET | Freq: Four times a day (QID) | ORAL | Status: DC | PRN

## 2022-12-19 MED ORDER — ACETAMINOPHEN 500 MG PO TABS
1000.0000 mg | ORAL_TABLET | Freq: Once | ORAL | Status: AC
  Administered 2022-12-19: 1000 mg via ORAL
  Filled 2022-12-19: qty 2

## 2022-12-19 MED ORDER — DOCUSATE SODIUM 100 MG PO CAPS
100.0000 mg | ORAL_CAPSULE | Freq: Two times a day (BID) | ORAL | Status: DC
  Filled 2022-12-19 (×2): qty 1

## 2022-12-19 MED ORDER — PROPOFOL 500 MG/50ML IV EMUL
INTRAVENOUS | Status: DC | PRN
  Administered 2022-12-19: 100 ug/kg/min via INTRAVENOUS

## 2022-12-19 MED ORDER — CELECOXIB 200 MG PO CAPS
200.0000 mg | ORAL_CAPSULE | Freq: Every day | ORAL | Status: DC
  Administered 2022-12-19 – 2022-12-20 (×2): 200 mg via ORAL
  Filled 2022-12-19 (×3): qty 1

## 2022-12-19 MED ORDER — PHENYLEPHRINE 80 MCG/ML (10ML) SYRINGE FOR IV PUSH (FOR BLOOD PRESSURE SUPPORT)
PREFILLED_SYRINGE | INTRAVENOUS | Status: AC
  Filled 2022-12-19: qty 20

## 2022-12-19 MED ORDER — BUPIVACAINE IN DEXTROSE 0.75-8.25 % IT SOLN
INTRATHECAL | Status: DC | PRN
  Administered 2022-12-19: 1.8 mL via INTRATHECAL

## 2022-12-19 MED ORDER — ACETAMINOPHEN 325 MG PO TABS: 650.0000 mg | ORAL_TABLET | Freq: Four times a day (QID) | ORAL | Status: DC | PRN

## 2022-12-19 MED ORDER — CHLORHEXIDINE GLUCONATE 0.12 % MT SOLN
15.0000 mL | Freq: Two times a day (BID) | OROMUCOSAL | Status: DC
  Filled 2022-12-19 (×2): qty 15

## 2022-12-19 MED ORDER — MIDAZOLAM HCL 2 MG/2ML IJ SOLN
INTRAMUSCULAR | Status: AC
  Filled 2022-12-19: qty 2

## 2022-12-19 MED ORDER — 0.9 % SODIUM CHLORIDE (POUR BTL) OPTIME
TOPICAL | Status: DC | PRN
  Administered 2022-12-19: 1000 mL

## 2022-12-19 MED ORDER — OXYCODONE HCL 5 MG/5ML PO SOLN: 5.0000 mg | Freq: Once | ORAL | Status: AC | PRN

## 2022-12-19 MED ORDER — BUPIVACAINE LIPOSOME 1.3 % IJ SUSP
INTRAMUSCULAR | Status: DC | PRN
  Administered 2022-12-19: 10 mL

## 2022-12-19 MED ORDER — DIPHENHYDRAMINE HCL 12.5 MG/5ML PO ELIX: 12.5000 mg | ORAL_SOLUTION | ORAL | Status: DC | PRN

## 2022-12-19 MED ORDER — PROMETHAZINE HCL 25 MG/ML IJ SOLN: 6.2500 mg | INTRAMUSCULAR | Status: DC | PRN

## 2022-12-19 MED ORDER — TRANEXAMIC ACID-NACL 1000-0.7 MG/100ML-% IV SOLN
1000.0000 mg | Freq: Once | INTRAVENOUS | Status: AC
  Administered 2022-12-19: 1000 mg via INTRAVENOUS
  Filled 2022-12-19: qty 100

## 2022-12-19 MED ORDER — TRANEXAMIC ACID 1000 MG/10ML IV SOLN
2000.0000 mg | Freq: Once | INTRAVENOUS | Status: DC
  Filled 2022-12-19: qty 20

## 2022-12-19 MED ORDER — ACETAMINOPHEN 650 MG RE SUPP: 650.0000 mg | Freq: Four times a day (QID) | RECTAL | Status: DC | PRN

## 2022-12-19 MED ORDER — ALBUMIN HUMAN 5 % IV SOLN: INTRAVENOUS | Status: DC | PRN

## 2022-12-19 MED ORDER — HYDROMORPHONE HCL 1 MG/ML IJ SOLN
INTRAMUSCULAR | Status: AC
  Administered 2022-12-19: 0.5 mg via INTRAVENOUS
  Filled 2022-12-19: qty 2

## 2022-12-19 MED ORDER — KETAMINE HCL 10 MG/ML IJ SOLN
INTRAMUSCULAR | Status: DC | PRN
  Administered 2022-12-19: 20 mg via INTRAVENOUS

## 2022-12-19 SURGICAL SUPPLY — 46 items
APL SKNCLS STERI-STRIP NONHPOA (GAUZE/BANDAGES/DRESSINGS) ×1
BAG COUNTER SPONGE SURGICOUNT (BAG) IMPLANT
BAG SPEC THK2 15X12 ZIP CLS (MISCELLANEOUS)
BAG SPNG CNTER NS LX DISP (BAG) ×1
BAG ZIPLOCK 12X15 (MISCELLANEOUS) IMPLANT
BENZOIN TINCTURE PRP APPL 2/3 (GAUZE/BANDAGES/DRESSINGS) IMPLANT
BLADE SAW SGTL 18X1.27X75 (BLADE) ×2 IMPLANT
BLADE SURG SZ10 CARB STEEL (BLADE) ×4 IMPLANT
CLSR STERI-STRIP ANTIMIC 1/2X4 (GAUZE/BANDAGES/DRESSINGS) IMPLANT
COVER PERINEAL POST (MISCELLANEOUS) ×2 IMPLANT
COVER SURGICAL LIGHT HANDLE (MISCELLANEOUS) ×2 IMPLANT
CUP ACETBLR 48 OD 100 SERIES (Hips) IMPLANT
DRAPE FOOT SWITCH (DRAPES) ×2 IMPLANT
DRAPE STERI IOBAN 125X83 (DRAPES) ×2 IMPLANT
DRAPE U-SHAPE 47X51 STRL (DRAPES) ×4 IMPLANT
DRSG AQUACEL AG ADV 3.5X 6 (GAUZE/BANDAGES/DRESSINGS) ×2 IMPLANT
DURAPREP 26ML APPLICATOR (WOUND CARE) ×2 IMPLANT
ELECT REM PT RETURN 15FT ADLT (MISCELLANEOUS) ×2 IMPLANT
ELIMINATOR HOLE APEX DEPUY (Hips) IMPLANT
GAUZE XEROFORM 1X8 LF (GAUZE/BANDAGES/DRESSINGS) IMPLANT
GLOVE BIOGEL PI IND STRL 8 (GLOVE) ×4 IMPLANT
GLOVE ECLIPSE 7.5 STRL STRAW (GLOVE) ×4 IMPLANT
GOWN STRL REUS W/ TWL XL LVL3 (GOWN DISPOSABLE) ×4 IMPLANT
GOWN STRL REUS W/TWL XL LVL3 (GOWN DISPOSABLE) ×2
HEAD FEMORAL 32 CERAMIC (Hips) IMPLANT
HOLDER FOLEY CATH W/STRAP (MISCELLANEOUS) ×2 IMPLANT
HOOD PEEL AWAY T7 (MISCELLANEOUS) ×6 IMPLANT
KIT TURNOVER KIT A (KITS) IMPLANT
NDL HYPO 22X1.5 SAFETY MO (MISCELLANEOUS) ×2 IMPLANT
NEEDLE HYPO 22X1.5 SAFETY MO (MISCELLANEOUS) ×1 IMPLANT
PACK ANTERIOR HIP CUSTOM (KITS) ×2 IMPLANT
PINN ALTRX NEUT ID X OD 32X48 IMPLANT
SPIKE FLUID TRANSFER (MISCELLANEOUS) ×2 IMPLANT
STAPLER VISISTAT 35W (STAPLE) IMPLANT
STEM FEM SZ3 STD ACTIS (Stem) IMPLANT
STRIP CLOSURE SKIN 1/2X4 (GAUZE/BANDAGES/DRESSINGS) IMPLANT
SUT ETHIBOND NAB CT1 #1 30IN (SUTURE) ×4 IMPLANT
SUT MNCRL AB 3-0 PS2 18 (SUTURE) IMPLANT
SUT VIC AB 0 CT1 36 (SUTURE) ×2 IMPLANT
SUT VIC AB 1 CT1 36 (SUTURE) ×2 IMPLANT
SUT VIC AB 2-0 CT1 27 (SUTURE) ×1
SUT VIC AB 2-0 CT1 TAPERPNT 27 (SUTURE) ×2 IMPLANT
SUT VIC AB 3-0 CT1 27 (SUTURE)
SUT VIC AB 3-0 CT1 TAPERPNT 27 (SUTURE) IMPLANT
TRAY FOLEY MTR SLVR 16FR STAT (SET/KITS/TRAYS/PACK) ×2 IMPLANT
TUBE SUCTION HIGH CAP CLEAR NV (SUCTIONS) ×2 IMPLANT

## 2022-12-19 NOTE — Anesthesia Preprocedure Evaluation (Addendum)
Anesthesia Evaluation  Patient identified by MRN, date of birth, ID band Patient awake    Reviewed: Allergy & Precautions, NPO status , Patient's Chart, lab work & pertinent test results  History of Anesthesia Complications (+) PONV  Airway Mallampati: I  TM Distance: >3 FB Neck ROM: Full    Dental  (+) Dental Advisory Given   Pulmonary former smoker   breath sounds clear to auscultation       Cardiovascular negative cardio ROS  Rhythm:Regular Rate:Normal     Neuro/Psych  Headaches  Anxiety        GI/Hepatic negative GI ROS, Neg liver ROS,,,  Endo/Other  negative endocrine ROS    Renal/GU negative Renal ROS     Musculoskeletal  (+) Arthritis ,    Abdominal   Peds  Hematology Hb 12.3, plt 262k   Anesthesia Other Findings H/o cervical cancer  Reproductive/Obstetrics                             Anesthesia Physical Anesthesia Plan  ASA: 2  Anesthesia Plan: Spinal   Post-op Pain Management: Tylenol PO (pre-op)*   Induction:   PONV Risk Score and Plan: 3 and Ondansetron  Airway Management Planned: Simple Face Mask and Natural Airway  Additional Equipment: None  Intra-op Plan:   Post-operative Plan:   Informed Consent: I have reviewed the patients History and Physical, chart, labs and discussed the procedure including the risks, benefits and alternatives for the proposed anesthesia with the patient or authorized representative who has indicated his/her understanding and acceptance.     Dental advisory given  Plan Discussed with: CRNA and Surgeon  Anesthesia Plan Comments:         Anesthesia Quick Evaluation

## 2022-12-19 NOTE — Evaluation (Addendum)
Physical Therapy Evaluation Patient Details Name: [name] MRN: [id number] DOB: 1956/04/23 Today's Date: 12/19/2022  History of Present Illness  67 yo female presents to therapy s/p L THA, anterior approach on 12/19/2022 following L femoral neck fx due to mechanical fall. Pt PMH includes but is not limited to, cervical ca, HDL, R wrist sx, migraine, insomnia, B TKA and B revisions.  Clinical Impression    [name] is a 67 y.o. female POD 0 s/p L THA. Patient reports IND prior to fall and L femur fx on 6/13 with mobility at baseline. Patient is now limited by functional impairments (see PT problem list below) and requires min guard for bed mobility and min guard and cues for transfers. Patient was able to ambulate 35 feet with RW and min guard level of assist. Patient instructed in exercise to facilitate ROM and circulation to manage edema. Pt reported 6/10 L LE pain at rest and nursing provided pain medication during eval. Patient will benefit from continued skilled PT interventions to address impairments and progress towards PLOF. Acute PT will follow to progress mobility and stair training in preparation for safe discharge home with family support and [facility] services.      Recommendations for follow up therapy are one component of a multi-disciplinary discharge planning process, led by the attending physician.  Recommendations may be updated based on patient status, additional functional criteria and insurance authorization.  Follow Up Recommendations       Assistance Recommended at Discharge Intermittent Supervision/Assistance  Patient can return home with the following  A little help with walking and/or transfers;A little help with bathing/dressing/bathroom;Assistance with cooking/housework;Assist for transportation;Help with stairs or ramp for entrance    Equipment Recommendations Rolling walker (2 wheels)  Recommendations for Other Services       Functional Status  Assessment Patient has had a recent decline in their functional status and demonstrates the ability to make significant improvements in function in a reasonable and predictable amount of time.     Precautions / Restrictions Precautions Precautions: Fall Required Braces or Orthoses: Splint/Cast (B wrist) Restrictions Weight Bearing Restrictions: No      Mobility  Bed Mobility Overal bed mobility: Needs Assistance Bed Mobility: Supine to Sit     Supine to sit: Min guard, HOB elevated     General bed mobility comments: min cues    Transfers Overall transfer level: Needs assistance Equipment used: Rolling walker (2 wheels) Transfers: Sit to/from Stand Sit to Stand: Min guard, From elevated surface           General transfer comment: cues for proper UE placement, B wrist splints/braces donned for functional mobility and pt reports requiring for feeding    Ambulation/Gait Ambulation/Gait assistance: Min guard Gait Distance (Feet): 35 Feet Assistive device: Rolling walker (2 wheels) Gait Pattern/deviations: Step-to pattern, Antalgic Gait velocity: decreased     General Gait Details: cues for proper use of AD  Stairs            Wheelchair Mobility    Modified Rankin (Stroke Patients Only)       Balance Overall balance assessment: Needs assistance, History of Falls Sitting-balance support: Feet supported Sitting balance-Leahy Scale: Good     Standing balance support: Bilateral upper extremity supported, Reliant on assistive device for balance, During functional activity Standing balance-Leahy Scale: Poor  Pertinent Vitals/Pain Pain Assessment Pain Assessment: 0-10 Pain Score: 6  Pain Location: L hip Pain Descriptors / Indicators: Aching, Constant, Discomfort, Operative site guarding Pain Intervention(s): Limited activity within patient's tolerance, Monitored during session, Premedicated before session,  Repositioned, Ice applied, Patient requesting pain meds-RN notified    Home Living Family/patient expects to be discharged to:: Private residence Living Arrangements: Spouse/significant other Available Help at Discharge: Family Type of Home: House Home Access: Stairs to enter Entrance Stairs-Rails: Right Entrance Stairs-Number of Steps: 5   Home Layout: One level Home Equipment: Agricultural consultant (2 wheels) (pt reports RW is not in good condition)      Prior Function Prior Level of Function : Independent/Modified Independent             Mobility Comments: IND prior to fall and L femur fx       Hand Dominance        Extremity/Trunk Assessment        Lower Extremity Assessment Lower Extremity Assessment: LLE deficits/detail LLE Deficits / Details: ankle DF/PF 5/5 LLE Sensation: decreased light touch (buttock and groin region)    Cervical / Trunk Assessment Cervical / Trunk Assessment:  (wff)  Communication   Communication: No difficulties  Cognition Arousal/Alertness: Awake/alert Behavior During Therapy: WFL for tasks assessed/performed Overall Cognitive Status: Within Functional Limits for tasks assessed                                          General Comments      Exercises Total Joint Exercises Ankle Circles/Pumps: AROM, Both, 20 reps   Assessment/Plan    PT Assessment Patient needs continued PT services  PT Problem List Decreased strength;Decreased range of motion;Decreased activity tolerance;Decreased balance;Decreased mobility;Decreased coordination;Pain       PT Treatment Interventions DME instruction;Gait training;Stair training;Functional mobility training;Therapeutic activities;Therapeutic exercise;Balance training;Neuromuscular re-education;Patient/family education;Modalities    PT Goals (Current goals can be found in the Care Plan section)  Acute Rehab PT Goals Patient Stated Goal: to get back to doing things like I was  before PT Goal Formulation: With patient Time For Goal Achievement: 01/02/23 Potential to Achieve Goals: Good    Frequency 7X/week     Co-evaluation               AM-PAC PT "6 Clicks" Mobility  Outcome Measure Help needed turning from your back to your side while in a flat bed without using bedrails?: A Little Help needed moving from lying on your back to sitting on the side of a flat bed without using bedrails?: A Little Help needed moving to and from a bed to a chair (including a wheelchair)?: A Little Help needed standing up from a chair using your arms (e.g., wheelchair or bedside chair)?: A Little Help needed to walk in hospital room?: A Little Help needed climbing 3-5 steps with a railing? : A Lot 6 Click Score: 17    End of Session Equipment Utilized During Treatment: Gait belt (B UE splint) Activity Tolerance: Patient tolerated treatment well Patient left: in chair;with call bell/phone within reach;with chair alarm set;with family/visitor present Nurse Communication: Mobility status;Patient requests pain meds PT Visit Diagnosis: Unsteadiness on feet (R26.81);Other abnormalities of gait and mobility (R26.89);Muscle weakness (generalized) (M62.81);History of falling (Z91.81);Difficulty in walking, not elsewhere classified (R26.2);Pain Pain - Right/Left: Left Pain - part of body: Hip;Leg    Time: 6578-4696 PT Time Calculation (min) (ACUTE  ONLY): 32 min   Charges:   PT Evaluation $PT Eval Low Complexity: 1 Low PT Treatments $Gait Training: 8-22 mins        Johnny Bridge, PT Acute Rehab   Jacqualyn Posey 12/19/2022, 5:15 PM

## 2022-12-19 NOTE — Anesthesia Postprocedure Evaluation (Signed)
Anesthesia Post Note  Patient: Diamantina Melillo  Procedure(s) Performed: TOTAL HIP ARTHROPLASTY ANTERIOR APPROACH (Left: Hip)     Patient location during evaluation: PACU Anesthesia Type: Spinal Level of consciousness: awake, awake and alert and oriented Pain management: pain level controlled Vital Signs Assessment: post-procedure vital signs reviewed and stable Respiratory status: spontaneous breathing, nonlabored ventilation and respiratory function stable Cardiovascular status: blood pressure returned to baseline and stable Postop Assessment: no headache, no backache, spinal receding and no apparent nausea or vomiting Anesthetic complications: no   No notable events documented.  Last Vitals:  Vitals:   12/19/22 1839 12/19/22 2024  BP: (!) 105/58 (!) 92/52  Pulse: 73 64  Resp: 17 18  Temp: 36.6 C 36.9 C  SpO2: 98% 97%    Last Pain:  Vitals:   12/19/22 1951  TempSrc:   PainSc: 7                  Collene Schlichter

## 2022-12-19 NOTE — H&P (Signed)
Consult note  Chief Complaint: Left hip pain   HPI: [name] is a 67 y.o. female who presents for evaluation of left hip pai with femoral neck fracture by plain Xray. It has been present for 5 days and has been worsening. She has failed conservative measures. Patient is having difficulty with activities of daily living and unable to stand walk or sleep. Pt is an active 67 yo female.  Pain is rated as severe.  Past Medical History:  Diagnosis Date   Arthritis    Cancer (HCC) 07/03/1976   cervical cancer   Carpal tunnel syndrome    Right   Headache(784.0)    History of migraine    last one was on 06/02/12;takes Topamax bid and Imitrex prn   Hyperlipidemia    takes Simvastatin nightly   Insomnia    takes Trazodone nightly   Joint pain    Joint swelling    PONV (postoperative nausea and vomiting)    Seasonal allergies    in spring   Past Surgical History:  Procedure Laterality Date   ABDOMINAL HYSTERECTOMY  1978   APPENDECTOMY  2008   ARTROPLASTY     bladder tacked  1999   BREAST EXCISIONAL BIOPSY Left 2009   BREAST SURGERY  2009   cyst removed from left breast   COLONOSCOPY     cyst removed from right knee   1972   KNEE CLOSED REDUCTION Right 06/07/2015   Procedure: CLOSED MANIPULATION RIGHT KNEE;  Surgeon: Jodi Geralds, MD;  Location: [facility];  Service: Orthopedics;  Laterality: Right;   left knee surgery  1973   left knee surgery  2012   right carpal tunnel     right knee arthroscopy  90's    x 3   right knee partial replacement  1991   right wrist sugery  2008   scar tissue removed  2008   TOTAL KNEE ARTHROPLASTY  06/12/2012   Procedure: TOTAL KNEE ARTHROPLASTY;  Surgeon: Loreta Ave, MD;  Location: [facility] OR;  Service: Orthopedics;  Laterality: Left;   TOTAL KNEE ARTHROPLASTY Right 04/05/2015   TOTAL KNEE REVISION Left 03/26/2013   Procedure: TOTAL KNEE REVISION- left;  Surgeon: Harvie Junior, MD;  Location: [facility] OR;  Service:  Orthopedics;  Laterality: Left;   TOTAL KNEE REVISION Right 04/05/2015   Procedure: TOTAL KNEE REVISION;  Surgeon: Jodi Geralds, MD;  Location: [facility] OR;  Service: Orthopedics;  Laterality: Right;   Social History   Socioeconomic History   Marital status: Married    Spouse name: Not on file   Number of children: Not on file   Years of education: Not on file   Highest education level: Not on file  Occupational History   Not on file  Tobacco Use   Smoking status: Former    Types: Cigarettes    Quit date: 06/12/2000    Years since quitting: 22.5   Smokeless tobacco: Never   Tobacco comments:    quit 10+yrs ago  Vaping Use   Vaping Use: Never used  Substance and Sexual Activity   Alcohol use: No   Drug use: No   Sexual activity: Yes    Birth control/protection: Surgical  Other Topics Concern   Not on file  Social History Narrative   Not on file   Social Determinants of Health   Financial Resource Strain: Not on file  Food Insecurity: Not on file  Transportation Needs: Not on file  Physical Activity: Not on  file  Stress: Not on file  Social Connections: Not on file   Family History  Problem Relation Age of Onset   Breast cancer Neg Hx    Allergies  Allergen Reactions   Mupirocin Other (See Comments)    Bumps on tongue and thickness to tongue    Prior to Admission medications   Medication Sig Start Date End Date Taking? Authorizing Provider  aspirin EC 325 MG tablet Take 1 tablet (325 mg total) by mouth 2 (two) times daily after a meal. Take x 1 month post op to decrease risk of blood clots. Patient taking differently: Take 325 mg by mouth at bedtime. 04/05/15  Yes Marshia Ly, PA-C  celecoxib (CELEBREX) 200 MG capsule Take 200 mg by mouth daily.   Yes [provider]  Cholecalciferol (VITAMIN D-3) 125 MCG (5000 UT) TABS Take 5,000 Units by mouth daily.   Yes [provider]  ciclopirox (PENLAC) 8 % solution Apply 1 Application topically at bedtime.  09/19/22  Yes [provider]  estradiol (ESTRACE) 0.1 MG/GM vaginal cream Place 1 Applicatorful vaginally 3 (three) times a week.   Yes [provider]  LYRICA 75 MG capsule Take 75 mg by mouth daily. 03/10/14  Yes [provider]  Polyethyl Glycol-Propyl Glycol (SYSTANE OP) Place 1 drop into both eyes in the morning and at bedtime.   Yes [provider]  pravastatin (PRAVACHOL) 40 MG tablet Take 40 mg by mouth daily.   Yes [provider]  SUMAtriptan (IMITREX) 100 MG tablet Take 100 mg by mouth every 2 (two) hours as needed for migraine.    Yes [provider]  topiramate (TOPAMAX) 50 MG tablet Take 50 mg by mouth 2 (two) times daily.   Yes [provider]  traMADol (ULTRAM) 50 MG tablet Take 50 mg by mouth as needed for moderate pain or severe pain. 03/24/14  Yes [provider]  traZODone (DESYREL) 100 MG tablet Take 100 mg by mouth at bedtime.   Yes [provider]  valACYclovir (VALTREX) 1000 MG tablet Take 1,000 mg by mouth daily.   Yes [provider]     Positive ROS: none  All other systems have been reviewed and were otherwise negative with the exception of those mentioned in the HPI and as above.  Physical Exam: There were no vitals filed for this visit.  General: Alert, no acute distress Cardiovascular: No pedal edema Respiratory: No cyanosis, no use of accessory musculature GI: No organomegaly, abdomen is soft and non-tender Skin: No lesions in the area of chief complaint Neurologic: Sensation intact distally Psychiatric: Patient is competent for consent with normal mood and affect Lymphatic: No axillary or cervical lymphadenopathy  MUSCULOSKELETAL: left hip: pain with all rom. Limited rom. +heel strike  Xray: impacted femoral neck fracture with slight posterior angulation.   Assessment/Plan: LEFT HIP FEMORAL NECK FRACTURE in a patient who is very active and very osteoporotic.  Pt  will be admitted and have pre-operative evaluation per hospitalists. Plan for Procedure(s): TOTAL HIP ARTHROPLASTY ANTERIOR APPROACH  The risks benefits and alternatives were discussed with the patient including but not limited to the risks of nonoperative treatment, versus surgical intervention including infection, bleeding, nerve injury, malunion, nonunion, hardware prominence, hardware failure, need for hardware removal, blood clots, cardiopulmonary complications, morbidity, mortality, among others, and they were willing to proceed.  Predicted outcome is good, although there will be at least a six to nine month expected recovery.  Harvie Junior, MD 12/19/2022  6:46 AM

## 2022-12-19 NOTE — Transfer of Care (Signed)
Immediate Anesthesia Transfer of Care Note  Patient: [name]  Procedure(s) Performed: TOTAL HIP ARTHROPLASTY ANTERIOR APPROACH (Left: Hip)  Patient Location: PACU  Anesthesia Type:Spinal  Level of Consciousness: awake, drowsy, and patient cooperative  Airway & Oxygen Therapy: Patient Spontanous Breathing and Patient connected to face mask oxygen  Post-op Assessment: Report given to RN and Post -op Vital signs reviewed and stable  Post vital signs: Reviewed and stable  Last Vitals:  Vitals Value Taken Time  BP 97/61 12/19/22 1339  Temp    Pulse 62 12/19/22 1342  Resp 11 12/19/22 1342  SpO2 100 % 12/19/22 1342  Vitals shown include unvalidated device data.  Last Pain:  Vitals:   12/19/22 1047  TempSrc:   PainSc: 6       Patients Stated Pain Goal: 3 (12/18/22 1257)  Complications: No notable events documented.

## 2022-12-19 NOTE — H&P (Signed)
History and Physical    Patient: [name] VWU:[id number] DOB: 1956/01/11 DOA: 12/19/2022 DOS: the patient was seen and examined on 12/19/2022 PCP: Elfredia Nevins, MD  Patient coming from: Home  Chief Complaint: Left hip fracture.  HPI: [name] is a 67 y.o. female with medical history significant of osteoarthritis, cervical cancer, colon syndrome, migraine headaches, hyperlipidemia, insomnia, joint pain, joint swelling, seasonal allergies fell at home on Thursday developing left hip pain worsened by weight bearing who was seen by orthopedic surgery yesterday at the office, diagnosed with left hip femoral neck fracture and set up for left total hip arthroplasty today with Dr. Luiz Blare.  She denied fever, chills, rhinorrhea, sore throat, wheezing or hemoptysis.  No chest pain, palpitations, diaphoresis, PND, orthopnea or pitting edema of the lower extremities.  No abdominal pain, nausea, emesis, diarrhea, constipation, melena or hematochezia.  No flank pain, dysuria, frequency or hematuria.  No polyuria, polydipsia, polyphagia or blurred vision.   Short stay course: Initial vital signs were temperature 98.2 F, pulse 72, respirations 16, BP 107/63 mmHg O2 sat 98% on room air.  She received fentanyl 50 mcg IVP, acetaminophen 1000 mg p.o., cefazolin 2 g IVP and chlorhexidine rinse.  Lab work: CBC, magnesium, phosphorus and BMP were normal after calcium correction.  LFTs were normal except for a total protein of 6.4 g/dL MRSA PCR was positive for staph (allergy to mupirocin periotic solution was used).  Imaging: Outside imaging from orthopedic surgery office with left hip fracture.  No report available.   Review of Systems: As mentioned in the history of present illness. All other systems reviewed and are negative.  Past Medical History:  Diagnosis Date   Arthritis    Cancer (HCC) 07/03/1976   cervical cancer   Carpal tunnel syndrome    Right   Headache(784.0)     History of migraine    last one was on 06/02/12;takes Topamax bid and Imitrex prn   Hyperlipidemia    takes Simvastatin nightly   Insomnia    takes Trazodone nightly   Joint pain    Joint swelling    PONV (postoperative nausea and vomiting)    Seasonal allergies    in spring   Past Surgical History:  Procedure Laterality Date   ABDOMINAL HYSTERECTOMY  1978   APPENDECTOMY  2008   ARTROPLASTY     bladder tacked  1999   BREAST EXCISIONAL BIOPSY Left 2009   BREAST SURGERY  2009   cyst removed from left breast   COLONOSCOPY     cyst removed from right knee   1972   KNEE CLOSED REDUCTION Right 06/07/2015   Procedure: CLOSED MANIPULATION RIGHT KNEE;  Surgeon: Jodi Geralds, MD;  Location: [facility];  Service: Orthopedics;  Laterality: Right;   left knee surgery  1973   left knee surgery  2012   right carpal tunnel     right knee arthroscopy  90's    x 3   right knee partial replacement  1991   right wrist sugery  2008   scar tissue removed  2008   TOTAL KNEE ARTHROPLASTY  06/12/2012   Procedure: TOTAL KNEE ARTHROPLASTY;  Surgeon: Loreta Ave, MD;  Location: [facility] OR;  Service: Orthopedics;  Laterality: Left;   TOTAL KNEE ARTHROPLASTY Right 04/05/2015   TOTAL KNEE REVISION Left 03/26/2013   Procedure: TOTAL KNEE REVISION- left;  Surgeon: Harvie Junior, MD;  Location: [facility] OR;  Service: Orthopedics;  Laterality: Left;   TOTAL KNEE  REVISION Right 04/05/2015   Procedure: TOTAL KNEE REVISION;  Surgeon: Jodi Geralds, MD;  Location: [facility] OR;  Service: Orthopedics;  Laterality: Right;   Social History:  reports that she quit smoking about 22 years ago. Her smoking use included cigarettes. She has never used smokeless tobacco. She reports that she does not drink alcohol and does not use drugs.  Allergies  Allergen Reactions   Mupirocin Other (See Comments)    Bumps on tongue and thickness to tongue     Family History  Problem Relation Age of Onset   Breast cancer Neg Hx      Prior to Admission medications   Medication Sig Start Date End Date Taking? Authorizing Provider  aspirin EC 325 MG tablet Take 1 tablet (325 mg total) by mouth 2 (two) times daily after a meal. Take x 1 month post op to decrease risk of blood clots. Patient taking differently: Take 325 mg by mouth at bedtime. 04/05/15  Yes Marshia Ly, PA-C  celecoxib (CELEBREX) 200 MG capsule Take 200 mg by mouth daily.   Yes [provider]  Cholecalciferol (VITAMIN D-3) 125 MCG (5000 UT) TABS Take 5,000 Units by mouth daily.   Yes [provider]  ciclopirox (PENLAC) 8 % solution Apply 1 Application topically at bedtime. 09/19/22  Yes [provider]  estradiol (ESTRACE) 0.1 MG/GM vaginal cream Place 1 Applicatorful vaginally 3 (three) times a week.   Yes [provider]  LYRICA 75 MG capsule Take 75 mg by mouth daily. 03/10/14  Yes [provider]  Polyethyl Glycol-Propyl Glycol (SYSTANE OP) Place 1 drop into both eyes in the morning and at bedtime.   Yes [provider]  pravastatin (PRAVACHOL) 40 MG tablet Take 40 mg by mouth daily.   Yes [provider]  SUMAtriptan (IMITREX) 100 MG tablet Take 100 mg by mouth every 2 (two) hours as needed for migraine.    Yes [provider]  topiramate (TOPAMAX) 50 MG tablet Take 50 mg by mouth 2 (two) times daily.   Yes [provider]  traMADol (ULTRAM) 50 MG tablet Take 50 mg by mouth as needed for moderate pain or severe pain. 03/24/14  Yes [provider]  traZODone (DESYREL) 100 MG tablet Take 100 mg by mouth at bedtime.   Yes [provider]  valACYclovir (VALTREX) 1000 MG tablet Take 1,000 mg by mouth daily.   Yes [provider]    Physical Exam: Vitals:   12/18/22 1257 12/19/22 0920  BP:  107/63  Pulse:  72  Resp:  16  Temp:  98.2 F (36.8 C)  TempSrc:  Oral  SpO2:  98%  Weight: 54.4 kg   Height: 5\' 6"  (1.676 m)    Physical Exam Vitals  and nursing note reviewed.  Constitutional:      General: She is awake. She is not in acute distress.    Appearance: Normal appearance. She is normal weight.  HENT:     Head: Normocephalic.     Nose: No rhinorrhea.     Mouth/Throat:     Mouth: Mucous membranes are moist.  Eyes:     General: No scleral icterus.    Pupils: Pupils are equal, round, and reactive to light.  Neck:     Vascular: No JVD.  Cardiovascular:     Rate and Rhythm: Normal rate and regular rhythm.     Heart sounds: S1 normal and S2 normal.  Pulmonary:     Effort: Pulmonary effort is  normal.     Breath sounds: Normal breath sounds.  Abdominal:     General: Bowel sounds are normal. There is no distension.     Palpations: Abdomen is soft.     Tenderness: There is no abdominal tenderness. There is no guarding.  Musculoskeletal:     Cervical back: Neck supple.     Left hip: Tenderness present. Decreased range of motion.     Right lower leg: No edema.     Left lower leg: No edema.     Comments: Shortened LLE.  Good pulses.  Skin:    General: Skin is warm and dry.  Neurological:     General: No focal deficit present.     Mental Status: She is alert and oriented to person, place, and time.  Psychiatric:        Mood and Affect: Mood normal.        Behavior: Behavior normal. Behavior is cooperative.   Data Reviewed:  There are no new results to review at this time.  Assessment and Plan: Principal Problem:   Closed left hip fracture, initial encounter (HCC) Admit to telemetry/inpatient. Ice area as needed. Buck's traction per protocol. Analgesics as needed. Antiemetics as needed. Consult TOC team. Consult nutritional services. PT evaluation after surgery. Orthopedic surgery is following.  Active Problems:   History of migraine headaches Continue sumatriptan 50 mg p.o. every 2 hours times daily as needed.    Hyperlipidemia Continue pravastatin 40 mg p.o. daily.    Insomnia Continue trazodone 100  mg p.o. bedtime.    MRSA carrier Allergic to mupirocin. Anesthesia used Peridex preintubation.    Abnormal EKG  No cardiac history. No angina or dyspnea. Has multiple CVD risks. Will check echocardiogram.   Advance Care Planning:   Code Status: Full Code   Consults:   Family Communication:   Severity of Illness: The appropriate patient status for this patient is INPATIENT. Inpatient status is judged to be reasonable and necessary in order to provide the required intensity of service to ensure the patient's safety. The patient's presenting symptoms, physical exam findings, and initial radiographic and laboratory data in the context of their chronic comorbidities is felt to place them at high risk for further clinical deterioration. Furthermore, it is not anticipated that the patient will be medically stable for discharge from the hospital within 2 midnights of admission.   * I certify that at the point of admission it is my clinical judgment that the patient will require inpatient hospital care spanning beyond 2 midnights from the point of admission due to high intensity of service, high risk for further deterioration and high frequency of surveillance required.*  Author: Bobette Mo, MD 12/19/2022 9:28 AM  For on call review [email or web address].   This document was prepared using Dragon voice recognition software and may contain some unintended transcription errors.

## 2022-12-19 NOTE — Anesthesia Procedure Notes (Addendum)
Spinal  Patient location during procedure: OR Start time: 12/19/2022 12:03 PM End time: 12/19/2022 12:06 PM Reason for block: surgical anesthesia Staffing Performed: anesthesiologist  Anesthesiologist: Collene Schlichter, MD Performed by: Collene Schlichter, MD Authorized by: Collene Schlichter, MD   Preanesthetic Checklist Completed: patient identified, IV checked, risks and benefits discussed, surgical consent, monitors and equipment checked, pre-op evaluation and timeout performed Spinal Block Patient position: left lateral decubitus Prep: DuraPrep and site prepped and draped Patient monitoring: continuous pulse ox and blood pressure Approach: midline Location: L3-4 Injection technique: single-shot Needle Needle type: Pencan  Needle gauge: 24 G Assessment Sensory level: T8 Events: CSF return Additional Notes Functioning IV was confirmed and monitors were applied. Sterile prep and drape, including hand hygiene, mask and sterile gloves were used. The patient was positioned and the spine was prepped. The skin was anesthetized with lidocaine.  Free flow of clear CSF was obtained prior to injecting local anesthetic into the CSF.  The spinal needle aspirated freely following injection.  The needle was carefully withdrawn.  The patient tolerated the procedure well. Consent was obtained prior to procedure with all questions answered and concerns addressed. Risks including but not limited to bleeding, infection, nerve damage, paralysis, failed block, inadequate analgesia, allergic reaction, high spinal, itching and headache were discussed and the patient wished to proceed.   Arrie Aran, MD

## 2022-12-19 NOTE — Discharge Instructions (Signed)

## 2022-12-20 ENCOUNTER — Encounter (HOSPITAL_COMMUNITY): Payer: Self-pay | Admitting: Orthopedic Surgery

## 2022-12-20 ENCOUNTER — Other Ambulatory Visit (HOSPITAL_COMMUNITY): Payer: Medicare Other

## 2022-12-20 DIAGNOSIS — Z8541 Personal history of malignant neoplasm of cervix uteri: Secondary | ICD-10-CM | POA: Diagnosis not present

## 2022-12-20 DIAGNOSIS — S72042A Displaced fracture of base of neck of left femur, initial encounter for closed fracture: Secondary | ICD-10-CM | POA: Diagnosis not present

## 2022-12-20 DIAGNOSIS — Z96653 Presence of artificial knee joint, bilateral: Secondary | ICD-10-CM | POA: Diagnosis not present

## 2022-12-20 DIAGNOSIS — S72002A Fracture of unspecified part of neck of left femur, initial encounter for closed fracture: Secondary | ICD-10-CM | POA: Diagnosis not present

## 2022-12-20 DIAGNOSIS — R9431 Abnormal electrocardiogram [ECG] [EKG]: Secondary | ICD-10-CM | POA: Diagnosis not present

## 2022-12-20 DIAGNOSIS — M1611 Unilateral primary osteoarthritis, right hip: Secondary | ICD-10-CM | POA: Diagnosis not present

## 2022-12-20 DIAGNOSIS — Z87891 Personal history of nicotine dependence: Secondary | ICD-10-CM | POA: Diagnosis not present

## 2022-12-20 DIAGNOSIS — Z96642 Presence of left artificial hip joint: Secondary | ICD-10-CM | POA: Diagnosis not present

## 2022-12-20 LAB — COMPREHENSIVE METABOLIC PANEL
ALT: 24 U/L (ref 0–44)
AST: 37 U/L (ref 15–41)
Albumin: 3.3 g/dL — ABNORMAL LOW (ref 3.5–5.0)
Alkaline Phosphatase: 42 U/L (ref 38–126)
Anion gap: 5 (ref 5–15)
BUN: 19 mg/dL (ref 8–23)
CO2: 20 mmol/L — ABNORMAL LOW (ref 22–32)
Calcium: 8.2 mg/dL — ABNORMAL LOW (ref 8.9–10.3)
Chloride: 110 mmol/L (ref 98–111)
Creatinine, Ser: 0.68 mg/dL (ref 0.44–1.00)
GFR, Estimated: >60 mL/min (ref >60)
Glucose, Bld: 180 mg/dL — ABNORMAL HIGH (ref 70–99)
Potassium: 3.8 mmol/L (ref 3.5–5.1)
Sodium: 135 mmol/L (ref 135–145)
Total Bilirubin: 0.7 mg/dL (ref 0.3–1.2)
Total Protein: 5.6 g/dL — ABNORMAL LOW (ref 6.5–8.1)

## 2022-12-20 LAB — CBC
HCT: 29.4 % — ABNORMAL LOW (ref 36.0–46.0)
Hemoglobin: 9.3 g/dL — ABNORMAL LOW (ref 12.0–15.0)
MCH: 30.4 pg (ref 26.0–34.0)
MCHC: 31.6 g/dL (ref 30.0–36.0)
MCV: 96.1 fL (ref 80.0–100.0)
Platelets: 203 10*3/uL (ref 150–400)
RBC: 3.06 MIL/uL — ABNORMAL LOW (ref 3.87–5.11)
RDW: 13.8 % (ref 11.5–15.5)
WBC: 8.4 10*3/uL (ref 4.0–10.5)
nRBC: 0 % (ref 0.0–0.2)

## 2022-12-20 LAB — HIV ANTIBODY (ROUTINE TESTING W REFLEX): HIV Screen 4th Generation wRfx: NONREACTIVE

## 2022-12-20 MED ORDER — CELECOXIB 200 MG PO CAPS: 200.0000 mg | ORAL_CAPSULE | Freq: Two times a day (BID) | ORAL | 0 refills | Status: AC

## 2022-12-20 NOTE — Care Management CC44 (Signed)
Condition Code 44 Documentation Completed  Patient Details  Name: [name] MRN: [id number] Date of Birth: 01-19-1956   Condition Code 44 given:  Yes Patient signature on Condition Code 44 notice:   (refused to sign) Documentation of 2 MD's agreement:  Yes Code 44 added to claim:  Yes    Gavriella Hearst, LCSW 12/20/2022, 10:57 AM

## 2022-12-20 NOTE — Progress Notes (Signed)
Physical Therapy Treatment Patient Details Name: [name] MRN: [id number] DOB: December 14, 1955 Today's Date: 12/20/2022   History of Present Illness 67 yo female presents to therapy s/p L THA, anterior approach on 12/19/2022 following L femoral neck fx due to mechanical fall. Pt PMH includes but is not limited to, cervical ca, HDL, R wrist sx, migraine, insomnia, B TKA and B revisions.    PT Comments    Pt is mobilizing well, she met PT goals and is ready to DC from a PT standpoint. She ambulated 65' with RW, completed stair training, and demonstrates good understanding of HEP.   Recommendations for follow up therapy are one component of a multi-disciplinary discharge planning process, led by the attending physician.  Recommendations may be updated based on patient status, additional functional criteria and insurance authorization.  Follow Up Recommendations       Assistance Recommended at Discharge Intermittent Supervision/Assistance  Patient can return home with the following A little help with bathing/dressing/bathroom;Assistance with cooking/housework;Assist for transportation;Help with stairs or ramp for entrance   Equipment Recommendations  Rolling walker (2 wheels)    Recommendations for Other Services       Precautions / Restrictions Precautions Precautions: Fall Required Braces or Orthoses: Splint/Cast (B wrist) Restrictions Weight Bearing Restrictions: No     Mobility  Bed Mobility   Bed Mobility: Supine to Sit     Supine to sit: Modified independent (Device/Increase time), HOB elevated     General bed mobility comments: used rails    Transfers Overall transfer level: Needs assistance Equipment used: Rolling walker (2 wheels) Transfers: Sit to/from Stand Sit to Stand: Supervision           General transfer comment: VCs hand placement    Ambulation/Gait Ambulation/Gait assistance: Supervision Gait Distance (Feet): 130 Feet Assistive  device: Rolling walker (2 wheels) Gait Pattern/deviations: Step-to pattern, Antalgic Gait velocity: decreased     General Gait Details: cues for sequencing   Stairs Stairs: Yes Stairs assistance: Supervision Stair Management: Two rails, Step to pattern, Forwards Number of Stairs: 3 General stair comments: VCs sequencing, no loss of balance   Wheelchair Mobility    Modified Rankin (Stroke Patients Only)       Balance Overall balance assessment: Needs assistance, History of Falls Sitting-balance support: Feet supported Sitting balance-Leahy Scale: Good     Standing balance support: Bilateral upper extremity supported, Reliant on assistive device for balance, During functional activity Standing balance-Leahy Scale: Poor                              Cognition Arousal/Alertness: Awake/alert Behavior During Therapy: WFL for tasks assessed/performed Overall Cognitive Status: Within Functional Limits for tasks assessed                                          Exercises Total Joint Exercises Ankle Circles/Pumps: AROM, Both, 15 reps Quad Sets: AROM, Both, 5 reps, Supine Short Arc Quad: AROM, Left, 5 reps, Supine Heel Slides: AAROM, Left, 5 reps, Supine Hip ABduction/ADduction: AROM, Left, 5 reps, Supine Long Arc Quad: AROM, Left, 5 reps, Seated    General Comments        Pertinent Vitals/Pain Pain Assessment Pain Score: 7  Pain Location: L hip Pain Descriptors / Indicators: Aching, Constant, Discomfort, Operative site guarding Pain Intervention(s): Limited activity within patient's tolerance, Monitored during  session, Premedicated before session, Ice applied    Home Living                          Prior Function            PT Goals (current goals can now be found in the care plan section) Acute Rehab PT Goals Patient Stated Goal: to get back to doing things like I was before PT Goal Formulation: With patient Time For  Goal Achievement: 01/02/23 Potential to Achieve Goals: Good Progress towards PT goals: Goals met/education completed, patient discharged from PT    Frequency    7X/week      PT Plan Current plan remains appropriate    Co-evaluation              AM-PAC PT "6 Clicks" Mobility   Outcome Measure  Help needed turning from your back to your side while in a flat bed without using bedrails?: None Help needed moving from lying on your back to sitting on the side of a flat bed without using bedrails?: A Little Help needed moving to and from a bed to a chair (including a wheelchair)?: None Help needed standing up from a chair using your arms (e.g., wheelchair or bedside chair)?: None Help needed to walk in hospital room?: None Help needed climbing 3-5 steps with a railing? : None 6 Click Score: 23    End of Session Equipment Utilized During Treatment: Gait belt Activity Tolerance: Patient tolerated treatment well Patient left: in chair;with call bell/phone within reach;with chair alarm set;with family/visitor present Nurse Communication: Mobility status PT Visit Diagnosis: Unsteadiness on feet (R26.81);Other abnormalities of gait and mobility (R26.89);Muscle weakness (generalized) (M62.81);History of falling (Z91.81);Difficulty in walking, not elsewhere classified (R26.2);Pain Pain - Right/Left: Left Pain - part of body: Hip     Time: 6213-0865 PT Time Calculation (min) (ACUTE ONLY): 30 min  Charges:  $Gait Training: 8-22 mins $Therapeutic Exercise: 8-22 mins                     Ralene Bathe Kistler PT 12/20/2022  Acute Rehabilitation Services  Office [phone]

## 2022-12-20 NOTE — Discharge Summary (Signed)
Physician Discharge Summary  Patient ID: [name] MRN: [id number] DOB/AGE: 1955-07-14 67 y.o.  Admit date: 12/19/2022 Discharge date: 12/20/2022  Admission Diagnoses:  Discharge Diagnoses:  Principal Problem:   Closed left hip fracture, initial encounter [facility]) Active Problems:   History of migraine headaches   Hyperlipidemia   Insomnia   MRSA carrier   Abnormal EKG   Discharged Condition: good   Discharge Exam: Blood pressure (!) 95/56, pulse (!) 57, temperature 98.1 F (36.7 C), resp. rate 18, height 5\' 6"  (1.676 m), weight 54 kg, SpO2 97 %. General appearance: alert, cooperative, appears stated age, and no distress Head: Normocephalic, without obvious abnormality, atraumatic Eyes: negative findings: lids and lashes normal and conjunctivae and sclerae normal Neck: supple, symmetrical, trachea midline Resp: no retractions Extremities: extremities normal, atraumatic, no cyanosis or edema Neurologic: Alert and oriented X 3, normal strength and tone. Normal symmetric reflexes. Normal coordination and gait Incision/Wound:  Disposition: Discharge disposition: 01-Home or Self Care       Discharge Instructions     Call MD for:  difficulty breathing, headache or visual disturbances   Complete by: As directed    Call MD for:  persistant nausea and vomiting   Complete by: As directed    Call MD for:  severe uncontrolled pain   Complete by: As directed    Call MD for:  temperature >100.4   Complete by: As directed    Diet - low sodium heart healthy   Complete by: As directed    Discharge instructions   Complete by: As directed    INSTRUCTIONS AFTER JOINT REPLACEMENT   Remove items at home which could result in a fall. This includes throw rugs or furniture in walking pathways ICE to the affected joint every three hours while awake for 30 minutes at a time, for at least the first 3-5 days, and then as needed for pain and swelling.  Continue to use ice for pain  and swelling. You may notice swelling that will progress down to the foot and ankle.  This is normal after surgery.  Elevate your leg when you are not up walking on it.   Continue to use the breathing machine you got in the hospital (incentive spirometer) which will help keep your temperature down.  It is common for your temperature to cycle up and down following surgery, especially at night when you are not up moving around and exerting yourself.  The breathing machine keeps your lungs expanded and your temperature down.   DIET:  As you were doing prior to hospitalization, we recommend a well-balanced diet.  DRESSING / WOUND CARE / SHOWERING  Keep the surgical dressing until follow up.  The dressing is water proof, so you can shower without any extra covering.  IF THE DRESSING FALLS OFF or the wound gets wet inside, change the dressing with sterile gauze.  Please use good hand washing techniques before changing the dressing.  Do not use any lotions or creams on the incision until instructed by your surgeon.    ACTIVITY  Increase activity slowly as tolerated, but follow the weight bearing instructions below.   No driving for 6 weeks or until further direction given by your physician.  You cannot drive while taking narcotics.  No lifting or carrying greater than 10 lbs. until further directed by your surgeon. Avoid periods of inactivity such as sitting longer than an hour when not asleep. This helps prevent blood clots.  You may return to work once  you are authorized by your doctor.     WEIGHT BEARING   Weight bearing as tolerated with assist device (walker, cane, etc) as directed, use it as long as suggested by your surgeon or therapist, typically at least 4-6 weeks.   EXERCISES  Results after joint replacement surgery are often greatly improved when you follow the exercise, range of motion and muscle strengthening exercises prescribed by your doctor. Safety measures are also important to  protect the joint from further injury. Any time any of these exercises cause you to have increased pain or swelling, decrease what you are doing until you are comfortable again and then slowly increase them. If you have problems or questions, call your caregiver or physical therapist for advice.   Rehabilitation is important following a joint replacement. After just a few days of immobilization, the muscles of the leg can become weakened and shrink (atrophy).  These exercises are designed to build up the tone and strength of the thigh and leg muscles and to improve motion. Often times heat used for twenty to thirty minutes before working out will loosen up your tissues and help with improving the range of motion but do not use heat for the first two weeks following surgery (sometimes heat can increase post-operative swelling).   These exercises can be done on a training (exercise) mat, on the floor, on a table or on a bed. Use whatever works the best and is most comfortable for you.    Use music or television while you are exercising so that the exercises are a pleasant break in your day. This will make your life better with the exercises acting as a break in your routine that you can look forward to.   Perform all exercises about fifteen times, three times per day or as directed.  You should exercise both the operative leg and the other leg as well.  Exercises include:   Quad Sets - Tighten up the muscle on the front of the thigh (Quad) and hold for 5-10 seconds.   Straight Leg Raises - With your knee straight (if you were given a brace, keep it on), lift the leg to 60 degrees, hold for 3 seconds, and slowly lower the leg.  Perform this exercise against resistance later as your leg gets stronger.  Leg Slides: Lying on your back, slowly slide your foot toward your buttocks, bending your knee up off the floor (only go as far as is comfortable). Then slowly slide your foot back down until your leg is flat on  the floor again.  Angel Wings: Lying on your back spread your legs to the side as far apart as you can without causing discomfort.  Hamstring Strength:  Lying on your back, push your heel against the floor with your leg straight by tightening up the muscles of your buttocks.  Repeat, but this time bend your knee to a comfortable angle, and push your heel against the floor.  You may put a pillow under the heel to make it more comfortable if necessary.   A rehabilitation program following joint replacement surgery can speed recovery and prevent re-injury in the future due to weakened muscles. Contact your doctor or a physical therapist for more information on knee rehabilitation.    CONSTIPATION  Constipation is defined medically as fewer than three stools per week and severe constipation as less than one stool per week.  Even if you have a regular bowel pattern at home, your normal regimen is likely  to be disrupted due to multiple reasons following surgery.  Combination of anesthesia, postoperative narcotics, change in appetite and fluid intake all can affect your bowels.   YOU MUST use at least one of the following options; they are listed in order of increasing strength to get the job done.  They are all available over the counter, and you may need to use some, POSSIBLY even all of these options:    Drink plenty of fluids (prune juice may be helpful) and high fiber foods Colace 100 mg by mouth twice a day  Senokot for constipation as directed and as needed Dulcolax (bisacodyl), take with full glass of water  Miralax (polyethylene glycol) once or twice a day as needed.  If you have tried all these things and are unable to have a bowel movement in the first 3-4 days after surgery call either your surgeon or your primary doctor.    If you experience loose stools or diarrhea, hold the medications until you stool forms back up.  If your symptoms do not get better within 1 week or if they get worse,  check with your doctor.  If you experience "the worst abdominal pain ever" or develop nausea or vomiting, please contact the office immediately for further recommendations for treatment.   ITCHING:  If you experience itching with your medications, try taking only a single pain pill, or even half a pain pill at a time.  You can also use Benadryl over the counter for itching or also to help with sleep.   TED HOSE STOCKINGS:  Use stockings on both legs until for at least 2 weeks or as directed by physician office. They may be removed at night for sleeping.  MEDICATIONS:  See your medication summary on the "After Visit Summary" that nursing will review with you.  You may have some home medications which will be placed on hold until you complete the course of blood thinner medication.  It is important for you to complete the blood thinner medication as prescribed.  PRECAUTIONS:  If you experience chest pain or shortness of breath - call 911 immediately for transfer to the hospital emergency department.   If you develop a fever greater that 101 F, purulent drainage from wound, increased redness or drainage from wound, foul odor from the wound/dressing, or calf pain - CONTACT YOUR SURGEON.                                                   FOLLOW-UP APPOINTMENTS:  If you do not already have a post-op appointment, please call the office for an appointment to be seen by your surgeon.  Guidelines for how soon to be seen are listed in your "After Visit Summary", but are typically between 1-4 weeks after surgery.  OTHER INSTRUCTIONS:   Knee Replacement:  Do not place pillow under knee, focus on keeping the knee straight while resting. CPM instructions: 0-90 degrees, 2 hours in the morning, 2 hours in the afternoon, and 2 hours in the evening. Place foam block, curve side up under heel at all times except when in CPM or when walking.  DO NOT modify, tear, cut, or change the foam block in any  way.  POST-OPERATIVE OPIOID TAPER INSTRUCTIONS: It is important to wean off of your opioid medication as soon as possible. If you do not  need pain medication after your surgery it is ok to stop day one. Opioids include: Codeine, Hydrocodone(Norco, Vicodin), Oxycodone(Percocet, oxycontin) and hydromorphone amongst others.  Long term and even short term use of opiods can cause: Increased pain response Dependence Constipation Depression Respiratory depression And more.  Withdrawal symptoms can include Flu like symptoms Nausea, vomiting And more Techniques to manage these symptoms Hydrate well Eat regular healthy meals Stay active Use relaxation techniques(deep breathing, meditating, yoga) Do Not substitute Alcohol to help with tapering If you have been on opioids for less than two weeks and do not have pain than it is ok to stop all together.  Plan to wean off of opioids This plan should start within one week post op of your joint replacement. Maintain the same interval or time between taking each dose and first decrease the dose.  Cut the total daily intake of opioids by one tablet each day Next start to increase the time between doses. The last dose that should be eliminated is the evening dose.     MAKE SURE YOU:  Understand these instructions.  Get help right away if you are not doing well or get worse.    Thank you for letting us be a part of your medical care team.  It is a privilege we respect greatly.  We hope these instructions will help you stay on track for a fast and full recovery!   Increase activity slowly   Complete by: As directed       Allergies as of 12/20/2022       Reactions   Mupirocin Other (See Comments)   Bumps on tongue and thickness to tongue         Medication List     TAKE these medications    aspirin EC 325 MG tablet Take 1 tablet (325 mg total) by mouth 2 (two) times daily after a meal. Take x 1 month post op to decrease risk of  blood clots. What changed:  when to take this additional instructions   celecoxib 200 MG capsule Commonly known as: CELEBREX Take 200 mg by mouth daily. What changed: Another medication with the same name was added. Make sure you understand how and when to take each.   celecoxib 200 MG capsule Commonly known as: CeleBREX Take 1 capsule (200 mg total) by mouth 2 (two) times daily. What changed: You were already taking a medication with the same name, and this prescription was added. Make sure you understand how and when to take each.   ciclopirox 8 % solution Commonly known as: PENLAC Apply 1 Application topically at bedtime.   docusate sodium 100 MG capsule Commonly known as: Colace Take 1 capsule (100 mg total) by mouth 2 (two) times daily.   estradiol 0.1 MG/GM vaginal cream Commonly known as: ESTRACE Place 1 Applicatorful vaginally 3 (three) times a week.   Lyrica 75 MG capsule Generic drug: pregabalin Take 75 mg by mouth daily.   oxyCODONE-acetaminophen 5-325 MG tablet Commonly known as: PERCOCET/ROXICET Take 1-2 tablets by mouth every 6 (six) hours as needed for severe pain.   pravastatin 40 MG tablet Commonly known as: PRAVACHOL Take 40 mg by mouth daily.   SUMAtriptan 100 MG tablet Commonly known as: IMITREX Take 100 mg by mouth every 2 (two) hours as needed for migraine.   SYSTANE OP Place 1 drop into both eyes in the morning and at bedtime.   tiZANidine 2 MG tablet Commonly known as: ZANAFLEX Take 1 tablet (2 mg  total) by mouth every 8 (eight) hours as needed for muscle spasms.   topiramate 50 MG tablet Commonly known as: TOPAMAX Take 50 mg by mouth 2 (two) times daily.   traMADol 50 MG tablet Commonly known as: ULTRAM Take 50 mg by mouth as needed for moderate pain or severe pain.   traZODone 100 MG tablet Commonly known as: DESYREL Take 100 mg by mouth at bedtime.   valACYclovir 1000 MG tablet Commonly known as: VALTREX Take 1,000 mg by mouth  as needed (outbreaks).   Vitamin D-3 125 MCG (5000 UT) Tabs Take 5,000 Units by mouth daily.               Durable Medical Equipment  (From admission, onward)           Start     Ordered   12/19/22 1605  DME Walker rolling  Once       Question:  Patient needs a walker to treat with the following condition  Answer:  Primary osteoarthritis of right hip   12/19/22 1604   12/19/22 1605  DME 3 n 1  Once        12/19/22 1604            Follow-up Information     Jodi Geralds, MD. Schedule an appointment as soon as possible for a visit in 2 week(s).   Specialty: Orthopedic Surgery Contact information: [address] [place] [address] [phone]                 Signed: Shanon Payor [facility] 12/20/2022, 9:09 AM

## 2022-12-20 NOTE — Plan of Care (Signed)
  Problem: Education: Goal: Knowledge of General Education information will improve Description: Including pain rating scale, medication(s)/side effects and non-pharmacologic comfort measures Outcome: Adequate for Discharge   Problem: Health Behavior/Discharge Planning: Goal: Ability to manage health-related needs will improve Outcome: Adequate for Discharge   Problem: Clinical Measurements: Goal: Ability to maintain clinical measurements within normal limits will improve Outcome: Progressing Goal: Will remain free from infection Outcome: Progressing Goal: Diagnostic test results will improve Outcome: Progressing Goal: Respiratory complications will improve Outcome: Adequate for Discharge Goal: Cardiovascular complication will be avoided Outcome: Progressing   Problem: Activity: Goal: Risk for activity intolerance will decrease Outcome: Adequate for Discharge   Problem: Nutrition: Goal: Adequate nutrition will be maintained Outcome: Completed/Met   Problem: Coping: Goal: Level of anxiety will decrease Outcome: Progressing   Problem: Elimination: Goal: Will not experience complications related to bowel motility Outcome: Progressing Goal: Will not experience complications related to urinary retention Outcome: Completed/Met   Problem: Pain Managment: Goal: General experience of comfort will improve Outcome: Adequate for Discharge   Problem: Safety: Goal: Ability to remain free from injury will improve Outcome: Progressing   Problem: Skin Integrity: Goal: Risk for impaired skin integrity will decrease Outcome: Progressing   Problem: Education: Goal: Knowledge of the prescribed therapeutic regimen will improve Outcome: Progressing Goal: Understanding of discharge needs will improve Outcome: Progressing Goal: Individualized Educational Video(s) Outcome: Completed/Met   Problem: Activity: Goal: Ability to avoid complications of mobility impairment will  improve Outcome: Adequate for Discharge Goal: Ability to tolerate increased activity will improve Outcome: Adequate for Discharge   Problem: Clinical Measurements: Goal: Postoperative complications will be avoided or minimized Outcome: Progressing   Problem: Pain Management: Goal: Pain level will decrease with appropriate interventions Outcome: Adequate for Discharge

## 2022-12-20 NOTE — TOC Transition Note (Signed)
Transition of Care [facility]) - CM/SW Discharge Note   Patient Details  Name: [name] MRN: [id number] Date of Birth: 1955-12-23  Transition of Care [facility]) CM/SW Contact:  Amada Jupiter, LCSW Phone Number: 12/20/2022, 10:44 AM   Clinical Narrative:     Met with pt who confirms need for RW and orders placed for HHPT as well.  No agency preferences.  RW ordered via [employer] for delivery to room.  HHPT arranged with [facility] and contact info placed on AVS.  No further TOC needs.  Final next level of care: Home w Home Health Services Barriers to Discharge: No Barriers Identified   Patient Goals and CMS Choice      Discharge Placement                         Discharge Plan and Services Additional resources added to the After Visit Summary for                  DME Arranged: Walker rolling DME Agency: [employer] Date DME Agency Contacted: 12/20/22   Representative spoke with at DME Agency: Loraine Leriche HH Arranged: PT HH Agency: [facility] Date [facility] Agency Contacted: 12/20/22   Representative spoke with at [facility] Agency: Amy  Social Determinants of Health (SDOH) Interventions SDOH Screenings   Food Insecurity: No Food Insecurity (12/19/2022)  Housing: Low Risk  (12/19/2022)  Transportation Needs: No Transportation Needs (12/19/2022)  Utilities: Not At Risk (12/19/2022)  Tobacco Use: Medium Risk (12/19/2022)     Readmission Risk Interventions    12/20/2022   10:42 AM  Readmission Risk Prevention Plan  Post Dischage Appt Complete  Medication Screening Complete  Transportation Screening Complete

## 2022-12-20 NOTE — Evaluation (Signed)
Occupational Therapy Evaluation Patient Details Name: [name] MRN: [id number] DOB: October 14, 1955 Today's Date: 12/20/2022   History of Present Illness 67 yo female presents to therapy s/p L THA, anterior approach on 12/19/2022 following L femoral neck fx due to mechanical fall. Pt PMH includes but is not limited to, cervical ca, HDL, R wrist sx, migraine, insomnia, B TKA and B revisions.   Clinical Impression   The pt presented with good functional performance during the session. She performed all assessed tasks with modified independence or better, including toileting at bathroom level, ambulating in her room using a RW, upper body dressing, and lower body dressing. OT made simple recommendations to ensure her safe ADL participation in the home setting; she presented with good understanding. She denied having any concerns regarding her planned discharge home today. She does not require additional OT services, therefore OT will sign off.       Recommendations for follow up therapy are one component of a multi-disciplinary discharge planning process, led by the attending physician.  Recommendations may be updated based on patient status, additional functional criteria and insurance authorization.   Assistance Recommended at Discharge PRN  Patient can return home with the following Help with stairs or ramp for entrance;Assistance with cooking/housework    Functional Status Assessment  Patient has not had a recent decline in their functional status  Equipment Recommendations  Other (comment) (Pt already has needed equipment)       Precautions / Restrictions Precautions Precautions: Fall Required Braces or Orthoses:  Splint/Cast: bilateral wrist braces Restrictions Weight Bearing Restrictions: No      Mobility Bed Mobility      General bed mobility comments: pt was received seated in the bedside chair    Transfers Overall transfer level: Modified independent Equipment  used: Rolling walker (2 wheels) Transfers: Sit to/from Stand Sit to Stand: Modified independent (Device/Increase time)                  Balance     Sitting balance-Leahy Scale: Good       Standing balance-Leahy Scale: Good              ADL either performed or assessed with clinical judgement   ADL Overall ADL's : Independent;Modified independent                                                          Pertinent Vitals/Pain Pain Assessment Pain Location: L hip Pain Intervention(s): Limited activity within patient's tolerance, Monitored during session     Hand Dominance     Extremity/Trunk Assessment Upper Extremity Assessment Upper Extremity Assessment: Overall WFL for tasks assessed   Lower Extremity Assessment Lower Extremity Assessment: Overall WFL for tasks assessed       Communication Communication Communication: No difficulties   Cognition Arousal/Alertness: Awake/alert Behavior During Therapy: WFL for tasks assessed/performed Overall Cognitive Status: Within Functional Limits for tasks assessed                         Home Living Family/patient expects to be discharged to:: Private residence Living Arrangements: Spouse/significant other Available Help at Discharge: Family Type of Home: House             Bathroom Shower/Tub: Walk-in shower  Home Equipment: BSC/3in1;Shower seat - built in          Prior Functioning/Environment Prior Level of Function : Independent/Modified Independent                              OT Treatment/Interventions:   No further treatment needs identified       OT Frequency:  N/A       AM-PAC OT "6 Clicks" Daily Activity     Outcome Measure Help from another person eating meals?: None Help from another person taking care of personal grooming?: None Help from another person toileting, which includes using toliet, bedpan, or urinal?: None Help from  another person bathing (including washing, rinsing, drying)?: None Help from another person to put on and taking off regular upper body clothing?: None Help from another person to put on and taking off regular lower body clothing?: None 6 Click Score: 24   End of Session Equipment Utilized During Treatment: Gait belt;Rolling walker (2 wheels) Nurse Communication: Mobility status  Activity Tolerance: Patient tolerated treatment well Patient left: Other (comment) (standing in room)  OT Visit Diagnosis: Pain Pain - Right/Left: Left Pain - part of body: Hip                Time: 1610-9604 OT Time Calculation (min): 31 min Charges:  OT General Charges $OT Visit: 1 Visit OT Evaluation $OT Eval Low Complexity: 1 Low    Marlean Mortell L Armaan Pond, OTR/L 12/20/2022, 1:21 PM

## 2022-12-20 NOTE — Op Note (Signed)
PATIENT ID:      [name]  MRN:     [id number] DOB/AGE:    68/08/1955 / 68 y.o.       OPERATIVE REPORT    DATE OF PROCEDURE: 12/19/2022       PREOPERATIVE DIAGNOSIS:  LEFT HIP FEMORAL NECK FRACTURE                                                       Estimated body mass index is 19.21 kg/m as calculated from the following:   Height as of this encounter: 5\' 6"  (1.676 m).   Weight as of this encounter: 54 kg.     POSTOPERATIVE DIAGNOSIS:  LEFT HIP FEMORAL NECK FRACTURE                                                           PROCEDURE:  1. left total hip arthroplasty using a 48 mm DePuy Pinnacle  Cup, [employer],  neutral liner, a +1.5 36 mm ceramic head,  and a #3  Actis stem, 2.interpretation of multiple intraoperative fluoroscopic images   SURGE ON: Harvie Junior    ASSISTANT:   Gus Puma PA-C  (present throughout entire procedure and necessary for timely completion of the procedure)  ANESTHESIA: spinal  BLOOD LOSS: 600 Tranexamic Acid: 1 gram IV DRAINS: None COMPLICATIONS: None    NDICATIONS FOR PROCEDURE:Patient Femoral neck fracture left side with  posterior displacement on Lateral x-ray Patient has severe unremitting pain and can ambulate only less than 1 block .  We had a long discussion about treatment options but felt that total hip arthroplasty but a give her the best ability to bear weight fully and given the issues with osteoporosis and concerns for healing that this would be the best option.The risks, benefits, and alternatives were discussed at length including but not limited to the risks of infection, bleeding, nerve injury, stiffness, blood clots, the need for revision surgery, cardiopulmonary complications, among others, and they were willing to proceed.Benefits have been discussed. Questions answered.     PROCEDURE IN DETAIL: The patient was identified by armband,  received preoperative IV antibiotics in the holding area at [facility], taken to the operating room , appropriate anesthetic monitors  were attached and spinal anesthesia was induced.  The patient was placed onto the hot bed and all bony prominences were well-padded.The Left hip was prepped and draped for an anterior approach to the hip.  An incision was made and the subcutaneous dissection was down to the level of the tensor fascia.  The fascia was opened and finger dissected.  The bleeders coming across the anterior portion of the hip were identified and cauterized. Retractors were put in place above and below the femoral neck.  The capsule was opened and tagged and a provisional neck cut was made.  The head was removed and sized on the back table.  The acetabulum was sequentially reamed to a level of 47 mm and a 48 mm porous-coated pinnacle cup was hammered into place with 45 of lateral opening and 30 of anteversion.fluoroscopy was used to ensure  this position of the cup.  Attention was turned towards the femur where the leg was Externally rotated, extended, and adduction did.  The femur was sequentially broached until a size of 3 broach gave a perfect fit and fill.at this point a  1.5 mm delta ceramic hip ball was placed and the hip reduced.  Fluoroscopic images were taken to assess the leg length, fit and fill of the stem, and cup position.  We were happy with the construct at this point.  The 3 broach was removed and a final Actis stem with standard offset  and a 1.5 mm ceramic hip ball was placed and reduced.  Final images were taken to make certain there were happy with the position at this point.   The capsule was closed with #1 Vicryl suture.  The tensor fascia was closed with 0 Vicryl suture.  The skin was then closed with combination of 0 and 2-0 Vicryl suture.  The top layer was with 3-0 Monocryl suture.  Benzoin and Steri-Strips were applied  and a sterile compressive dressing was applied and the patient taken to recovery room she noted be in  satisfactory condition.  Past medical Motion for the procedure was approximately 300 cc.  Of note Gus Puma was with the entire case and assisted by retraction of tissues, manipulation of the leg, and closing the minimize or time.    Harvie Junior 12/20/2022, 6:30 AM

## 2022-12-20 NOTE — Progress Notes (Signed)
Subjective: 1 Day Post-Op Procedure(s) (LRB): TOTAL HIP ARTHROPLASTY ANTERIOR APPROACH (Left).  The patient did well overnight.  She did have some moderate pain symptoms and was treated in the hospital with oxycodone.  She feels that she will be ready for discharge today.  She says she typically does better with Dilaudid than Oxy for pain control.Patient reports pain today as moderate.    Objective: Vital signs in last 24 hours: Temp:  [97.3 F (36.3 C)-98.4 F (36.9 C)] 98.1 F (36.7 C) (06/19 0551) Pulse Rate:  [50-73] 57 (06/19 0551) Resp:  [11-18] 18 (06/19 0551) BP: (92-115)/(49-97) 95/56 (06/19 0551) SpO2:  [93 %-100 %] 97 % (06/19 0551) Weight:  [54 kg] 54 kg (06/18 0952)  Intake/Output from previous day: 06/18 0701 - 06/19 0700 In: 4245 [P.O.:1145; I.V.:2300; IV Piggyback:800] Out: 1450 [Urine:700; Blood:750] Intake/Output this shift: Total I/O In: 120 [P.O.:120] Out: -   Recent Labs    12/19/22 0930 12/20/22 0639  HGB 12.3 9.3*   Recent Labs    12/19/22 0930 12/20/22 0639  WBC 5.4 8.4  RBC 4.08 3.06*  HCT 39.1 29.4*  PLT 262 203   Recent Labs    12/19/22 0930 12/20/22 0639  NA 138 135  K 3.7 3.8  CL 106 110  CO2 25 20*  BUN 21 19  CREATININE 0.70 0.68  GLUCOSE 96 180*  CALCIUM 8.8* 8.2*   No results for input(s): "LABPT", "INR" in the last 72 hours.  Neurologically intact ABD soft Neurovascular intact Sensation intact distally Intact pulses distally Dorsiflexion/Plantar flexion intact Incision: dressing C/D/I and no drainage No cellulitis present Compartment soft   Assessment/Plan: 1 Day Post-Op Procedure(s) (LRB): TOTAL HIP ARTHROPLASTY ANTERIOR APPROACH (Left) Chan feels that she will be ready for discharge today.  We will discharge her home on Dilaudid, 2 mg.  She can take 1 tablet p.o. every 4-6 hours as needed for pain.  We will also send her home on Celebrex, tizanidine, aspirin and docusate sodium.. Advance diet Up with  therapy  Patient's anticipated LOS is less than 2 midnights, meeting these requirements: - Lives within 1 hour of care - Has a competent adult at home to recover with post-op recover - NO history of  - Chronic pain requiring opiods  - Diabetes  - Coronary Artery Disease  - Heart failure  - Heart attack  - Stroke  - DVT/VTE  - Cardiac arrhythmia  - Respiratory Failure/COPD  - Renal failure  - Anemia  - Advanced Liver disease     Shanon Payor [facility] 12/20/2022, 8:57 AM

## 2022-12-20 NOTE — Care Management Obs Status (Signed)
MEDICARE OBSERVATION STATUS NOTIFICATION   Patient Details  Name: [name] MRN: [id number] Date of Birth: 1956-01-29   Medicare Observation Status Notification Given:  Hart Robinsons, LCSW 12/20/2022, 10:57 AM

## 2022-12-21 DIAGNOSIS — E785 Hyperlipidemia, unspecified: Secondary | ICD-10-CM | POA: Diagnosis not present

## 2022-12-21 DIAGNOSIS — M1990 Unspecified osteoarthritis, unspecified site: Secondary | ICD-10-CM | POA: Diagnosis not present

## 2022-12-21 DIAGNOSIS — K90821 Short bowel syndrome with colon in continuity: Secondary | ICD-10-CM | POA: Diagnosis not present

## 2022-12-21 DIAGNOSIS — G43909 Migraine, unspecified, not intractable, without status migrainosus: Secondary | ICD-10-CM | POA: Diagnosis not present

## 2022-12-21 DIAGNOSIS — Z8541 Personal history of malignant neoplasm of cervix uteri: Secondary | ICD-10-CM | POA: Diagnosis not present

## 2022-12-21 DIAGNOSIS — Z7982 Long term (current) use of aspirin: Secondary | ICD-10-CM | POA: Diagnosis not present

## 2022-12-21 DIAGNOSIS — S72002D Fracture of unspecified part of neck of left femur, subsequent encounter for closed fracture with routine healing: Secondary | ICD-10-CM | POA: Diagnosis not present

## 2022-12-21 DIAGNOSIS — Z22322 Carrier or suspected carrier of Methicillin resistant Staphylococcus aureus: Secondary | ICD-10-CM | POA: Diagnosis not present

## 2022-12-21 DIAGNOSIS — Z79891 Long term (current) use of opiate analgesic: Secondary | ICD-10-CM | POA: Diagnosis not present

## 2022-12-27 DIAGNOSIS — M1990 Unspecified osteoarthritis, unspecified site: Secondary | ICD-10-CM | POA: Diagnosis not present

## 2022-12-27 DIAGNOSIS — S72002D Fracture of unspecified part of neck of left femur, subsequent encounter for closed fracture with routine healing: Secondary | ICD-10-CM | POA: Diagnosis not present

## 2022-12-27 DIAGNOSIS — Z22322 Carrier or suspected carrier of Methicillin resistant Staphylococcus aureus: Secondary | ICD-10-CM | POA: Diagnosis not present

## 2022-12-27 DIAGNOSIS — E785 Hyperlipidemia, unspecified: Secondary | ICD-10-CM | POA: Diagnosis not present

## 2022-12-27 DIAGNOSIS — K90821 Short bowel syndrome with colon in continuity: Secondary | ICD-10-CM | POA: Diagnosis not present

## 2022-12-27 DIAGNOSIS — G43909 Migraine, unspecified, not intractable, without status migrainosus: Secondary | ICD-10-CM | POA: Diagnosis not present

## 2022-12-29 DIAGNOSIS — M1990 Unspecified osteoarthritis, unspecified site: Secondary | ICD-10-CM | POA: Diagnosis not present

## 2022-12-29 DIAGNOSIS — Z22322 Carrier or suspected carrier of Methicillin resistant Staphylococcus aureus: Secondary | ICD-10-CM | POA: Diagnosis not present

## 2022-12-29 DIAGNOSIS — G43909 Migraine, unspecified, not intractable, without status migrainosus: Secondary | ICD-10-CM | POA: Diagnosis not present

## 2022-12-29 DIAGNOSIS — E785 Hyperlipidemia, unspecified: Secondary | ICD-10-CM | POA: Diagnosis not present

## 2022-12-29 DIAGNOSIS — K90821 Short bowel syndrome with colon in continuity: Secondary | ICD-10-CM | POA: Diagnosis not present

## 2022-12-29 DIAGNOSIS — S72002D Fracture of unspecified part of neck of left femur, subsequent encounter for closed fracture with routine healing: Secondary | ICD-10-CM | POA: Diagnosis not present

## 2023-01-01 DIAGNOSIS — J449 Chronic obstructive pulmonary disease, unspecified: Secondary | ICD-10-CM | POA: Diagnosis not present

## 2023-01-01 DIAGNOSIS — M150 Primary generalized (osteo)arthritis: Secondary | ICD-10-CM | POA: Diagnosis not present

## 2023-01-01 DIAGNOSIS — S72002A Fracture of unspecified part of neck of left femur, initial encounter for closed fracture: Secondary | ICD-10-CM | POA: Diagnosis not present

## 2023-01-02 DIAGNOSIS — M1990 Unspecified osteoarthritis, unspecified site: Secondary | ICD-10-CM | POA: Diagnosis not present

## 2023-01-02 DIAGNOSIS — G43909 Migraine, unspecified, not intractable, without status migrainosus: Secondary | ICD-10-CM | POA: Diagnosis not present

## 2023-01-02 DIAGNOSIS — S72042D Displaced fracture of base of neck of left femur, subsequent encounter for closed fracture with routine healing: Secondary | ICD-10-CM | POA: Diagnosis not present

## 2023-01-02 DIAGNOSIS — S72002D Fracture of unspecified part of neck of left femur, subsequent encounter for closed fracture with routine healing: Secondary | ICD-10-CM | POA: Diagnosis not present

## 2023-01-02 DIAGNOSIS — Z22322 Carrier or suspected carrier of Methicillin resistant Staphylococcus aureus: Secondary | ICD-10-CM | POA: Diagnosis not present

## 2023-01-03 DIAGNOSIS — E785 Hyperlipidemia, unspecified: Secondary | ICD-10-CM | POA: Diagnosis not present

## 2023-01-03 DIAGNOSIS — K90821 Short bowel syndrome with colon in continuity: Secondary | ICD-10-CM | POA: Diagnosis not present

## 2023-01-03 DIAGNOSIS — M1990 Unspecified osteoarthritis, unspecified site: Secondary | ICD-10-CM | POA: Diagnosis not present

## 2023-01-03 DIAGNOSIS — S72002D Fracture of unspecified part of neck of left femur, subsequent encounter for closed fracture with routine healing: Secondary | ICD-10-CM | POA: Diagnosis not present

## 2023-01-03 DIAGNOSIS — Z22322 Carrier or suspected carrier of Methicillin resistant Staphylococcus aureus: Secondary | ICD-10-CM | POA: Diagnosis not present

## 2023-01-03 DIAGNOSIS — G43909 Migraine, unspecified, not intractable, without status migrainosus: Secondary | ICD-10-CM | POA: Diagnosis not present

## 2023-01-10 DIAGNOSIS — M1990 Unspecified osteoarthritis, unspecified site: Secondary | ICD-10-CM | POA: Diagnosis not present

## 2023-01-10 DIAGNOSIS — S72002D Fracture of unspecified part of neck of left femur, subsequent encounter for closed fracture with routine healing: Secondary | ICD-10-CM | POA: Diagnosis not present

## 2023-01-10 DIAGNOSIS — Z22322 Carrier or suspected carrier of Methicillin resistant Staphylococcus aureus: Secondary | ICD-10-CM | POA: Diagnosis not present

## 2023-01-10 DIAGNOSIS — K90821 Short bowel syndrome with colon in continuity: Secondary | ICD-10-CM | POA: Diagnosis not present

## 2023-01-10 DIAGNOSIS — E785 Hyperlipidemia, unspecified: Secondary | ICD-10-CM | POA: Diagnosis not present

## 2023-01-10 DIAGNOSIS — G43909 Migraine, unspecified, not intractable, without status migrainosus: Secondary | ICD-10-CM | POA: Diagnosis not present

## 2023-01-30 DIAGNOSIS — M7062 Trochanteric bursitis, left hip: Secondary | ICD-10-CM | POA: Diagnosis not present

## 2023-03-01 DIAGNOSIS — M7062 Trochanteric bursitis, left hip: Secondary | ICD-10-CM | POA: Diagnosis not present

## 2023-03-02 ENCOUNTER — Other Ambulatory Visit (HOSPITAL_COMMUNITY): Payer: Self-pay | Admitting: Internal Medicine

## 2023-03-02 DIAGNOSIS — E2839 Other primary ovarian failure: Secondary | ICD-10-CM

## 2023-03-08 DIAGNOSIS — M7632 Iliotibial band syndrome, left leg: Secondary | ICD-10-CM | POA: Diagnosis not present

## 2023-03-08 DIAGNOSIS — Z96642 Presence of left artificial hip joint: Secondary | ICD-10-CM | POA: Diagnosis not present

## 2023-03-08 DIAGNOSIS — M7062 Trochanteric bursitis, left hip: Secondary | ICD-10-CM | POA: Diagnosis not present

## 2023-03-09 ENCOUNTER — Other Ambulatory Visit (HOSPITAL_COMMUNITY): Payer: Medicare Other

## 2023-03-09 ENCOUNTER — Ambulatory Visit (HOSPITAL_COMMUNITY)
Admission: RE | Admit: 2023-03-09 | Discharge: 2023-03-09 | Disposition: A | Discharge status: Home / Self Care | Payer: Medicare Other | Source: Ambulatory Visit | Attending: Internal Medicine | Admitting: Internal Medicine

## 2023-03-09 DIAGNOSIS — E2839 Other primary ovarian failure: Secondary | ICD-10-CM | POA: Insufficient documentation

## 2023-03-09 DIAGNOSIS — M810 Age-related osteoporosis without current pathological fracture: Secondary | ICD-10-CM | POA: Diagnosis not present

## 2023-03-12 DIAGNOSIS — Z96642 Presence of left artificial hip joint: Secondary | ICD-10-CM | POA: Diagnosis not present

## 2023-03-12 DIAGNOSIS — M7632 Iliotibial band syndrome, left leg: Secondary | ICD-10-CM | POA: Diagnosis not present

## 2023-03-12 DIAGNOSIS — M7062 Trochanteric bursitis, left hip: Secondary | ICD-10-CM | POA: Diagnosis not present

## 2023-03-14 DIAGNOSIS — M7632 Iliotibial band syndrome, left leg: Secondary | ICD-10-CM | POA: Diagnosis not present

## 2023-03-14 DIAGNOSIS — Z96642 Presence of left artificial hip joint: Secondary | ICD-10-CM | POA: Diagnosis not present

## 2023-03-14 DIAGNOSIS — M7062 Trochanteric bursitis, left hip: Secondary | ICD-10-CM | POA: Diagnosis not present

## 2023-03-19 DIAGNOSIS — M7062 Trochanteric bursitis, left hip: Secondary | ICD-10-CM | POA: Diagnosis not present

## 2023-03-19 DIAGNOSIS — M7632 Iliotibial band syndrome, left leg: Secondary | ICD-10-CM | POA: Diagnosis not present

## 2023-03-19 DIAGNOSIS — Z96642 Presence of left artificial hip joint: Secondary | ICD-10-CM | POA: Diagnosis not present

## 2023-03-21 DIAGNOSIS — M7062 Trochanteric bursitis, left hip: Secondary | ICD-10-CM | POA: Diagnosis not present

## 2023-03-21 DIAGNOSIS — Z96642 Presence of left artificial hip joint: Secondary | ICD-10-CM | POA: Diagnosis not present

## 2023-03-21 DIAGNOSIS — M7632 Iliotibial band syndrome, left leg: Secondary | ICD-10-CM | POA: Diagnosis not present

## 2023-03-29 DIAGNOSIS — M7062 Trochanteric bursitis, left hip: Secondary | ICD-10-CM | POA: Diagnosis not present

## 2023-03-29 DIAGNOSIS — M7632 Iliotibial band syndrome, left leg: Secondary | ICD-10-CM | POA: Diagnosis not present

## 2023-03-29 DIAGNOSIS — Z96642 Presence of left artificial hip joint: Secondary | ICD-10-CM | POA: Diagnosis not present

## 2023-04-02 DIAGNOSIS — Z96642 Presence of left artificial hip joint: Secondary | ICD-10-CM | POA: Diagnosis not present

## 2023-04-02 DIAGNOSIS — M7632 Iliotibial band syndrome, left leg: Secondary | ICD-10-CM | POA: Diagnosis not present

## 2023-04-02 DIAGNOSIS — M7062 Trochanteric bursitis, left hip: Secondary | ICD-10-CM | POA: Diagnosis not present

## 2023-04-03 ENCOUNTER — Other Ambulatory Visit: Payer: Self-pay | Admitting: Internal Medicine

## 2023-04-03 DIAGNOSIS — Z1231 Encounter for screening mammogram for malignant neoplasm of breast: Secondary | ICD-10-CM

## 2023-05-02 ENCOUNTER — Ambulatory Visit
Admission: RE | Admit: 2023-05-02 | Discharge: 2023-05-02 | Disposition: A | Discharge status: Home / Self Care | Payer: Medicare Other | Source: Ambulatory Visit | Attending: Internal Medicine | Admitting: Internal Medicine

## 2023-05-02 DIAGNOSIS — Z1231 Encounter for screening mammogram for malignant neoplasm of breast: Secondary | ICD-10-CM | POA: Diagnosis not present

## 2023-05-14 DIAGNOSIS — S72002D Fracture of unspecified part of neck of left femur, subsequent encounter for closed fracture with routine healing: Secondary | ICD-10-CM | POA: Diagnosis not present

## 2023-05-14 DIAGNOSIS — J449 Chronic obstructive pulmonary disease, unspecified: Secondary | ICD-10-CM | POA: Diagnosis not present

## 2023-05-14 DIAGNOSIS — Z1331 Encounter for screening for depression: Secondary | ICD-10-CM | POA: Diagnosis not present

## 2023-05-14 DIAGNOSIS — Z0001 Encounter for general adult medical examination with abnormal findings: Secondary | ICD-10-CM | POA: Diagnosis not present

## 2023-05-14 DIAGNOSIS — Z23 Encounter for immunization: Secondary | ICD-10-CM | POA: Diagnosis not present

## 2023-05-14 DIAGNOSIS — M150 Primary generalized (osteo)arthritis: Secondary | ICD-10-CM | POA: Diagnosis not present

## 2023-05-14 DIAGNOSIS — I700 Atherosclerosis of aorta: Secondary | ICD-10-CM | POA: Diagnosis not present

## 2023-05-14 DIAGNOSIS — Z6820 Body mass index (BMI) 20.0-20.9, adult: Secondary | ICD-10-CM | POA: Diagnosis not present

## 2023-05-15 ENCOUNTER — Other Ambulatory Visit (HOSPITAL_COMMUNITY): Payer: Self-pay | Admitting: Internal Medicine

## 2023-05-15 DIAGNOSIS — Z87891 Personal history of nicotine dependence: Secondary | ICD-10-CM

## 2023-05-15 DIAGNOSIS — Z122 Encounter for screening for malignant neoplasm of respiratory organs: Secondary | ICD-10-CM

## 2023-06-04 ENCOUNTER — Ambulatory Visit
Admission: EM | Admit: 2023-06-04 | Discharge: 2023-06-04 | Disposition: A | Discharge status: Home / Self Care | Payer: Medicare Other

## 2023-06-04 DIAGNOSIS — R0982 Postnasal drip: Secondary | ICD-10-CM

## 2023-06-04 DIAGNOSIS — R591 Generalized enlarged lymph nodes: Secondary | ICD-10-CM | POA: Diagnosis not present

## 2023-06-04 NOTE — Discharge Instructions (Signed)
I suspect that tender area under your jaw may be a reactive lymph node.  This may be coming from the postnasal drainage.  Recommend starting Flonase daily.  If the area becomes more swollen, red, or tender, please seek care.  Please also seek care if the area does not improve with the Flonase.

## 2023-06-04 NOTE — ED Triage Notes (Signed)
Pt reports  knot on left side the neck, states she was at the Dentist office and he was cleaning her teeth when he checked her neck and felt a lump and told her she needed to be seen. Recalls stiffness, intermittently the past few days

## 2023-06-04 NOTE — ED Provider Notes (Signed)
[facility]    CSN: [id number] Arrival date & time: 06/04/23  1534      History   Chief Complaint No chief complaint on file.   HPI [name] is a 67 y.o. female.   Patient presents today with concern for "knot" on the left side of her neck that her dentist noticed this morning during her regular dental checkup.  She denies recent fever, cough, congestion.  She does have some postnasal drainage and scratchy throat that occurs this time of year due to allergies, does not take anything for allergies.  No recent sicknesses, recent unexplained weight loss, or night sweats.  She reports her dentist told her to have it checked out ASAP and primary care provider is out of town this week.    Past Medical History:  Diagnosis Date   Arthritis    Cancer (HCC) 07/03/1976   cervical cancer   Carpal tunnel syndrome    Right   Headache(784.0)    History of migraine    last one was on 06/02/12;takes Topamax bid and Imitrex prn   Hyperlipidemia    takes Simvastatin nightly   Insomnia    takes Trazodone nightly   Joint pain    Joint swelling    PONV (postoperative nausea and vomiting)    Seasonal allergies    in spring    Patient Active Problem List   Diagnosis Date Noted   Closed left hip fracture, initial encounter (HCC) 12/19/2022   History of migraine headaches 12/19/2022   Hyperlipidemia 12/19/2022   Insomnia 12/19/2022   MRSA carrier 12/19/2022   Abnormal EKG 12/19/2022   Arthrofibrosis of knee joint 06/07/2015   Status post right knee replacement 06/07/2015   Primary osteoarthritis of right knee 04/05/2015   Pain due to knee joint prosthesis (HCC) 04/05/2015   Status post revision of total replacement of left knee 04/15/2013   Pain due to total left knee replacement (HCC) 03/27/2013   Muscle weakness (generalized) 07/02/2012   Total knee replacement status 07/02/2012   Difficulty walking 07/02/2012   Stiffness of left knee 07/02/2012   Pain in  left knee 07/02/2012   Chronic pain syndrome 11/06/2011   Tenosynovitis of hand 04/26/2011    Past Surgical History:  Procedure Laterality Date   ABDOMINAL HYSTERECTOMY  1978   APPENDECTOMY  2008   ARTROPLASTY     bladder tacked  1999   BREAST EXCISIONAL BIOPSY Left 2009   BREAST SURGERY  2009   cyst removed from left breast   COLONOSCOPY     cyst removed from right knee   1972   KNEE CLOSED REDUCTION Right 06/07/2015   Procedure: CLOSED MANIPULATION RIGHT KNEE;  Surgeon: Jodi Geralds, MD;  Location: [facility];  Service: Orthopedics;  Laterality: Right;   left knee surgery  1973   left knee surgery  2012   right carpal tunnel     right knee arthroscopy  90's    x 3   right knee partial replacement  1991   right wrist sugery  2008   scar tissue removed  2008   TOTAL HIP ARTHROPLASTY Left 12/19/2022   Procedure: TOTAL HIP ARTHROPLASTY ANTERIOR APPROACH;  Surgeon: Jodi Geralds, MD;  Location: [facility];  Service: Orthopedics;  Laterality: Left;   TOTAL KNEE ARTHROPLASTY  06/12/2012   Procedure: TOTAL KNEE ARTHROPLASTY;  Surgeon: Loreta Ave, MD;  Location[facility];  Service: Orthopedics;  Laterality: Left;   TOTAL KNEE ARTHROPLASTY Right 04/05/2015  TOTAL KNEE REVISION Left 03/26/2013   Procedure: TOTAL KNEE REVISION- left;  Surgeon: Harvie Junior, MD;  Location: [facility] OR;  Service: Orthopedics;  Laterality: Left;   TOTAL KNEE REVISION Right 04/05/2015   Procedure: TOTAL KNEE REVISION;  Surgeon: Jodi Geralds, MD;  Location: [facility] OR;  Service: Orthopedics;  Laterality: Right;    OB History     Gravida  3   Para  3   Term  1   Preterm  2   AB      Living  1      SAB      IAB      Ectopic      Multiple      Live Births               Home Medications    Prior to Admission medications   Medication Sig Start Date End Date Taking? Authorizing Provider  aspirin EC 325 MG tablet Take 1 tablet (325 mg total) by mouth 2 (two) times daily after a  meal. Take x 1 month post op to decrease risk of blood clots. 12/19/22   Marshia Ly, PA-C  celecoxib (CELEBREX) 200 MG capsule Take 200 mg by mouth daily.    [provider]  celecoxib (CELEBREX) 200 MG capsule Take 1 capsule (200 mg total) by mouth 2 (two) times daily. 12/20/22 12/20/23  Shanon Payor, PA-C  Cholecalciferol (VITAMIN D-3) 125 MCG (5000 UT) TABS Take 5,000 Units by mouth daily.    [provider]  ciclopirox (PENLAC) 8 % solution Apply 1 Application topically at bedtime. 09/19/22   [provider]  docusate sodium (COLACE) 100 MG capsule Take 1 capsule (100 mg total) by mouth 2 (two) times daily. 12/19/22   Marshia Ly, PA-C  estradiol (ESTRACE) 0.1 MG/GM vaginal cream Place 1 Applicatorful vaginally 3 (three) times a week.    [provider]  LYRICA 75 MG capsule Take 75 mg by mouth daily. 03/10/14   [provider]  oxyCODONE-acetaminophen (PERCOCET/ROXICET) 5-325 MG tablet Take 1-2 tablets by mouth every 6 (six) hours as needed for severe pain. 12/19/22   Marshia Ly, PA-C  Polyethyl Glycol-Propyl Glycol (SYSTANE OP) Place 1 drop into both eyes in the morning and at bedtime.    [provider]  pravastatin (PRAVACHOL) 40 MG tablet Take 40 mg by mouth daily.    [provider]  SUMAtriptan (IMITREX) 100 MG tablet Take 100 mg by mouth every 2 (two) hours as needed for migraine.     [provider]  tiZANidine (ZANAFLEX) 2 MG tablet Take 1 tablet (2 mg total) by mouth every 8 (eight) hours as needed for muscle spasms. 12/19/22   Marshia Ly, PA-C  topiramate (TOPAMAX) 50 MG tablet Take 50 mg by mouth 2 (two) times daily.    [provider]  traMADol (ULTRAM) 50 MG tablet Take 50 mg by mouth as needed for moderate pain or severe pain. 03/24/14   [provider]  traZODone (DESYREL) 100 MG tablet Take 100 mg by mouth at bedtime.    [provider]  valACYclovir (VALTREX) 1000 MG  tablet Take 1,000 mg by mouth as needed (outbreaks).    [provider]    Family History Family History  Problem Relation Age of Onset   Stroke Mother    Hypertension Mother    Hypertension Sister    Stroke Sister    Kidney disease Sister    Lung cancer Sister  Breast cancer Neg Hx     Social History Social History   Tobacco Use   Smoking status: Former    Current packs/day: 0.00    Types: Cigarettes    Quit date: 06/12/2000    Years since quitting: 22.9   Smokeless tobacco: Never   Tobacco comments:    quit 10+yrs ago  Vaping Use   Vaping status: Never Used  Substance Use Topics   Alcohol use: No   Drug use: No     Allergies   Mupirocin   Review of Systems Review of Systems Per HPI  Physical Exam Triage Vital Signs ED Triage Vitals  Encounter Vitals Group     BP 06/04/23 1659 (!) 140/79     Systolic BP Percentile --      Diastolic BP Percentile --      Pulse Rate 06/04/23 1659 77     Resp 06/04/23 1659 18     Temp 06/04/23 1659 (!) 97.5 F (36.4 C)     Temp Source 06/04/23 1659 Oral     SpO2 06/04/23 1659 95 %     Weight --      Height --      Head Circumference --      Peak Flow --      Pain Score 06/04/23 1704 0     Pain Loc --      Pain Education --      Exclude from Growth Chart --    No data found.  Updated Vital Signs BP (!) 140/79 (BP Location: Right Arm)   Pulse 77   Temp (!) 97.5 F (36.4 C) (Oral)   Resp 18   SpO2 95%   Visual Acuity Right Eye Distance:   Left Eye Distance:   Bilateral Distance:    Right Eye Near:   Left Eye Near:    Bilateral Near:     Physical Exam Vitals and nursing note reviewed.  Constitutional:      General: She is not in acute distress.    Appearance: Normal appearance. She is not ill-appearing or toxic-appearing.  HENT:     Head: Normocephalic and atraumatic.      Comments: Hard mass appreciated to left jawline; mass is tender to touch.  No surrounding erythema or fluctuance.   Mass is not visualized by the naked eye.    Right Ear: Ear canal and external ear normal. A middle ear effusion is present.     Left Ear: Ear canal and external ear normal. A middle ear effusion is present.     Nose: Congestion present. No rhinorrhea.     Mouth/Throat:     Mouth: Mucous membranes are moist.     Pharynx: Oropharynx is clear. No oropharyngeal exudate or posterior oropharyngeal erythema.     Comments: Postnasal drainage Eyes:     General: No scleral icterus.    Extraocular Movements: Extraocular movements intact.  Cardiovascular:     Rate and Rhythm: Normal rate and regular rhythm.  Pulmonary:     Effort: Pulmonary effort is normal. No respiratory distress.  Musculoskeletal:     Cervical back: Normal range of motion and neck supple.  Lymphadenopathy:     Cervical: No cervical adenopathy.  Skin:    General: Skin is warm and dry.     Coloration: Skin is not jaundiced or pale.     Findings: No erythema or rash.  Neurological:     Mental Status: She is alert and oriented to person, place, and  time.  Psychiatric:        Behavior: Behavior is cooperative.      UC Treatments / Results  Labs (all labs ordered are listed, but only abnormal results are displayed) Labs Reviewed - No data to display  EKG   Radiology No results found.  Procedures Procedures (including critical care time)  Medications Ordered in UC Medications - No data to display  Initial Impression / Assessment and Plan / UC Course  I have reviewed the triage vital signs and the nursing notes.  Pertinent labs & imaging results that were available during my care of the patient were reviewed by me and considered in my medical decision making (see chart for details).   Patient is well-appearing, normotensive, afebrile, not tachycardic, not tachypneic, oxygenating well on room air.    1. Lymphadenopathy 2. Post-nasal drainage Suspect probable reactive lymphadenopathy due to postnasal  drainage Recommended starting Flonase daily Return or follow-up with PCP if symptoms persist/worsen despite treatment  The patient was given the opportunity to ask questions.  All questions answered to their satisfaction.  The patient is in agreement to this plan.    Final Clinical Impressions(s) / UC Diagnoses   Final diagnoses:  Lymphadenopathy  Post-nasal drainage     Discharge Instructions      I suspect that tender area under your jaw may be a reactive lymph node.  This may be coming from the postnasal drainage.  Recommend starting Flonase daily.  If the area becomes more swollen, red, or tender, please seek care.  Please also seek care if the area does not improve with the Flonase.    ED Prescriptions   None    PDMP not reviewed this encounter.   Valentino Nose, NP 06/04/23 1743

## 2023-06-21 ENCOUNTER — Ambulatory Visit (HOSPITAL_COMMUNITY)
Admission: RE | Admit: 2023-06-21 | Discharge: 2023-06-21 | Disposition: A | Discharge status: Home / Self Care | Payer: Medicare Other | Source: Ambulatory Visit | Attending: Internal Medicine | Admitting: Internal Medicine

## 2023-06-21 DIAGNOSIS — Z87891 Personal history of nicotine dependence: Secondary | ICD-10-CM | POA: Insufficient documentation

## 2023-06-21 DIAGNOSIS — Z122 Encounter for screening for malignant neoplasm of respiratory organs: Secondary | ICD-10-CM | POA: Diagnosis not present

## 2023-07-11 DIAGNOSIS — H5203 Hypermetropia, bilateral: Secondary | ICD-10-CM | POA: Diagnosis not present

## 2023-07-11 DIAGNOSIS — H04123 Dry eye syndrome of bilateral lacrimal glands: Secondary | ICD-10-CM | POA: Diagnosis not present

## 2023-07-11 DIAGNOSIS — H25813 Combined forms of age-related cataract, bilateral: Secondary | ICD-10-CM | POA: Diagnosis not present

## 2023-07-12 DIAGNOSIS — G9332 Myalgic encephalomyelitis/chronic fatigue syndrome: Secondary | ICD-10-CM | POA: Diagnosis not present

## 2023-07-12 DIAGNOSIS — Z0001 Encounter for general adult medical examination with abnormal findings: Secondary | ICD-10-CM | POA: Diagnosis not present

## 2023-07-12 DIAGNOSIS — E782 Mixed hyperlipidemia: Secondary | ICD-10-CM | POA: Diagnosis not present

## 2023-07-12 DIAGNOSIS — D518 Other vitamin B12 deficiency anemias: Secondary | ICD-10-CM | POA: Diagnosis not present

## 2023-07-12 DIAGNOSIS — E559 Vitamin D deficiency, unspecified: Secondary | ICD-10-CM | POA: Diagnosis not present

## 2023-08-20 DIAGNOSIS — Z124 Encounter for screening for malignant neoplasm of cervix: Secondary | ICD-10-CM | POA: Diagnosis not present

## 2023-08-20 DIAGNOSIS — Z1272 Encounter for screening for malignant neoplasm of vagina: Secondary | ICD-10-CM | POA: Diagnosis not present

## 2023-08-20 DIAGNOSIS — N952 Postmenopausal atrophic vaginitis: Secondary | ICD-10-CM | POA: Diagnosis not present

## 2023-08-20 DIAGNOSIS — Z6823 Body mass index (BMI) 23.0-23.9, adult: Secondary | ICD-10-CM | POA: Diagnosis not present

## 2023-09-13 DIAGNOSIS — Z1272 Encounter for screening for malignant neoplasm of vagina: Secondary | ICD-10-CM | POA: Diagnosis not present

## 2023-10-02 DIAGNOSIS — L814 Other melanin hyperpigmentation: Secondary | ICD-10-CM | POA: Diagnosis not present

## 2023-10-02 DIAGNOSIS — B001 Herpesviral vesicular dermatitis: Secondary | ICD-10-CM | POA: Diagnosis not present

## 2023-10-02 DIAGNOSIS — L578 Other skin changes due to chronic exposure to nonionizing radiation: Secondary | ICD-10-CM | POA: Diagnosis not present

## 2023-10-02 DIAGNOSIS — L821 Other seborrheic keratosis: Secondary | ICD-10-CM | POA: Diagnosis not present

## 2023-10-02 DIAGNOSIS — Z85828 Personal history of other malignant neoplasm of skin: Secondary | ICD-10-CM | POA: Diagnosis not present

## 2023-10-02 DIAGNOSIS — D239 Other benign neoplasm of skin, unspecified: Secondary | ICD-10-CM | POA: Diagnosis not present

## 2023-10-23 DIAGNOSIS — M25552 Pain in left hip: Secondary | ICD-10-CM | POA: Diagnosis not present

## 2024-03-05 DIAGNOSIS — Z681 Body mass index (BMI) 19 or less, adult: Secondary | ICD-10-CM | POA: Diagnosis not present

## 2024-03-05 DIAGNOSIS — M150 Primary generalized (osteo)arthritis: Secondary | ICD-10-CM | POA: Diagnosis not present

## 2024-03-05 DIAGNOSIS — E2839 Other primary ovarian failure: Secondary | ICD-10-CM | POA: Diagnosis not present

## 2024-03-05 DIAGNOSIS — J449 Chronic obstructive pulmonary disease, unspecified: Secondary | ICD-10-CM | POA: Diagnosis not present

## 2024-03-05 DIAGNOSIS — G43909 Migraine, unspecified, not intractable, without status migrainosus: Secondary | ICD-10-CM | POA: Diagnosis not present

## 2024-03-13 DIAGNOSIS — M79641 Pain in right hand: Secondary | ICD-10-CM | POA: Diagnosis not present

## 2024-03-13 DIAGNOSIS — E559 Vitamin D deficiency, unspecified: Secondary | ICD-10-CM | POA: Diagnosis not present

## 2024-03-13 DIAGNOSIS — E785 Hyperlipidemia, unspecified: Secondary | ICD-10-CM | POA: Diagnosis not present

## 2024-03-13 DIAGNOSIS — M79642 Pain in left hand: Secondary | ICD-10-CM | POA: Diagnosis not present

## 2024-03-13 DIAGNOSIS — M810 Age-related osteoporosis without current pathological fracture: Secondary | ICD-10-CM | POA: Diagnosis not present

## 2024-03-13 DIAGNOSIS — Z7689 Persons encountering health services in other specified circumstances: Secondary | ICD-10-CM | POA: Diagnosis not present

## 2024-03-13 DIAGNOSIS — G4700 Insomnia, unspecified: Secondary | ICD-10-CM | POA: Diagnosis not present

## 2024-03-13 DIAGNOSIS — F5101 Primary insomnia: Secondary | ICD-10-CM | POA: Diagnosis not present

## 2024-03-13 DIAGNOSIS — G43009 Migraine without aura, not intractable, without status migrainosus: Secondary | ICD-10-CM | POA: Diagnosis not present

## 2024-03-31 DIAGNOSIS — M67441 Ganglion, right hand: Secondary | ICD-10-CM | POA: Diagnosis not present

## 2024-03-31 DIAGNOSIS — Z9889 Other specified postprocedural states: Secondary | ICD-10-CM | POA: Diagnosis not present

## 2024-03-31 DIAGNOSIS — M25531 Pain in right wrist: Secondary | ICD-10-CM | POA: Diagnosis not present

## 2024-03-31 DIAGNOSIS — M19041 Primary osteoarthritis, right hand: Secondary | ICD-10-CM | POA: Diagnosis not present

## 2024-03-31 DIAGNOSIS — S63121A Subluxation of unspecified interphalangeal joint of right thumb, initial encounter: Secondary | ICD-10-CM | POA: Diagnosis not present

## 2024-04-14 ENCOUNTER — Other Ambulatory Visit: Payer: Self-pay | Admitting: Obstetrics and Gynecology

## 2024-04-14 DIAGNOSIS — Z1231 Encounter for screening mammogram for malignant neoplasm of breast: Secondary | ICD-10-CM

## 2024-04-22 DIAGNOSIS — Z7689 Persons encountering health services in other specified circumstances: Secondary | ICD-10-CM | POA: Diagnosis not present

## 2024-04-25 ENCOUNTER — Other Ambulatory Visit: Payer: Self-pay | Admitting: Medical Genetics

## 2024-05-02 ENCOUNTER — Ambulatory Visit
Admission: RE | Admit: 2024-05-02 | Discharge: 2024-05-02 | Disposition: A | Discharge status: Home / Self Care | Source: Ambulatory Visit | Attending: Obstetrics and Gynecology | Admitting: Obstetrics and Gynecology

## 2024-05-02 DIAGNOSIS — Z1231 Encounter for screening mammogram for malignant neoplasm of breast: Secondary | ICD-10-CM | POA: Diagnosis not present

## 2024-05-08 ENCOUNTER — Other Ambulatory Visit (HOSPITAL_COMMUNITY)
Admission: RE | Admit: 2024-05-08 | Discharge: 2024-05-08 | Disposition: A | Discharge status: Home / Self Care | Payer: Self-pay | Source: Ambulatory Visit | Attending: Medical Genetics | Admitting: Medical Genetics

## 2024-05-16 LAB — GENECONNECT MOLECULAR SCREEN: Genetic Analysis Overall Interpretation: NEGATIVE

## 2024-05-22 DIAGNOSIS — M79641 Pain in right hand: Secondary | ICD-10-CM | POA: Diagnosis not present

## 2024-05-22 DIAGNOSIS — Z87891 Personal history of nicotine dependence: Secondary | ICD-10-CM | POA: Diagnosis not present

## 2024-05-22 DIAGNOSIS — M79642 Pain in left hand: Secondary | ICD-10-CM | POA: Diagnosis not present

## 2024-05-22 DIAGNOSIS — G43009 Migraine without aura, not intractable, without status migrainosus: Secondary | ICD-10-CM | POA: Diagnosis not present

## 2024-05-22 DIAGNOSIS — F17211 Nicotine dependence, cigarettes, in remission: Secondary | ICD-10-CM | POA: Diagnosis not present

## 2024-05-22 DIAGNOSIS — G4700 Insomnia, unspecified: Secondary | ICD-10-CM | POA: Diagnosis not present

## 2024-05-22 DIAGNOSIS — F5101 Primary insomnia: Secondary | ICD-10-CM | POA: Diagnosis not present

## 2024-05-22 DIAGNOSIS — Z122 Encounter for screening for malignant neoplasm of respiratory organs: Secondary | ICD-10-CM | POA: Diagnosis not present

## 2024-05-22 DIAGNOSIS — E785 Hyperlipidemia, unspecified: Secondary | ICD-10-CM | POA: Diagnosis not present

## 2024-05-22 DIAGNOSIS — M810 Age-related osteoporosis without current pathological fracture: Secondary | ICD-10-CM | POA: Diagnosis not present

## 2024-05-22 DIAGNOSIS — Z0001 Encounter for general adult medical examination with abnormal findings: Secondary | ICD-10-CM | POA: Diagnosis not present
